# Patient Record
Sex: Female | Born: 1940 | Race: White | Hispanic: No | Marital: Married | Smoking: Former smoker
Health system: Southern US, Community
[De-identification: ages and names within clinical notes are randomized; demographics above are authoritative.]

## PROBLEM LIST (undated history)

## (undated) DIAGNOSIS — G912 (Idiopathic) normal pressure hydrocephalus: Secondary | ICD-10-CM

## (undated) DIAGNOSIS — R51 Headache: Secondary | ICD-10-CM

## (undated) DIAGNOSIS — M069 Rheumatoid arthritis, unspecified: Secondary | ICD-10-CM

## (undated) DIAGNOSIS — G4486 Cervicogenic headache: Secondary | ICD-10-CM

## (undated) DIAGNOSIS — R269 Unspecified abnormalities of gait and mobility: Principal | ICD-10-CM

## (undated) DIAGNOSIS — R42 Dizziness and giddiness: Secondary | ICD-10-CM

## (undated) DIAGNOSIS — F32A Depression, unspecified: Secondary | ICD-10-CM

## (undated) DIAGNOSIS — E785 Hyperlipidemia, unspecified: Secondary | ICD-10-CM

## (undated) DIAGNOSIS — M5481 Occipital neuralgia: Secondary | ICD-10-CM

## (undated) DIAGNOSIS — F329 Major depressive disorder, single episode, unspecified: Secondary | ICD-10-CM

## (undated) DIAGNOSIS — M869 Osteomyelitis, unspecified: Secondary | ICD-10-CM

## (undated) DIAGNOSIS — I10 Essential (primary) hypertension: Secondary | ICD-10-CM

## (undated) DIAGNOSIS — M47812 Spondylosis without myelopathy or radiculopathy, cervical region: Secondary | ICD-10-CM

## (undated) HISTORY — DX: Hyperlipidemia, unspecified: E78.5

## (undated) HISTORY — DX: Dizziness and giddiness: R42

## (undated) HISTORY — DX: Headache: R51

## (undated) HISTORY — DX: Unspecified abnormalities of gait and mobility: R26.9

## (undated) HISTORY — PX: LUMBAR SPINE SURGERY: SHX701

## (undated) HISTORY — PX: REPLACEMENT TOTAL KNEE: SUR1224

## (undated) HISTORY — DX: Major depressive disorder, single episode, unspecified: F32.9

## (undated) HISTORY — DX: (Idiopathic) normal pressure hydrocephalus: G91.2

## (undated) HISTORY — DX: Depression, unspecified: F32.A

## (undated) HISTORY — DX: Occipital neuralgia: M54.81

## (undated) HISTORY — PX: OVARY SURGERY: SHX727

## (undated) HISTORY — DX: Cervicogenic headache: G44.86

## (undated) HISTORY — PX: CERVICAL SPINE SURGERY: SHX589

## (undated) HISTORY — DX: Rheumatoid arthritis, unspecified: M06.9

## (undated) HISTORY — PX: VENTRICULO-PERITONEAL SHUNT PLACEMENT / LAPAROSCOPIC INSERTION PERITONEAL CATHETER: SUR1040

## (undated) HISTORY — DX: Essential (primary) hypertension: I10

## (undated) HISTORY — DX: Osteomyelitis, unspecified: M86.9

## (undated) HISTORY — PX: JOINT REPLACEMENT: SHX530

## (undated) HISTORY — DX: Spondylosis without myelopathy or radiculopathy, cervical region: M47.812

---

## 2008-12-01 ENCOUNTER — Ambulatory Visit: Payer: Self-pay | Admitting: Cardiology

## 2008-12-02 ENCOUNTER — Inpatient Hospital Stay (HOSPITAL_COMMUNITY): Admission: EM | Admit: 2008-12-02 | Discharge: 2008-12-14 | Payer: Self-pay | Admitting: Emergency Medicine

## 2008-12-03 ENCOUNTER — Ambulatory Visit: Payer: Self-pay | Admitting: Infectious Diseases

## 2008-12-05 ENCOUNTER — Ambulatory Visit: Payer: Self-pay | Admitting: Physical Medicine & Rehabilitation

## 2008-12-05 ENCOUNTER — Encounter (INDEPENDENT_AMBULATORY_CARE_PROVIDER_SITE_OTHER): Payer: Self-pay | Admitting: Internal Medicine

## 2008-12-06 ENCOUNTER — Encounter (INDEPENDENT_AMBULATORY_CARE_PROVIDER_SITE_OTHER): Payer: Self-pay | Admitting: Neurosurgery

## 2008-12-14 ENCOUNTER — Inpatient Hospital Stay (HOSPITAL_COMMUNITY)
Admission: RE | Admit: 2008-12-14 | Discharge: 2008-12-28 | Payer: Self-pay | Admitting: Physical Medicine & Rehabilitation

## 2008-12-22 ENCOUNTER — Ambulatory Visit: Payer: Self-pay | Admitting: Physical Medicine & Rehabilitation

## 2010-07-08 ENCOUNTER — Encounter: Admission: RE | Admit: 2010-07-08 | Discharge: 2010-07-08 | Payer: Self-pay | Admitting: Neurosurgery

## 2010-07-23 ENCOUNTER — Inpatient Hospital Stay (HOSPITAL_COMMUNITY): Admission: RE | Admit: 2010-07-23 | Discharge: 2010-07-25 | Payer: Self-pay | Admitting: Neurosurgery

## 2010-09-30 ENCOUNTER — Encounter
Admission: RE | Admit: 2010-09-30 | Discharge: 2010-09-30 | Payer: Self-pay | Source: Home / Self Care | Attending: Neurosurgery | Admitting: Neurosurgery

## 2010-12-25 LAB — BASIC METABOLIC PANEL
BUN: 18 mg/dL (ref 6–23)
CO2: 29 mEq/L (ref 19–32)
Calcium: 9.5 mg/dL (ref 8.4–10.5)
Chloride: 102 mEq/L (ref 96–112)
Creatinine, Ser: 1.01 mg/dL (ref 0.4–1.2)
GFR calc Af Amer: >60 mL/min (ref >60)
GFR calc non Af Amer: 54 mL/min — ABNORMAL LOW (ref >60)
Glucose, Bld: 96 mg/dL (ref 70–99)
Potassium: 4.8 mEq/L (ref 3.5–5.1)
Sodium: 138 mEq/L (ref 135–145)

## 2010-12-25 LAB — CBC
HCT: 33.8 % — ABNORMAL LOW (ref 36.0–46.0)
Hemoglobin: 11 g/dL — ABNORMAL LOW (ref 12.0–15.0)
MCH: 28.7 pg (ref 26.0–34.0)
MCHC: 32.5 g/dL (ref 30.0–36.0)
MCV: 88.3 fL (ref 78.0–100.0)
Platelets: 233 10*3/uL (ref 150–400)
RBC: 3.83 MIL/uL — ABNORMAL LOW (ref 3.87–5.11)
RDW: 14.4 % (ref 11.5–15.5)
WBC: 7.6 10*3/uL (ref 4.0–10.5)

## 2010-12-25 LAB — URINE MICROSCOPIC-ADD ON

## 2010-12-25 LAB — URINALYSIS, ROUTINE W REFLEX MICROSCOPIC
Bilirubin Urine: NEGATIVE
Glucose, UA: NEGATIVE mg/dL
Hgb urine dipstick: NEGATIVE
Ketones, ur: NEGATIVE mg/dL
Nitrite: NEGATIVE
Protein, ur: NEGATIVE mg/dL
Specific Gravity, Urine: 1.028 (ref 1.005–1.030)
Urobilinogen, UA: 0.2 mg/dL (ref 0.0–1.0)
pH: 5.5 (ref 5.0–8.0)

## 2010-12-25 LAB — SURGICAL PCR SCREEN
MRSA, PCR: NEGATIVE
Staphylococcus aureus: NEGATIVE

## 2010-12-25 LAB — GLUCOSE, CAPILLARY: Glucose-Capillary: 117 mg/dL — ABNORMAL HIGH (ref 70–99)

## 2011-01-05 ENCOUNTER — Other Ambulatory Visit: Payer: Self-pay | Admitting: Neurosurgery

## 2011-01-05 DIAGNOSIS — M542 Cervicalgia: Secondary | ICD-10-CM

## 2011-01-08 ENCOUNTER — Ambulatory Visit
Admission: RE | Admit: 2011-01-08 | Discharge: 2011-01-08 | Disposition: A | Discharge status: Home / Self Care | Payer: Medicare Other | Source: Ambulatory Visit | Attending: Neurosurgery | Admitting: Neurosurgery

## 2011-01-08 DIAGNOSIS — M542 Cervicalgia: Secondary | ICD-10-CM

## 2011-01-13 ENCOUNTER — Other Ambulatory Visit: Payer: Self-pay | Admitting: Orthopedic Surgery

## 2011-01-13 ENCOUNTER — Encounter (HOSPITAL_COMMUNITY): Payer: Medicare Other | Attending: Orthopedic Surgery

## 2011-01-13 DIAGNOSIS — Z79899 Other long term (current) drug therapy: Secondary | ICD-10-CM | POA: Insufficient documentation

## 2011-01-13 DIAGNOSIS — Z01812 Encounter for preprocedural laboratory examination: Secondary | ICD-10-CM | POA: Insufficient documentation

## 2011-01-13 DIAGNOSIS — I10 Essential (primary) hypertension: Secondary | ICD-10-CM | POA: Insufficient documentation

## 2011-01-13 DIAGNOSIS — Y831 Surgical operation with implant of artificial internal device as the cause of abnormal reaction of the patient, or of later complication, without mention of misadventure at the time of the procedure: Secondary | ICD-10-CM | POA: Insufficient documentation

## 2011-01-13 DIAGNOSIS — E785 Hyperlipidemia, unspecified: Secondary | ICD-10-CM | POA: Insufficient documentation

## 2011-01-13 DIAGNOSIS — M069 Rheumatoid arthritis, unspecified: Secondary | ICD-10-CM | POA: Insufficient documentation

## 2011-01-13 DIAGNOSIS — T8450XA Infection and inflammatory reaction due to unspecified internal joint prosthesis, initial encounter: Secondary | ICD-10-CM | POA: Insufficient documentation

## 2011-01-13 LAB — URINALYSIS, ROUTINE W REFLEX MICROSCOPIC
Bilirubin Urine: NEGATIVE
Glucose, UA: NEGATIVE mg/dL
Ketones, ur: NEGATIVE mg/dL
Nitrite: NEGATIVE
Protein, ur: NEGATIVE mg/dL
Specific Gravity, Urine: 1.027 (ref 1.005–1.030)
pH: 6 (ref 5.0–8.0)

## 2011-01-13 LAB — COMPREHENSIVE METABOLIC PANEL
AST: 15 U/L (ref 0–37)
Albumin: 3 g/dL — ABNORMAL LOW (ref 3.5–5.2)
Alkaline Phosphatase: 91 U/L (ref 39–117)
CO2: 29 mEq/L (ref 19–32)
Chloride: 102 mEq/L (ref 96–112)
Creatinine, Ser: 0.8 mg/dL (ref 0.4–1.2)
GFR calc Af Amer: >60 mL/min (ref >60)
GFR calc non Af Amer: >60 mL/min (ref >60)
Glucose, Bld: 97 mg/dL (ref 70–99)
Potassium: 4.6 mEq/L (ref 3.5–5.1)
Total Bilirubin: 0.5 mg/dL (ref 0.3–1.2)
Total Protein: 6.8 g/dL (ref 6.0–8.3)

## 2011-01-13 LAB — CBC
HCT: 33.4 % — ABNORMAL LOW (ref 36.0–46.0)
Hemoglobin: 10.1 g/dL — ABNORMAL LOW (ref 12.0–15.0)
MCH: 25 pg — ABNORMAL LOW (ref 26.0–34.0)
MCHC: 30.2 g/dL (ref 30.0–36.0)
MCV: 82.7 fL (ref 78.0–100.0)
RBC: 4.04 MIL/uL (ref 3.87–5.11)
RDW: 15.3 % (ref 11.5–15.5)

## 2011-01-13 LAB — SURGICAL PCR SCREEN
MRSA, PCR: NEGATIVE
Staphylococcus aureus: NEGATIVE

## 2011-01-13 LAB — URINE MICROSCOPIC-ADD ON

## 2011-01-13 LAB — APTT: aPTT: 32 seconds (ref 24–37)

## 2011-01-13 LAB — PROTIME-INR: INR: 1.08 (ref 0.00–1.49)

## 2011-01-21 ENCOUNTER — Inpatient Hospital Stay (HOSPITAL_COMMUNITY)
Admission: RE | Admit: 2011-01-21 | Discharge: 2011-01-26 | DRG: 464 | Disposition: A | Discharge status: Skilled Nursing Facility | Payer: Medicare Other | Source: Ambulatory Visit | Attending: Orthopedic Surgery | Admitting: Orthopedic Surgery

## 2011-01-21 DIAGNOSIS — Z982 Presence of cerebrospinal fluid drainage device: Secondary | ICD-10-CM

## 2011-01-21 DIAGNOSIS — G912 (Idiopathic) normal pressure hydrocephalus: Secondary | ICD-10-CM | POA: Diagnosis present

## 2011-01-21 DIAGNOSIS — E7800 Pure hypercholesterolemia, unspecified: Secondary | ICD-10-CM | POA: Diagnosis present

## 2011-01-21 DIAGNOSIS — I10 Essential (primary) hypertension: Secondary | ICD-10-CM | POA: Diagnosis present

## 2011-01-21 DIAGNOSIS — Z96659 Presence of unspecified artificial knee joint: Secondary | ICD-10-CM

## 2011-01-21 DIAGNOSIS — IMO0002 Reserved for concepts with insufficient information to code with codable children: Secondary | ICD-10-CM | POA: Diagnosis present

## 2011-01-21 DIAGNOSIS — Z9889 Other specified postprocedural states: Secondary | ICD-10-CM

## 2011-01-21 DIAGNOSIS — B951 Streptococcus, group B, as the cause of diseases classified elsewhere: Secondary | ICD-10-CM | POA: Diagnosis present

## 2011-01-21 DIAGNOSIS — Y831 Surgical operation with implant of artificial internal device as the cause of abnormal reaction of the patient, or of later complication, without mention of misadventure at the time of the procedure: Secondary | ICD-10-CM | POA: Diagnosis present

## 2011-01-21 DIAGNOSIS — T8450XA Infection and inflammatory reaction due to unspecified internal joint prosthesis, initial encounter: Principal | ICD-10-CM | POA: Diagnosis present

## 2011-01-21 DIAGNOSIS — D62 Acute posthemorrhagic anemia: Secondary | ICD-10-CM | POA: Diagnosis not present

## 2011-01-21 DIAGNOSIS — M069 Rheumatoid arthritis, unspecified: Secondary | ICD-10-CM | POA: Diagnosis present

## 2011-01-21 LAB — CBC
HCT: 27.7 % — ABNORMAL LOW (ref 36.0–46.0)
Hemoglobin: 9 g/dL — ABNORMAL LOW (ref 12.0–15.0)
MCH: 26.1 pg (ref 26.0–34.0)
MCV: 80.3 fL (ref 78.0–100.0)
RBC: 3.45 MIL/uL — ABNORMAL LOW (ref 3.87–5.11)
WBC: 16.3 10*3/uL — ABNORMAL HIGH (ref 4.0–10.5)

## 2011-01-22 ENCOUNTER — Inpatient Hospital Stay (HOSPITAL_COMMUNITY): Payer: Medicare Other

## 2011-01-22 DIAGNOSIS — T8450XA Infection and inflammatory reaction due to unspecified internal joint prosthesis, initial encounter: Secondary | ICD-10-CM

## 2011-01-22 LAB — BASIC METABOLIC PANEL
BUN: 11 mg/dL (ref 6–23)
BUN: 27 mg/dL — ABNORMAL HIGH (ref 6–23)
CO2: 24 mEq/L (ref 19–32)
CO2: 29 mEq/L (ref 19–32)
CO2: 30 mEq/L (ref 19–32)
Calcium: 8.2 mg/dL — ABNORMAL LOW (ref 8.4–10.5)
Calcium: 8.5 mg/dL (ref 8.4–10.5)
Calcium: 9 mg/dL (ref 8.4–10.5)
Chloride: 99 mEq/L (ref 96–112)
Chloride: 97 mEq/L (ref 96–112)
Creatinine, Ser: 0.79 mg/dL (ref 0.4–1.2)
Creatinine, Ser: 0.95 mg/dL (ref 0.4–1.2)
GFR calc Af Amer: >60 mL/min (ref >60)
GFR calc Af Amer: >60 mL/min (ref >60)
GFR calc non Af Amer: >60 mL/min (ref >60)
GFR calc non Af Amer: >60 mL/min (ref >60)
Glucose, Bld: 179 mg/dL — ABNORMAL HIGH (ref 70–99)
Glucose, Bld: 124 mg/dL — ABNORMAL HIGH (ref 70–99)
Sodium: 135 mEq/L (ref 135–145)
Sodium: 137 mEq/L (ref 135–145)
Sodium: 138 mEq/L (ref 135–145)

## 2011-01-22 LAB — URINE CULTURE
Colony Count: NO GROWTH
Culture: NO GROWTH
Special Requests: NEGATIVE

## 2011-01-22 LAB — CBC
HCT: 27 % — ABNORMAL LOW (ref 36.0–46.0)
HCT: 23 % — ABNORMAL LOW (ref 36.0–46.0)
Hemoglobin: 8.5 g/dL — ABNORMAL LOW (ref 12.0–15.0)
Hemoglobin: 10.4 g/dL — ABNORMAL LOW (ref 12.0–15.0)
Hemoglobin: 10.5 g/dL — ABNORMAL LOW (ref 12.0–15.0)
Hemoglobin: 7.6 g/dL — CL (ref 12.0–15.0)
Hemoglobin: 8.5 g/dL — ABNORMAL LOW (ref 12.0–15.0)
MCH: 25.4 pg — ABNORMAL LOW (ref 26.0–34.0)
MCHC: 31.5 g/dL (ref 30.0–36.0)
MCHC: 33.2 g/dL (ref 30.0–36.0)
MCHC: 34.1 g/dL (ref 30.0–36.0)
MCHC: 34.5 g/dL (ref 30.0–36.0)
MCHC: 34.7 g/dL (ref 30.0–36.0)
MCV: 80.6 fL (ref 78.0–100.0)
MCV: 93.7 fL (ref 78.0–100.0)
MCV: 92 fL (ref 78.0–100.0)
MCV: 92.2 fL (ref 78.0–100.0)
MCV: 96.8 fL (ref 78.0–100.0)
Platelets: 151 10*3/uL (ref 150–400)
RBC: 2.38 MIL/uL — ABNORMAL LOW (ref 3.87–5.11)
RBC: 2.6 MIL/uL — ABNORMAL LOW (ref 3.87–5.11)
RBC: 3.28 MIL/uL — ABNORMAL LOW (ref 3.87–5.11)
RDW: 16.1 % — ABNORMAL HIGH (ref 11.5–15.5)
WBC: 11.3 10*3/uL — ABNORMAL HIGH (ref 4.0–10.5)
WBC: 10.2 10*3/uL (ref 4.0–10.5)
WBC: 14.3 10*3/uL — ABNORMAL HIGH (ref 4.0–10.5)

## 2011-01-22 LAB — DIFFERENTIAL
Basophils Relative: 0 % (ref 0–1)
Eosinophils Absolute: 0 10*3/uL (ref 0.0–0.7)
Eosinophils Relative: 0 % (ref 0–5)
Monocytes Absolute: 0.5 10*3/uL (ref 0.1–1.0)
Monocytes Relative: 5 % (ref 3–12)

## 2011-01-22 LAB — CROSSMATCH

## 2011-01-23 LAB — PROTIME-INR: Prothrombin Time: 17.1 seconds — ABNORMAL HIGH (ref 11.6–15.2)

## 2011-01-23 LAB — TYPE AND SCREEN: Unit division: 0

## 2011-01-23 LAB — BASIC METABOLIC PANEL
BUN: 9 mg/dL (ref 6–23)
CO2: 27 mEq/L (ref 19–32)
Calcium: 8.2 mg/dL — ABNORMAL LOW (ref 8.4–10.5)
Creatinine, Ser: 0.75 mg/dL (ref 0.4–1.2)
GFR calc non Af Amer: >60 mL/min (ref >60)
Glucose, Bld: 141 mg/dL — ABNORMAL HIGH (ref 70–99)
Sodium: 135 mEq/L (ref 135–145)

## 2011-01-23 LAB — CBC
MCH: 27.1 pg (ref 26.0–34.0)
MCHC: 32.7 g/dL (ref 30.0–36.0)
MCV: 83 fL (ref 78.0–100.0)
Platelets: 222 10*3/uL (ref 150–400)
RBC: 3.76 MIL/uL — ABNORMAL LOW (ref 3.87–5.11)
RDW: 16.3 % — ABNORMAL HIGH (ref 11.5–15.5)

## 2011-01-23 NOTE — Op Note (Signed)
[name], [name]                ACCOUNT NO.:  [id number]  MEDICAL RECORD NO.:  [id number]           PATIENT TYPE:  I  LOCATION:  [id number]                         FACILITY:  [facility]  PHYSICIAN:  [name], M.D.    DATE OF BIRTH:  09-14-1941  DATE OF PROCEDURE:  01/21/2011 DATE OF DISCHARGE:                              OPERATIVE REPORT   PREOPERATIVE DIAGNOSES: 1. Infected left total knee arthroplasty. 2. Retained intramedullary nail, left femur.  POSTOPERATIVE DIAGNOSES: 1. Infected left total knee arthroplasty. 2. Infected intramedullary nail, left femur.  PROCEDURE: 1. Resection arthroplasty of left knee with placement of antibiotic-     impregnated spacer. 2. Removal of IM nail, left femur.  SURGEON:  [name], MD  ASSISTANT:  Alexzandrew L. Dara Lords, PA-C  ANESTHESIA:  General.  ESTIMATED BLOOD LOSS:  200.  DRAIN:  Hemovac x1 to the left knee and 1 to the left hip.  TOURNIQUET TIME:  42 minutes at 300 mmHg for the left knee procedure.  COMPLICATIONS:  None.  CONDITION:  Stable to Recovery.  BRIEF CLINICAL NOTE:  [name] is a 70 year old female with complex history in regard to her left knee.  She had left total knee arthroplasty done in [place] several years ago.  She ended up developing an infection approximately year and half ago and initially had an I and D and liner exchange.  She eventually had apparent resolution of the infection, but then upon moving to [place] had recurrence of drainage from the knee last fall.  She had a superficial I and D and then had a deep I and D of the joint in January of this year. She presented to me in second opinion earlier this year and I felt that the joint was in fact that needed resection arthroplasty.  She presents now for that operation.  In addition, she had a previous femur fracture and had a long antegrade femoral nail going down to her knee.  I felt that it was essential to remove this nail also  for the purpose of eradicating infection and also in order to allow [place] to place intramedullary rod in her canal and we did a reimplantation.  PROCEDURE IN DETAIL:  After successful administration of general anesthetic, a tourniquet was placed high on her left thigh and her left lower extremity was prepped and draped in usual sterile fashion. Extremity was wrapped in an Esmarch and tourniquet inflated to 300 mmHg. Midline incision was made with a 10 blade through subcutaneous tissue to the level of the extensor mechanism.  Fresh blade was used to make a medial parapatellar arthrotomy.  Puss was encountered in the joint.  The specimen was sent for Gram stain culture and sensitivity.  I did a thorough debridement of the inflamed synovium.  We recreated median and lateral gutters.  There was a large amount of pus present in a pocket in the medial gutter, adjacent to the interlock from her IM nail. Laterally, she also had a small plate with 2 screws and there was a lot of pus adjacent to that plate.  We removed the plate and the  2 screws. We also removed the anterior and lateral interlocks of the IM nail.  I then flexed the knee to address the knee.  Prior to this, I had elevated soft tissue on the proximal medial tibia into the semimembranosus bursa. Laterally, I also elevated tissue with attention being paid to avoid any patellar tendon on tibial tubercle.  Patella was subluxed laterally and the knee flexed 90 degrees.  The tibial polyethylene was removed. Posterior, there was a fair amount of debris and that was all debrided. We then used an osteotome to disrupt the interface between the femoral component and bone and this was easily removed, consistent with a femoral component being loose.  I then subluxed tibial forward, placed retractors, and used a saw and osteotome to disrupt interface between the tibial component and bone.  This was also removed relatively easily, but it was not  grossly loose.  We then removed the cement from the tibial side.  The tibial bone looked healthy.  Femoral side had some metal staining on the bone and that was removed.  There was pus tracking down the I and M nail.  This was all thoroughly debrided with thorough synovectomy performed.  The 3 batches of gentamicin-impregnated cement was then mixed and while that was occurring, the wound was copiously irrigated with 3 L saline and pulsatile lavaged.  I also removed the polyethylene patella by utilizing a saw to disrupt the undersurface and then removing the plastic pegs with a rongeur.  There was some granulation tissue present in the bone and that was removed.  I completed irrigating with the saline and then once the cement was ready for molding, the spacer was molded to fit the extension space of the knee.  As the cement hardened, I placed it into the extension space and a very good varus-valgus stability.  The wound was further irrigated with saline and then the arthrotomy closed over Hemovac drain with interrupted #1 PDS.  The tourniquet was then released at total time of 42 minutes.  Subcutaneous was then closed with interrupted 2-0 Vicryl and skin with staples.  The drain was hooked to suction, incision cleaned and dried, and a bulky sterile dressing applied.  We then broke down the drapes and placed her in a semi-lateral position with a bump under her hip.  I isolated the bandage by placing a stockinette over and then we prepped and draped the thigh in usual sterile fashion.  This was very prepped like a total hip so we would have free movement of the leg. Previous lateral incision was then utilized, skin cut with a 10 blade through subcutaneous tissue.  The incision was made posterolaterally over the hip.  We cut through the fascia lata to the lateral cortex of the hip and the greater trochanter. I then palpated the proximal interlock screw and it was removed.  The bone at the  greater trochanter was overgrown where the nail was inserted.  I was able to remove that with a rongeur and gain access to the nail.  We then threaded in the Winquist extraction device and we were able to remove the nail utilizing an extraction mallet.  There was puss tracking out the canal as the nail was being removed.  Once that was out, then we thoroughly irrigated the canal with about a liter of saline solution.  The wound was irrigated with another liter saline. The fascia lata was then closed over Hemovac drain with interrupted #1 Vicryl, subcutaneous closed with #1-0  and #2-0 Vicryl, and skin closed with staples.  The incision was cleaned and dried and a bulky sterile dressing applied.  She was then placed into a knee immobilizer, awakened, and transferred to Recovery in stable condition.     [name], M.D.     FA/MEDQ  D:  01/21/2011  T:  01/22/2011  Job:  [id number]  Electronically Signed by [name] M.D. on 01/23/2011 09:02:45 AM

## 2011-01-24 LAB — CBC
MCH: 27.7 pg (ref 26.0–34.0)
MCHC: 33 g/dL (ref 30.0–36.0)
Platelets: 202 10*3/uL (ref 150–400)
RDW: 16.6 % — ABNORMAL HIGH (ref 11.5–15.5)

## 2011-01-24 LAB — PROTIME-INR
INR: 1.69 — ABNORMAL HIGH (ref 0.00–1.49)
Prothrombin Time: 20.1 seconds — ABNORMAL HIGH (ref 11.6–15.2)

## 2011-01-26 LAB — ANAEROBIC CULTURE

## 2011-01-26 LAB — PROTIME-INR: Prothrombin Time: 20.8 seconds — ABNORMAL HIGH (ref 11.6–15.2)

## 2011-01-27 LAB — URINALYSIS, ROUTINE W REFLEX MICROSCOPIC
Glucose, UA: NEGATIVE mg/dL
Protein, ur: >300 mg/dL — AB

## 2011-01-27 LAB — BASIC METABOLIC PANEL
BUN: 30 mg/dL — ABNORMAL HIGH (ref 6–23)
CO2: 24 mEq/L (ref 19–32)
CO2: 25 mEq/L (ref 19–32)
Calcium: 8.4 mg/dL (ref 8.4–10.5)
Chloride: 100 mEq/L (ref 96–112)
Chloride: 97 mEq/L (ref 96–112)
Creatinine, Ser: 0.74 mg/dL (ref 0.4–1.2)
GFR calc Af Amer: >60 mL/min (ref >60)
GFR calc non Af Amer: >60 mL/min (ref >60)
GFR calc non Af Amer: >60 mL/min (ref >60)
Glucose, Bld: 141 mg/dL — ABNORMAL HIGH (ref 70–99)
Potassium: 4.4 mEq/L (ref 3.5–5.1)
Potassium: 4.8 mEq/L (ref 3.5–5.1)
Sodium: 133 mEq/L — ABNORMAL LOW (ref 135–145)
Sodium: 135 mEq/L (ref 135–145)

## 2011-01-27 LAB — CULTURE, BLOOD (ROUTINE X 2)
Culture: NO GROWTH
Culture: NO GROWTH
Culture: NO GROWTH

## 2011-01-27 LAB — CBC
HCT: 32 % — ABNORMAL LOW (ref 36.0–46.0)
HCT: 32.3 % — ABNORMAL LOW (ref 36.0–46.0)
HCT: 33.3 % — ABNORMAL LOW (ref 36.0–46.0)
Hemoglobin: 11 g/dL — ABNORMAL LOW (ref 12.0–15.0)
Hemoglobin: 11.4 g/dL — ABNORMAL LOW (ref 12.0–15.0)
Hemoglobin: 11.9 g/dL — ABNORMAL LOW (ref 12.0–15.0)
MCHC: 34.3 g/dL (ref 30.0–36.0)
MCHC: 34.4 g/dL (ref 30.0–36.0)
MCHC: 34.5 g/dL (ref 30.0–36.0)
MCHC: 34.8 g/dL (ref 30.0–36.0)
MCV: 94 fL (ref 78.0–100.0)
MCV: 94.3 fL (ref 78.0–100.0)
MCV: 94.6 fL (ref 78.0–100.0)
MCV: 94.7 fL (ref 78.0–100.0)
MCV: 95.9 fL (ref 78.0–100.0)
Platelets: 110 10*3/uL — ABNORMAL LOW (ref 150–400)
Platelets: 125 10*3/uL — ABNORMAL LOW (ref 150–400)
RBC: 3.43 MIL/uL — ABNORMAL LOW (ref 3.87–5.11)
RBC: 3.47 MIL/uL — ABNORMAL LOW (ref 3.87–5.11)
RBC: 3.65 MIL/uL — ABNORMAL LOW (ref 3.87–5.11)
RDW: 13.7 % (ref 11.5–15.5)
RDW: 13.8 % (ref 11.5–15.5)
RDW: 14 % (ref 11.5–15.5)
WBC: 18.6 10*3/uL — ABNORMAL HIGH (ref 4.0–10.5)
WBC: 9.3 10*3/uL (ref 4.0–10.5)

## 2011-01-27 LAB — COMPREHENSIVE METABOLIC PANEL
ALT: 38 U/L — ABNORMAL HIGH (ref 0–35)
AST: 27 U/L (ref 0–37)
AST: 31 U/L (ref 0–37)
Albumin: 2.2 g/dL — ABNORMAL LOW (ref 3.5–5.2)
CO2: 25 mEq/L (ref 19–32)
Calcium: 8.9 mg/dL (ref 8.4–10.5)
Calcium: 9.6 mg/dL (ref 8.4–10.5)
Creatinine, Ser: 1.03 mg/dL (ref 0.4–1.2)
GFR calc Af Amer: >60 mL/min (ref >60)
GFR calc Af Amer: >60 mL/min (ref >60)
GFR calc non Af Amer: 53 mL/min — ABNORMAL LOW (ref >60)
Sodium: 135 mEq/L (ref 135–145)
Total Protein: 6 g/dL (ref 6.0–8.3)
Total Protein: 6 g/dL (ref 6.0–8.3)

## 2011-01-27 LAB — CULTURE, ROUTINE-ABSCESS

## 2011-01-27 LAB — HIV ANTIBODY (ROUTINE TESTING W REFLEX): HIV: NONREACTIVE

## 2011-01-27 LAB — SEDIMENTATION RATE: Sed Rate: 100 mm/hr — ABNORMAL HIGH (ref 0–22)

## 2011-01-27 LAB — URINE MICROSCOPIC-ADD ON

## 2011-01-27 LAB — PROTIME-INR: INR: 1.1 (ref 0.00–1.49)

## 2011-01-27 LAB — URINE CULTURE: Culture: NO GROWTH

## 2011-01-27 LAB — ANAEROBIC CULTURE

## 2011-01-27 LAB — TSH: TSH: 1.117 u[IU]/mL (ref 0.350–4.500)

## 2011-01-27 LAB — APTT: aPTT: 34 seconds (ref 24–37)

## 2011-02-09 NOTE — H&P (Signed)
NAMEJENILLE, [name]                ACCOUNT NO.:  [id number]  MEDICAL RECORD NO.:  [id number]           PATIENT TYPE:  I  LOCATION:  [id number]                         FACILITY:  [facility]  PHYSICIAN:  [name], M.D.    DATE OF BIRTH:  1941-08-10  DATE OF ADMISSION:  01/21/2011 DATE OF DISCHARGE:                             HISTORY & PHYSICAL   CHIEF COMPLAINT:  Infected left total knee.  HISTORY OF PRESENT ILLNESS:  The patient is a 70 year old female who has been seen as a second opinion by Dr. [name] for complicated history and course associated with her left knee.  She is actually the mother of Dr. Lajean Saver, one of our ER Department physicians.  She came in for opinion of her left knee.  Her history is extensive dating back to 2005 back in [place] where she had her original left total knee arthroplasty done.  Unfortunately, she sustained a femur fracture and had a left femoral nailing back in 2007.  It appears that she had a nonunion and had a redo nailing done in December 2008.  Her course even became more complicated when she developed a cervical abscess in and about her neck requiring I and D and surgical fixation.  At that point, records show that she had a group B strep agalactiae with bacteremia. Unfortunately that progressed and infected the total knee and hardware. She underwent irrigation and debridement of the left knee in August 2010 with repeat I and D in March 2011 and a repeat I and D in January 2012. Unfortunately, she continues to have problems with the wound and drainage.  She is seen by Dr. Wynelle Link and felt that she has a grossly infected left total knee arthroplasty.  Felt she would best be served by undergoing removal of the hardware including the total knee, the small plate laterally, and also the antegrade femoral nail for resection and placement of antibiotic spacer.  Risks and benefits of this treatment plan, stage II resection, surgery discussed  with the patient.  She elects to proceed with surgery.  ALLERGIES:  PENICILLIN, SULFA, and METHOTREXATE.  CURRENT MEDICATIONS:  Amlodipine, labetalol, multivitamin, biotin, Ambien, Remeron, Skelaxin, VESIcare, Percocet, pantoprazole, Boniva, Cymbalta.  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  PAST SURGICAL HISTORY:  Lumbar laminectomy in 1989, oophorectomy in 2003, left total knee prosthesis in 2005, femoral rodding of the fracture in the left in 2007 with a redo insertion in left femur in December 2008, cervical laminectomy in February 2010.  By her record, she underwent an I and D of the knee in August 2010 and I and D of the knee in March 2011, I and D of the knee in 2012 of January.  FAMILY HISTORY:  Father with heart failure.  Mother with ovarian cancer. Brother with a brain tumor.  SOCIAL HISTORY:  Married, retired Therapist, sports, past smoker.  No alcohol.  She does have a caregiver lined up.  She does want to look into a [facility] rehab  following surgery.  She does have a living will, healthcare power of attorney.  REVIEW OF SYSTEMS:  GENERAL:  No fevers, chills, night sweats.  NEURO: She has had some occasional headaches.  She has normal pressure hydrocephalus with some balance issues.  RESPIRATORY:  No shortness breath, productive cough, or hemoptysis.  CARDIOVASCULAR:  Chest pain. No orthopnea.  GASTROINTESTINAL:  No nausea, vomiting, diarrhea, constipation.  GENITOURINARY:  No dysuria, hematuria, or discharge. MUSCULOSKELETAL:  Knee pain.  PHYSICAL EXAMINATION:  VITAL SIGNS:  Pulse 68, respirations 12, blood pressure 132/72. GENERAL:  A 70 year old white female, well nourished, well developed, no acute stress.  She is alert, oriented, and cooperative.  She is accompanied by her  husband.  She is a good historian. HEENT:  Normocephalic, atraumatic.  Pupils are round and reactive.  EOMs intact. NECK:  Supple. CHEST:  Clear. HEART:  Regular rate and rhythm without murmur.  S1, S2 noted. ABDOMEN:  Soft, nontender.  Bowel sounds present. RECTAL/BREASTS/GENITALIA:  Not done, not part of present illness. EXTREMITIES:  Left knee, no effusion.  Range of motion 5-100.  No instability.  She does have a small area on the anterolateral portion, just lateral to the incision, looks like a previous subcutaneous abscess there with some intermittent drainage.  IMPRESSION:  Infected left total knee arthroplasty.  PLAN:  The patient was admitted to [facility] to undergo resection arthroplasty, removal of all of her hardware, and surgery will be performed by Dr. [name].  We will get infectious disease consult postoperatively.     Alexzandrew L. Dara Lords, P.A.C.   ______________________________ [name], M.D.    ALP/MEDQ  D:  01/22/2011  T:  01/22/2011  Job:  [id number]  cc:   [name]. [name], M.D. Fax: [phone]  Rita Ohara Fax: [phone]  Electronically Signed by Mickel Crow P.A.C. on 01/28/2011 12:48:01 PM Electronically Signed by [name] M.D. on 02/09/2011 07:10:49 AM

## 2011-02-09 NOTE — Discharge Summary (Signed)
[name], DEJOSEPH                ACCOUNT NO.:  [id number]  MEDICAL RECORD NO.:  [id number]           PATIENT TYPE:  I  LOCATION:  [id number]                         FACILITY:  [facility]  PHYSICIAN:  [name], M.D.    DATE OF BIRTH:  1941-09-21  DATE OF ADMISSION:  01/21/2011 DATE OF DISCHARGE:  01/26/2011                        DISCHARGE SUMMARY - REFERRING   ADMITTING DIAGNOSES: 1. Infected left total knee arthroplasty. 2. Normal-pressure hydrocephalus with previous shunt. 3. History of depression. 4. Hypertension. 5. Hypercholesterolemia. 6. Previous history of left femur fracture in 2007 requiring surgical     fixation. 7. Rheumatoid arthritis. 8. Degenerative disk disease.  DISCHARGE DIAGNOSES: 1. Infected left total knee arthroplasty status post resection     arthroplasty of left knee, replacement of antibiotic impregnated     spacer. 2. Retained intramedullary nailing of left femur status post removal     of intramedullary nailing, left femur. 3. Postop acute blood loss anemia. 4. Status post transfusion without sequelae. 5. Normal-pressure hydrocephalus with previous shunt. 6. History of depression. 7. Hypertension. 8. Hypercholesterolemia. 9. Previous history of left femur fracture in 2007 requiring surgical     fixation. 10.Rheumatoid arthritis. 11.Degenerative disk disease.  PROCEDURE:  January 21, 2011, resection arthroplasty of left total knee with placement of antibiotic impregnated spacer and also removal of the intramedullary nailing of the left femur.  Surgeon, [name], M.D. Assistant, [name], P.A.C.  Tourniquet time 42 minutes.  CONSULTATIONS:  Infectious disease, [name]. [name], M.D.  BRIEF HISTORY:  The patient is a 70 year old female with a complex history with regard to her left knee.  She had a total knee placed and unfortunately had an infection develop several years later, which was felt to be due to a bacteremia from a  previous cervical abscess.  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  LABORATORY DATA:  CBC on admission showed hemoglobin low at 10.1, hematocrit of 33.4, white cell count normal at 7.1, platelets 270. PT/INR 14.2 and 1.08 with PTT of 32.  Chem panel on admission, BUN of 3. Remaining Chem panel within normal limits.  Preop UA did show some trace blood, 0-2 red cells, rare squamous, otherwise negative.  Blood group type A negative.  Nasal swabs were negative for staph aureus, negative for MRSA.  Serial CBCs were followed.  Hemoglobin dropped down to 9 and went to as low as 8.5, given 2 units, back up to 10.2.  Last noted hemoglobin and hematocrit 9.6 and 29.1.  White count went up from 17.9- 16.3, came back down and was back down to 13.2.  Serial protimes were followed starting on postop day #2.  Serial protimes followed.  Last noted PT/INR for Coumadin protocol with 20.8 and 1.77.  Serial BMETs were followed for 48 hours.  Electrolytes remained within normal limits. Glucose did go up from 97-179, back down to 141.  Fluid cultures taken at time of surgery, fluid smear showed no organisms, however, the current culture grew out group B Streptococcus agalactiae with pansensitivity, anaerobic culture,  no organism seen on the smear, no anaerobes isolated.  It was a preliminary culture and not final at time of dictation.  X-rays, chest x-ray dated October 16, 2010 well-defined linear densities left lung base, most consistent with scarring atelectasis, thoracic aortic ectasia versus mild dilatation.  EKG on the chart dated October 16, 2010 normal sinus rhythm, normal EKG.  No previous EKG available.  HOSPITAL COURSE:  The patient was admitted to [facility], taken to OR, underwent above-stated procedure without complication.  The patient tolerated the procedure well, later transferred to  the recovery room of orthopedic floor, started on p.o. and IV analgesic pain control following surgery, placed on IV antibiotics.  We did call an infectious disease consult to assist with the infectious management  of this patient.  Hemoglobin had dropped down to 8.5.  She was low and felt that she would require blood.  Therefore, we went ahead and gave her 2 units. We ordered a PICC line.  She was placed back on her home meds.  The patient was seen in consultation by Dr. [name], and he recommended continuing the Ancef and added rifampin.  Initially, she was placed on Xarelto for DVT prophylaxis, but due to the interaction with rifampin we switched over to Coumadin.  She remained on Coumadin protocol.  So, her IV antibiotics were Ancef with p.o. Rocephin.  The cultures did grow out group B strep which was pansensitive and felt to be safe to continue with Rocephin.  Her Hemovac was pulled.  Her hemoglobin was back up to 10 after the blood.  She was tolerating the meds well and started just doing total assist pivoting.  It was felt that she would require skilled facility.  She remained in house throughout the weekend.  She was seen on Saturday and doing well except for pain.  We increased the oxycodone dosage to get better control on Saturday.  By Sunday, she was doing a little bit better, stated she was pretty weak and it was felt to be due to the medications.  Her blood pressure was stable.  Pulse was stable.  We are waiting on bed availability.  She was seen back on rounds on Monday.  We were waiting on final bed offers.  She was looking at [facility] at [place].  There was a possibility that she may be able to go there today.  We are making arrangements and as long as a bed is available, she will be transferred at that time.  DISCHARGE/PLAN: 1. The patient to be transferred to [facility] if bed     available on today, January 26, 2011. 2. Discharge diagnoses,  please see above.  DISCHARGE MEDICATIONS: 1. Colace 100 mg p.o. b.i.d. 2. Remeron 7.5 mg p.o. q.h.s. 3. Labetalol 50 mg p.o. b.i.d. 4. Norvasc 5 mg p.o. daily. 5. Rifampin 300 mg p.o. daily. 6. Ancef 1 gram IV every 6 hours scheduled via PICC line. 7. Coumadin protocol.  Please titrate the Coumadin level for target     INR between 2.0 and 3.0.  She needs to be on Coumadin for 3 weeks     from the date of surgery. 8. Robaxin 500 mg p.o. q.6-8 h. p.r.n. spasm. 9. Restoril 15-30 mg p.o. q.h.s. p.r.n. sleep. 10.Tylenol 325 one or two every 4-6 hours as needed for mild pain,     temperature or headache. 11.Oxycodone 10 mg-20 mg every 4-6 hours as needed for moderate to     severe pain.  DIET:  Heart-healthy cardiac diet.  ACTIVITY:  She is allowed to be weightbearing as tolerated to the left lower extremity. No motion, no bending of the knee.  She does not have a total knee.  She has an antibiotic spacer, so there is no motion or bending to the knee.  She is able to do straight leg raises within the knee immobilizer.  Gait training, ambulation, ADLs, strengthening exercises.  However, no bending of the knee.  The patient may start showering.  The leg must be supported the whole time.  FOLLOWUP:  She needs to follow up with Dr. Wynelle Link in the office 2 weeks from the date of surgery.  Please contact the office at [phone] to make arrangements and appointment for this patient to follow up at [facility], Dr. Wynelle Link.  DISPOSITION:  [facility].  CONDITION UPON DISCHARGE:  Improving.  At time of dictation, we are awaiting final bed offer.     Alexzandrew L. Dara Lords, P.A.C.   ______________________________ [name], M.D.    ALP/MEDQ  D:  01/26/2011  T:  01/26/2011  Job:  [id number]  cc:   [name]. [name], M.D. Fax: [phone]  Rita Ohara Fax: [phone]  Electronically Signed by Mickel Crow P.A.C. on 01/28/2011 12:48:12 PM Electronically  Signed by [name] M.D. on 02/09/2011 07:10:45 AM

## 2011-02-18 ENCOUNTER — Telehealth: Payer: Self-pay | Admitting: *Deleted

## 2011-02-20 NOTE — Telephone Encounter (Signed)
Spoke with husband and confirmed that pt's appt date. Lorne Skeens, RN

## 2011-02-24 NOTE — Discharge Summary (Signed)
[name], [name]                ACCOUNT NO.:  [id number]   MEDICAL RECORD NO.:  [id number]          PATIENT TYPE:  IPS   LOCATION:  [id number]                         FACILITY:  [facility]   PHYSICIAN:  [name], M.D.DATE OF BIRTH:  June 19, 1941   DATE OF ADMISSION:  12/14/2008  DATE OF DISCHARGE:                               DISCHARGE SUMMARY   DISCHARGE DIAGNOSES:  1. Cervical epidural abscess with myelopathy.  2. Positive blood cultures of gram-positive cocci with intravenous      Rocephin until January 06, 2009.  3. Pain management.  4. Rheumatoid arthritis.  5. Depression.  6. Hypertension.  7. History of normal-pressure hydrocephalus.  8. Anemia.  9. Subcutaneous heparin for deep vein thrombosis prophylaxis.   PROCEDURE:  Status post anterior cervical decompression, C4-5 and C5-6  on December 04, 2008 per Dr. Ashok [name].   This is a 70 year old white female with a history of rheumatoid  arthritis  and recent diagnosis of normal-pressure hydrocephalus 6  months ago.  The patient is from [place] and was visiting her son in  [place] who is a local physician when she developed progressive upper  extremity weakness and joint pain.  Cranial CT scan did show possible  normal-pressure hydrocephalus, but no acute abnormalities.  Admitted  02/21 for further evaluation.  Noted white blood cell count of 13,200.  Blood cultures of gram-positive cocci.  There was question of  exacerbation of her rheumatoid arthritis.  Placed on steroids with  little relief and now had developed  altered mental status changes.  Sedimentation rate of 100.  Urine culture negative.  MRI of the cervical  spine showed a cervical epidural abscess.  Underwent ACDF of cervical 4-  5 and 5-6 on 02/23 per Dr. Ashok [name] for epidural abscess with  myelopathy.  TEE 02/25 with ejection fraction 65%, no vegetation.  Placed on intravenous Rocephin 02/25 times 4 weeks total.  Follow-up MRI  cervical spine 03/02  with no abscess present.  Subcutaneous heparin was  initiated for deep vein thrombosis prophylaxis.  Hospital course with  depression and follow-up per psychiatry services, Dr. Rhona [name] and  placed on Celexa, as well as low-dose Remeron.  Monitoring of blood  pressure with Norvasc and labetalol added to regimen 03/01.  Attending  therapy with endurance limited.  She was admitted for a comprehensive  rehab program.   PAST MEDICAL HISTORY:  Rheumatoid arthritis, normal pressure  hydrocephalus diagnosed approximately 6 months ago, she had refused any  surgical intervention, hyperlipidemia, hypertension.   PAST SURGICAL HISTORY:  She has had a prior lumbar laminectomy.   Denies alcohol or tobacco.   ALLERGIES:  Penicillin and sulfa.   SOCIAL HISTORY:  Lives with husband in [place].  Son is an emergency  room physician in [place], [place].  Husband can assist on  discharge.  She is a retired Equities trader in 2005.  They live in a  one level home with one to two steps to entry.   FUNCTIONAL HISTORY:  Prior to admission, she was independent with a  cane.  She was driving.  Functional status  upon admission to rehab  services:  She was +2 total assist bed mobility, the patient 50% with  encouragement, +2 total assist for transfers, ambulating 8 feet with a  rolling walker, +2 total assist.   MEDICATIONS PRIOR TO ADMISSION:  Enbrel 25 mg subcutaneously two times a  week, Forteo 20 mcg subcutaneously daily, Zocor 20 mg daily, Voltaren 50  mg twice daily, calcium twice daily, multivitamin daily, Bioten 2.5 mg  daily, vitamin D daily, Darvocet as needed, Skelaxin 400 mg as needed  for spasms, Norvasc 5 mg daily, and fish oil.   PHYSICAL EXAMINATION:  Blood pressure 85/49, pulse 73, temperature 97.4,  respirations 18, oxygen saturations 93% on room air.  This was an alert  female in no acute distress, names person, place, situation, and date of  birth.  Deep tendon reflexes  were hypoactive.  Sensation decreased to  light touch distally.  Calves remained cool without swelling, erythema,  nontender.  No specific joint swelling, surgical site healing nicely.  Noted limited grasp bilaterally.   REHABILITATION HOSPITAL COURSE:  The patient was admitted to inpatient  rehab services with therapies initiated on a 3-hour daily basis  consisting of physical therapy, occupational therapy, and rehabilitation  nursing.  The following issues were addressed during the patient's  rehabilitation stay:  Pertaining to Mrs. Oran's cervical epidural  abscess, she had undergone ACDF, cervical 4-5 and 5-6 on 02/23 per Dr.  Christella [name] at [phone].  She was progressing nicely in all areas of her  therapy.  She remained on intravenous Rocephin for gram-positive cocci  blood culture with antibiotics to be completed January 06, 2009.  A PICC  line was in place.  Home health nurse had been arranged with [facility] to continue to administer antibiotics appropriately.  Dr. Princess [name] at [phone] had been contacted to follow the  patient upon arrival to [place] for ongoing monitoring.  Pain control  ongoing with MS Contin 30 mg twice daily and Voltaren 50 mg twice daily.  She was using Darvocet as needed for pain, as well as Skelaxin 800 mg  every 8 hours as needed for spasms.  Noted long history of rheumatoid  arthritis.  She was on prednisone therapy with slow taper, last  decreased to 20 mg twice daily on 03/17 and this would continue to be  tapered as directed.  As pertaining to her rheumatoid arthritis, she  would follow up with her primary care [name] in [place].  She had a  documented history of depression.  She was seen by Dr. Rhona [name] of  Psychiatry Services for a long hospital stay.  She remained on Celexa,  as well as Remeron therapy and emotional support provided.  Her mood and  spirits continued to improve as her mobility improved.  Her  blood  pressures were monitored while on Norvasc and labetalol with diastolic  pressures of 123456.  She denied any dizziness or headache.  She did have  a documented history of normal-pressure hydrocephalus.  This was without  issue to her rehabilitation stay.  She remained on iron supplement for  some mild anemia of 10.9, hematocrit 31.4, platelet 151,000.  Subcutaneous heparin ongoing for deep vein thrombosis prophylaxis  throughout her rehab course with calves remaining cool without any  swelling, erythema, and nontender.   Weekly collaborative interdisciplinary team conferences were held to  discuss the patient's estimated length of stay and family teaching for  any barriers to discharge.  She was overall minimum to  moderate assist  overall for her mobility, minimum to moderate assist overall for  activities of daily living.  She had occasional incontinence of bladder  at night time with routine toileting.  She was able to communicate her  needs without issue.  Home health therapies would be ongoing and  provided by [facility].   Latest labs 03/16 showed a urine culture no growth 03/17 with sodium  137, potassium 4.4, BUN 27, creatinine 0.79, hemoglobin 10.9, hematocrit  31.4, WBC of 11.3, platelet 151,000.   DISCHARGE MEDICATIONS:  Norvasc 10 mg p.o. daily, Celexa 30 mg p.o.  daily, Voltaren 50 mg p.o. b.i.d., labetalol 300 mg p.o. b.i.d., Remeron  7.5 mg p.o. daily, MS Contin 30 mg p.o. b.i.d., Protonix 40 mg p.o.  daily, MiraLax 17 grams with 8 ounces of water daily, hold for loose  stools, prednisone 20 mg p.o. b.i.d., this would be tapered as directed,  Trinsicon 1 capsule twice daily, Skelaxin 800 mg p.o. every 8 hours  as  needed for spasms, Darvocet-N 100 one tablet every 6 hours as needed for  pain, dispense 90 tablets, and intravenous Rocephin 1 gram intravenously  daily until January 06, 2009 and stop.   DIET:  Regular.   SPECIAL INSTRUCTIONS:  Home  health nurse per [facility] Go to administer  intravenous antibiotic as directed until January 06, 2009 and stop.  Her  PICC line could be discontinued after antibiotics are completed.  She  should follow with Dr. Princess [name] at [phone] as directed,  fax number [phone], Dr. Ashok [name] at [phone] would remain  available as needed, Dr. Orene Desanctis,  infectious disease at [phone], Dr.  Alger Simons at the [facility] at [phone].  The  patient was advised no drinking, no driving, no smoking.   She was medically stable at time of discharge to return back to [place]  with home health therapies being arranged with [facility].      Lauraine Rinne, P.A.      [name], M.D.  Electronically Signed    DA/MEDQ  D:  12/27/2008  T:  12/27/2008  Job:  [id number]   cc:   [name], M.D.  Alison Murray, M.D.  [name], M.D.

## 2011-02-24 NOTE — Consult Note (Signed)
[name], [name]                ACCOUNT NO.:  [id number]   MEDICAL RECORD NO.:  [id number]          PATIENT TYPE:  INP   LOCATION:                               FACILITY:  [facility]   PHYSICIAN:  Felizardo Hoffmann, M.D.  DATE OF BIRTH:  11/23/1940   DATE OF CONSULTATION:  12/12/2008  DATE OF DISCHARGE:  12/14/2008                                 CONSULTATION   Ms. Cicco is having improved mood now.  She has a will to live.  She  also has the will to go through her general medical treatment to gain  recovery.   She does still have waxing and waning mild confusion, but not disturbing  and she is cooperative with all bedside care.  There is no agitation or  combativeness.  She does not have any hallucinations or delusions.   REVIEW OF SYSTEMS:  GASTROINTESTINAL:  No constipation with the Remeron.   MENTAL STATUS EXAM:  Ms. [name] is alert.  Her eye contact is good.  She  has slight decreased attention and slight decreased concentration.  Her  affect is mildly flat at baseline, but with a broad and appropriate  range as the conversation continues.  Her mood is mildly depressed.  She  is oriented to all spheres except she misses the day of the month by  one.  Her memory function is intact for recent days.  Her speech is  soft.  However, there is normal prosody.  No dysarthria.  Thought  process is logical, coherent, goal-directed.  No looseness of  associations.  Thought content, please see the history above.  No  thoughts of suicide.  No thoughts of harming others.  No hallucinations  or delusions.  Insight is good.  Judgment is still questionable.   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  Major  depression.   RECOMMENDATIONS:  We would continue the Remeron augmentation trial with  caution about constipation as a side effect.   We would continue in ego supportive environment.      Felizardo Hoffmann, M.D.  Electronically Signed     JW/MEDQ  D:  12/28/2008  T:  12/29/2008  Job:  [id number]

## 2011-02-24 NOTE — H&P (Signed)
NAMEEASTON, GALES                ACCOUNT NO.:  [id number]   MEDICAL RECORD NO.:  [id number]          PATIENT TYPE:  INP   LOCATION:  1860                         FACILITY:  [facility]   PHYSICIAN:  Estelle June, MD       DATE OF BIRTH:  February 03, 1941   DATE OF ADMISSION:  12/01/2008  DATE OF DISCHARGE:                              HISTORY & PHYSICAL   CHIEF COMPLAINT:  Weakness and joint pain.   HISTORY OF PRESENT ILLNESS:  This is a 70 year old white female, who has  had rheumatoid arthritis for 25 years, recent diagnosis of normal  pressure hydrocephalus about 6 months ago, coming in with increasing  weakness, especially of her extremities and wrist pain and inability to  ambulate secondary to weakness.  The patient tells me that she has had  similar episodes in the past, but none so severe.  The patient denies  any new medications.  She tells me that usually her symptoms resolve by  taking tapering dose of prednisone; however, this time symptoms did not  resolve prompting her to come to the ER.  The patient tells me her  diagnosis of normal pressure hydrocephalus was about 6 months ago for  gait abnormality; however, she has refused to undergo shunt due to the  fact that she does not have any urinary incontinence and does not have  dementia.  The patient denies any fevers, chills, shortness of breath,  or muscle pain.   PAST MEDICAL HISTORY:  Rheumatoid arthritis and NPH.   SOCIAL HISTORY:  The patient denies any alcohol, smoking, drug use.  Lives with her husband in [place].  Father had history of arthritis.   ALLERGIES:  PENICILLIN and SULFA.   REVIEW OF SYSTEMS:  Please refer to the HPI, otherwise 14-point review  of system is negative.   PHYSICAL EXAMINATION:  VITAL SIGNS:  Temperature of 98.5, blood pressure  144/65, pulses is 88, and respirations 16.  The patient is sating 100%  on room air.  GENERAL:  She is in no acute distress, awake and alert, talking in full  sentences.  NECK:  Wearing a neck collar, so I am not able to assess JVD.  HEENT:  There is no icterus, pallor.  LUNGS:  Clear to auscultation.  CARDIOVASCULAR:  Regular rate and rhythm.  EXTREMITIES:  On upper extremity exam, strength is 5-/5 restricted  secondary to pain.  The patient has bilateral wrist pain and redness.  Lower extremities also strength 5-/5.  Gait was deferred by the patient  secondary to weakness.   Imaging CT head was done, which showed ventriculomegaly with no acute  pathology.   LABORATORY DATA:  ESR of 100.  Sodium 137, potassium 4.9, chloride 103,  bicarb 25, BUN 23, creatinine 1.03, glucose 130, alk phos 114, AST 31,  and ALT 45.   ASSESSMENT AND PLAN:  Plan is to admit the patient, start high-dose  steroids.  We will get a total CK level with labs in the morning.  Also,  we will get an EMG to rule out myositis and her Rheumatology consult in  the morning.  We will continue her Norvasc.  We will hold her  simvastatin and Skelaxin since this can possibly worsen her weakness.      Estelle June, MD  Electronically Signed     RP/MEDQ  D:  12/02/2008  T:  12/02/2008  Job:  [id number]

## 2011-02-24 NOTE — Op Note (Signed)
[name], [name]                ACCOUNT NO.:  [id number]   MEDICAL RECORD NO.:  [id number]          PATIENT TYPE:  INP   LOCATION:  3007                         FACILITY:  [facility]   PHYSICIAN:  Ashok Pall, M.D.     DATE OF BIRTH:  1941/08/04   DATE OF PROCEDURE:  DATE OF DISCHARGE:                               OPERATIVE REPORT   PREOPERATIVE DIAGNOSES:  1. Cervical epidural abscess.  2. Cervical myelopathy.   SURGEON:  Ashok Pall, MD   ASSISTANT:  Earleen Newport, MD   PROCEDURES:  1. Anterior cervical decompression, C4-C5 and C5-C6.  2. Arthrodesis C4-C5 and C5-C6 with 5-mm allograft at C5-C6, and 6-mm      allograft at C4-C5.  3. Anterior instrumentation two-level anterior plate S[employer] Vect[employer]      plate.   COMPLICATIONS:  None.   ANESTHESIA:  General endotracheal.   INDICATIONS:  [name] is a 70 year old woman who presented to the  hospital for weakness, general fatigue, and severe pain, presumed to be  a rheumatoid arthritis exacerbation.  She then went on to spike a fever  and had positive blood cultures for Gram-positive cocci in pairs.  An  MRI was ordered because she also had a week-long history of cervical  pain.  The MRI revealed cervical epidural fluid collection presumed to  be an abscess.  I was consulted and recommended that [name] be taken  to the operating room for decompression.   OPERATIVE NOTE:  [name] was brought to the operating room,  intubated, and placed under general anesthetic without difficulty.  Her  head was positioned in a neutral fashion on horseshoe headrest.  Her  neck was prepped and she was draped in a sterile fashion.  I infiltrated  5 mL of 0.5% lidocaine with 1:200,000 strength epinephrine into the  cervical region.  I opened the skin with a #10 blade and took this down  to the level of the platysma.  I then opened the platysma in a  horizontal fashion using Metzenbaum scissors.  I was able to then  dissect both  bluntly and sharply to create an avascular corridor to the  cervical spine.  I localized the space, but prior to that was able to  see frank pus emanating from the disk space at what turned out to be C4-  C5.  I dissected over the anterior cervical spine and reflected the  longus colli muscles bilaterally.  I placed a self-retaining retractor.  I then proceeded with operative decompression.  I opened the disk space  at C5-C6 and at C4-C5 using a #15 blade.  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  Diskectomies were  performed at C5-C6 using combination of the drill, Kerrison punches, and  curettes.  The bone itself was not particularly soft.  I had good  bleeding from the C5-C6 surfaces at that disk space.  I then used a  drill and was able to then create space at that level and for  decompression of the spinal canal and the C6 nerve roots  bilaterally.  I  did not see much in the way of purulent material at the C5-C6 space, but  did remove some thickened posterior longitudinal ligament.   I prepared for the arthrodesis by evening the surfaces of C5 and C6.  I  then placed a 5-mm structural allograft after sizing the space.  When  that was done, I then turned my attention to the C4-C5 space.   I completed decompression at the C4-C5 space and at the C5 roots by  again using the drill, pituitary rongeurs, Kerrison punches to remove  what was now clearly an infected disk and very soft material.  At the C4  bone, I had to drill a few millimeters if I was able to get some bloody  return from the bony surfaces and it was obvious that the infection had  originated at this disk space.  I drilled also the C5 bone to make sure  again at the spinal cord and spinal canal were well decompressed.  I  removed much more what would be considered as granulation tissue at the  C4-C5 space and undercut the surfaces aggressively to effect a full  decompression.  I then  after decompressing the canal, sized the space.   I prepared for the arthrodesis by again drilling the surfaces and making  them as straight as possible to accept a graft.  With the pre-mentioned  size of structural allograft, I placed a 6-mm graft in the space.  I  then was able to turned my attention to the instrumentation.   I along with Dr. Ellene Route placed an anterior plate at D34-534, D34-534 using 6  screws.  This was a Biomedical engineer made by [employer].  X-ray showed that  the plate, plug, and screws were in good position.  I then closed the  wound in layered fashion using Vicryl sutures.  I used Dermabond for  sterile dressing.  The platysma and subcuticular layers were  reapproximated with Vicryl sutures.           ______________________________  Ashok Pall, M.D.     KC/MEDQ  D:  12/07/2008  T:  12/07/2008  Job:  [id number]

## 2011-02-24 NOTE — Consult Note (Signed)
[name], [name]                ACCOUNT NO.:  [id number]   MEDICAL RECORD NO.:  [id number]          PATIENT TYPE:  INP   LOCATION:  3007                         FACILITY:  [facility]   PHYSICIAN:  Felizardo Hoffmann, M.D.  DATE OF BIRTH:  1940-11-30   DATE OF CONSULTATION:  12/10/2008  DATE OF DISCHARGE:                                 CONSULTATION   REQUESTING PHYSICIAN:  [facility].   REASON FOR CONSULTATION:  Depression.   HISTORY OF PRESENT ILLNESS:  [name] is a 70 year old female  admitted to the [facility] on December 01, 2008, for profound  weakness.   [name] has been experiencing greater than 3 weeks of progressive  depressed mood, low energy, difficulty concentrating, anhedonia.  She  has been telling her family that she does not want to live.  She also  has been extremely verbally withdrawn and on a regular basis remains in  her bed with eyes shut.   She has had a number of factors that are correlated with her symptoms  above.  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  She has undergone  surgery which included drainage of the abscess along with anterior  cervical decompression at C4-C6.   She has been left with quadriplegia involving greater weakness in the  upper extremities.   Psychosocial efforts at helping the patient have been non-effective.  Also, she has been on Celexa 20 mg daily which has recently been started  this hospitalization.   PAST PSYCHIATRIC HISTORY:  The patient's husband and son were  interviewed.  There is a history of prior depressive episodes.   In review of the past medical record, there is no data on prior  psychotropic treatment for psychiatric symptoms.   Regarding past symptoms of depression, she has had periods of social  withdrawal.  She had a femur fracture in the past which required  surgical intervention twice.  At that time she did develop the  desire to  die.  She was not treated with any psychotropics in the past.   She did undergo a period of mild alcohol intake without any known social  complications.  However, she began attending AA and has continued to be  a regular AA participant.   There is no known history of mania or hallucinations.   FAMILY PSYCHIATRIC HISTORY:  There is a female child with depression.  Also, the patient's sister has experienced depression.  The  psychotropics involved are unknown at this time.   SOCIAL HISTORY:  [name] lives with her husband in [place].  She  had recently traveled to [place] when she developed the epidural  abscess.  She then traveled to [facility] for treatment.  Her son is an  emergency physician in town.   She has 2 children.  She is a retired Therapist, sports.  She was functioning fully  prior to the epidural abscess including preparing meals for herself and  her husband.   PAST MEDICAL HISTORY:  Please see above.  Chronic rheumatoid arthritis,  normal pressure hydrocephalus  which was evaluated in the Fall of 2009.  [name] declined treatment which would have included a shunt.   MEDICATIONS:  The MAR is reviewed.  Celexa 20 mg daily was just started  this past week.   ALLERGIES:  1. PENICILLIN.  2. SULFA.   Electrocardiogram:  QTc 441 milliseconds on December 04, 2008.   LABORATORY DATA:  Sodium 133, BUN 24, creatinine 0.59, glucose 134.  WBC  18.6, hemoglobin 11, platelet count 228.   HIV and TSH unremarkable.   Head CT without contrast showed ventriculomegaly.   REVIEW OF SYSTEMS:  NEUROLOGIC:  [name] has been experiencing some  mild short-term recall difficulty and she has no insight into it;  however, her husband is aware of it.  She also has been having some  sleep-wake reversal and a shuffling gait.  She has had a fall with the  shuffling gait.  MUSCULOSKELETAL:  There has been chronic rheumatoid  arthritis with associated fatigue.  She has been on  significant pain  treatment after the surgery.  However, post surgery she is not  participating in any necessary treatment.  CARDIOVASCULAR:  No history  of coronary artery disease.  CONSTITUTIONAL:  HEAD, EYES, EARS, NOSE,  THROAT, MOUTH, RESPIRATORY, GASTROINTESTINAL, GENITOURINARY, SKIN,  HEMATOLOGIC LYMPHATIC, ENDOCRINE, METABOLIC:  All unremarkable.   EXAMINATION:  VITAL SIGNS:  Temperature 98.0, pulse 68, respiratory rate  19, blood pressure 137/55, O2 saturation on room air 93%.  GENERAL APPEARANCE:  [name] is an elderly female lying in the  supine position in her hospital bed with no abnormal involuntary  movements.  She remains symmetrically in position with her eyes closed,  her head forward with the neck brace intact.  Please see the mental  status exam.   MENTAL STATUS EXAM:  [name] does not respond to any questions  except when asked if she believes that a condition of major depression  can exist; she states, Yes, of course, with her eyes open and then she  closes her eyes.  At that time she does not have any dysarthria on  speech.  Her prosody is normal.  Her thought process was coherent at  that point.  She does not provide any data on other thought content,  memory recall, fund of knowledge or intelligence, abstraction, or mood.  Concentration is not assessable.  Insight and judgment are grossly  impaired. Affect is flat.   ASSESSMENT:  AXIS I:  1.  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  2.  296.33 major  depressive disorder, recurrent, severe.  3.  Rule out 293.00 delirium  not otherwise specified with partial delirium symptoms.  AXIS II:  Deferred.  AXIS III:  See past medical history.  AXIS IV:  General medical.  AXIS V:  Global Assessment of Functioning 30.   The undersigned discussed the indications, alternatives and adverse  effects of medications with Mr. Solesbee as well as including the  risk of  possible cardiac muscle damage and death from Dexedrine and potential  non-reversible abnormal involuntary movements, cardiac death, and  hyperglycemia with Zyprexa or Haldol.  The indications, alternatives and  adverse effects of Remeron as well as Celexa were discussed.  Mr. Erno  understands and wants to proceed as below.   RECOMMENDATIONS:  1. Would continue the Celexa trial at 20 mg p.o. every morning within      2 days.  2. If [name] continues with poor energy and  staying in bed, would      start Dexedrine 5 mg p.o. 2 times daily at 10 a.m. and 2 p.m.,      ensuring that the Dexedrine is given 1 hour before stomach-filling      or 2 hours after stomach-filling  3. If [name] develops any hallucinations, delusions or evidence      of thought disorganization would start Zyprexa at 5 mg Zydis p.o.      every 1800.  4. Would continue with low stimulation ego support with memory and      orientation cues in the room.   PRELIMINARY DISCHARGE PLANNING:  1. The psychiatric course recommended after discharge will depend upon      the patient's progress.  2. When utilizing the Celexa, would monitor for any nausea or other      gastrointestinal side effects.  3. If utilizing the Dexedrine, would monitor for tachycardia or other      cardiovascular adverse side effects.  4. If utilizing the Zyprexa, would monitor for rigidity or other      extrapyramidal side effects.      Felizardo Hoffmann, M.D.  Electronically Signed     JW/MEDQ  D:  12/11/2008  T:  12/11/2008  Job:  [id number]

## 2011-02-24 NOTE — Group Therapy Note (Signed)
[name], [name]                ACCOUNT NO.:  [id number]   MEDICAL RECORD NO.:  [id number]          PATIENT TYPE:  INP   LOCATION:  3007                         FACILITY:  [facility]   PHYSICIAN:  Kieth Brightly, MDDATE OF BIRTH:  Feb 27, 1941                                 PROGRESS NOTE   REASON FOR ADMISSION:  Weakness and joint pain, weakness in both upper extremities.   HOSPITAL COURSE:  So far, Ms. [name] is a 70 year old female with a prior history of  rheumatoid arthritis and normal-pressure hydrocephalus who lives with  her husband in [place], and who is the mother of one of our emergency  room doctors at [facility], Dr. Lajean [name].  She was visiting  her son here when she was found to have more weakness in the upper  extremities and so she was admitted for upper extremity weakness for  further evaluation.   Part of the evaluation, the patient had MRI of the cervical spine on  December 03, 2008.  She was admitted on December 02, 2008.  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   In view of the above-mentioned findings, with the weakness in both upper  extremities, the patient had a neurosurgery consult called.  Seen by Dr.  Christella Noa, who opined that the patient has epidural abscess and will need  surgical decompression of the site.  The patient was taken to the OR.  After that, the patient was observed in the neurosurgical ICU and then  patient had surgery.  The patient had a neurosurgical decompression on  December 04, 2008, and the patient was then sent to the neurosurgical  unit for further convalescence.   The patient was followed by neurosurgery in the unit afterwards.  She  was found to be clinically stable for transfer to the floor and so was  transferred to the floor.  After which the patient has been staying on  the floor under telemetry.   The patient has been evaluated by me over the past 1 week.  She has been  making minimal progress.  Her left upper extremity strength is slowly  improving.  The right upper extremity strength is not that much as  compared to the left.  She has had arthritis for which she was on  Inderal, which has been stopped in view of the existing issues going on.  She was put on steroids, Solu-Medrol, at the time of surgery by Dr.  Christella Noa to avoid adrenal insufficiency as per what he told me.  The  patient has been slowly tapered off the prednisone.  I plan to leave the  prednisone at the lower dose of 10-20 mg.   The patient's depression was evaluated.  She was started on Celexa.  Wilburn Mylar, that is December 09, 2008, late evening she confided to me in  private without any family members around that she does not  have a  desire to live.  She wants to let it go.  She had a monotonous voice and  she was very emotional.  In view of this depression, which is expected  in this setting, she had sudden loss of power in both upper extremities.  I called Dr. Rhona Raider for consult.   The patient was seen by Dr. Rhona Raider today and he has opined that it is  depressive disorder not otherwise specified and mood disorder not  otherwise specified, as well as major depression.  He also opined as  well partial delirium.  The patient was already on Celexa 20 mg daily.  He added Remeron 7.5 mg q.h.s. at night if she does not sleep.  He  suggested that he will be following the patient after.   The patient was seen today.  She is slightly more cheerful and she said  that she was happy to talk to Dr. Rhona Raider today.  She only says that  she is feeling hungry and she was about to eat when I went in.  No other  complaints.  No fevers.   OBJECTIVE:  VITAL SIGNS:  Temperature 98, T-max of 98.5, heart rate 68,  respiration  19, blood pressure 137/55.  GENERAL:  The patient is awake, alert, slightly depressed but still  opens her eyes when I called her and responds to verbal responses.  Not  tearful.  Not emotional today.  HEAD AND NECK:  The anterior neck wound has healed.  No hematomas seen.  Pupils reactive to light.  No bleeding seen oral and nasal mucosa.  No  oral thrush seen.  CHEST:  Bilateral air entry is clear.  No JVD and no bruits.  CARDIAC:  S1, S2, regular.  No murmur.  ABDOMEN:  Soft, nontender and no organomegaly.  Bowel sounds positive.  EXTREMITIES:  SCDs on both legs.  No edema.  No tenderness or pain in  the calves.  CNS:  Right upper extremity power is 0/5 to 1/5 at the level of the  wrist, the elbow, and the shoulder.  Left upper extremity at the level  of the elbow and the wrist is 3/5, and at the level of the shoulder it  is still 1/5 to 2/5.  Lower extremities both sides are 3/5.  Plantar and  flexor.  Sensation intact in both upper extremities and lower  extremities.  There is slightly increased pain on trying to move the  upper both extremities.  There is some tenderness to touch also in both  upper extremities, more on the right, which is possibly due to  hyperesthesia of the nerves secondary to injury at the level of the  cervical spine.  PSYCHIATRIC:  She is depressed.  No hallucinations.  No delusions.  Mildly anhedonic.  Not agitated.  Not suicidal.  There is a PICC line  placed in the right upper extremity.   LABORATORY DATA:  No new labs today.  Summary of previous labs:  Blood cultures on her  cultures on December 02, 2008, both sets grew Group B streptococcus  agalactiae.  This was noted in both sets of blood cultures, which is  sensitive to all antibiotics including ampicillin, clindamycin,  levofloxacin, penicillin, and vancomycin.  The patient was started on  vancomycin initially.  Cultures from the cervical abscess also grew  streptococcus agalactiae,  which has the same sensitivity pattern.  The  patient was continued on vancomycin until 25 and then was switched to  Rocephin as per  the instructions of infection diseases as there is no  staphylococcus aureus found at any point in any cultures.  The last set  of blood tests was done on December 08, 2008.  WBC 80.6 which is high  due to the presence of steroid therapy.  Hemoglobin 11, hematocrit 32.3,  platelets 228,000.  BMP sodium 133, potassium 4.8, chloride 100, bicarb  26, glucose 134, BUN 34, creatinine 0.59, calcium 8.3.   Repeat blood cultures on December 06, 2008 and December 07, 2008 are  negative for any growth.  The patient is continued on IV antibiotics.   HIV testing is negative.   ASSESSMENT/PLAN:  1. 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  Blood cultures growing staphylococcus      agalactiae, Group B Streptococcus.  The plan is seen by a      infectious disease, Dr. Orene Desanctis, who has advised that the patient can      be converted to Rocephin 1 gram daily.  The patient has a PICC line      placed.  Rocephin to be given for a total of 4 weeks from February      27 onward. The patient is getting Rocephin every day.  There are no      fevers further.  Repeat blood cultures 2 sets on 2 different days      have come back negative.  Transesophageal echocardiogram and 2-D      echocardiogram were done which did not reveal any vegetations.      There is no evidence of any significant bowel abnormalities seen on      the echocardiogram.  There is evidence of mild diastolic      dysfunction in the left ventricle with increased wall thickness and      severe aortic regurgitation was seen.  There is a slight increase      in the pulmonary artery systolic pressure.  2. Rheumatoid arthritis.  The patient previously has been on steroids.      Currently the patient was on  Solu-Medrol until 2 days ago and then      changed to prednisone 40 mg daily.  I will taper this prednisone 10      mg every other day and once the dose is 10 mg per day.  Will      continue 10 mg per day for continued suppression for rheumatoid      arthritis features.  3. Hypertension.  She has been started on amlodipine 10 mg p.o. daily,      and she has been put on labetalol 10 mg p.o. b.i.d.  Blood      pressures are stable now, so will continue the same medication.      Can increase labetalol as required.  4. Depression.  The patient has been seen by Dr. Rhona Raider and has      been diagnosed with major depression with depressive disorder not      otherwise specified, as well as delirium.  Has been continued on      Celexa 20 mg daily and Remeron 7.5 mg p.o. q.h.s. p.r.n. insomnia.  5. Constipation.  Continue MiraLax 17 grams p.o. b.i.d.  The patient's      intake is slightly low.  With improvement in her depression and      clinical condition, the patient is expected to  improve her p.o.      intake.  6. Rehab.  The patient has been seen by PT/OT in acute rehab setting.      The patient has pain with movements of the upper extremities, which      is slowly coming off.  Acute rehab consult has stated that once the      patient's pain is off and if she is able to tolerate her acute      rehab well, they will be trying to transfer to the acute rehab      setting that she will continue the rest of her rehab for bilateral      upper extremity weakness.  7. Deep vein thrombosis prophylaxis with heparin.  8. Gastrointestinal prophylaxis with Protonix.  The patient needs to      be on Protonix.  She is on steroids.   DISPOSITION:  1. Soon, I expect the patient to be eligible for acute rehab and then      continue rehab.  2. The patient has a label as allergy to PENICILLIN on admission.  The      patient has been given ceftriaxone for the past 4 days.  There is      no reaction seen, so  the patient is not allergic to ceftriaxone.      Kieth Brightly, MD  Electronically Signed     UT/MEDQ  D:  12/10/2008  T:  12/10/2008  Job:  [id number]   cc:   Ashok Pall, M.D.  Fax: [phone]   Felizardo Hoffmann, M.D.

## 2011-02-24 NOTE — H&P (Signed)
[name], [name]                ACCOUNT NO.:  [id number]   MEDICAL RECORD NO.:  [id number]          PATIENT TYPE:  IPS   LOCATION:  [id number]                         FACILITY:  [facility]   PHYSICIAN:  Meredith Staggers, M.D.DATE OF BIRTH:  10/24/1940   DATE OF ADMISSION:  12/14/2008  DATE OF DISCHARGE:                              HISTORY & PHYSICAL   CHIEF COMPLAINT:  Tetraplegia and pain.   HISTORY OF PRESENT ILLNESS:  This is a 70 year old white female with  history of rheumatoid arthritis with a recent diagnosis of normal-  pressure hydrocephalous 6 months ago.  She is here from [place] and was  visiting her son in [place], who is a Field seismologist.  She developed  progressive upper extremity weakness and joint pain.  Head CT did show  possible NPH but no acute abnormalities.  She was admitted on December 02, 2008, for further evaluation and workup noted WBCs at 13,000 and  blood culture was positive for gram-positive cocci.  There was a  question of exacerbation of her RA and the patient was placed on  steroids with little relief.  She developed altered mental status  changes.  Sed rate was 100.  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  She underwent ACDF of C4-C5, C5-C6 on December 04, 2008, by Dr. Christella Noa for the abscess and myelopathy.  TEE was notable  for ejection fraction of 65% and no vegetation.  She was placed on IV  Rocephin on December 06, 2008, for 4 weeks total.  Followup MRIs of the  C-spine 3-2 showed no abscess present.  She was placed on subcu heparin  for DVT prophylaxis.  Hospital course was complicated by depression, and  the patient was placed on Celexa by Psychiatry with some improvement.  Pressures have been an issue and Norvasc and labetalol have been added  to the regimen recently.  The patient has had problems with pain as well  as fluctuating endurance.  Rehab was consulted initially on December 05, 2008, and then followup was made  today prior to the admission and it was  felt that she could benefit from an inpatient rehab stay.   Review of systems notable for constipation, weakness, numbness in the  extremities, although this is improved.  Her breathing is improved as  her appetite.  She is having pain generally still in her joints and the  neck.  She reports fair sleep.  Full review is in the written H and P.   Past medical history is positive for  1. RA.  2. Normal-pressure hydrocephalus.  3. Hyperlipidemia.  4. Hypertension.  5. Prior lumbar laminectomy.   Family history is negative.   SOCIAL HISTORY:  The patient lives with her husband in [place].  Son is  an ED physician here at [facility].  Husband can assist.  She  is retired Therapist, sports 2005.  She has 1-level house with 1-2 steps to enter.   ALLERGIES:  PENICILLIN and SULFA.   HOME MEDICATIONS:  Amaryl, Forteo, Zocor, Voltaren 50 mg b.i.d., calcium  b.i.d., multivitamin,  Biotin, vitamin D, Darvocet p.r.n., Skelaxin,  Norvasc, and fish oil.   LABORATORY DATA:  Hemoglobin 8.5, white count 14,000.  Sodium 138,  potassium 4.2, BUN 18, creatinine 0.6.   PHYSICAL EXAMINATION:  VITAL SIGNS:  Blood pressure is 85/49, pulse is  73, respiratory rate is 18, temperature 97.4.  GENERAL:  The patient is pleasant, alert and oriented x3.  HEENT:  Pupils equal, round, and reactive to light.  Ear, nose, and  throat exam is notable for wound over the anterior throat, which was  clean and intact.  Tongue is moist.  Dentition is fair to good.  NECK:  Supple without any JVD or lymphadenopathy.  CHEST:  Clear to auscultation bilaterally without wheezes, rales, or  rhonchi.  HEART:  Regular rate and rhythm without murmurs, rubs, or gallops.  ABDOMEN:  Soft, nontender.  Bowel sounds are positive.  SKIN:  As noted above.  MUSCULOSKELETAL:  Multiple rheumatoid changes in the feet as well as the  hand, wrist, and shoulders.  The patient has pain with resisted   movements of these and active movement of these joints.  NEUROLOGICAL:  Cranial nerves II through XII are grossly intact.  Reflexes are 1+.  Sensation really is grossly intact today.  I found no  obvious abnormalities.  Motor functions notable for 4/5 strength right  upper extremity, 3+/5 strength upper extremity, left lower extremity is  3+ to 4/5, and right lower extremity is 4/5.  She is a bit weaker  proximal in the legs, however.  Judgment, orientation, memory, and mood  are all within functional limits today.   POST-ADMISSION PHYSICIAN EVALUATION:  1. Functional deficit secondary to cervical epidural abscess with      myelopathy.  The patient's postoperative day #10 ACDF.  2. The patient is admitted to receive collaborative interdisciplinary      care between the physiatrist, rehab nursing staff, and therapy      team.  3. The patient's level of medical complexity and substantial therapy      needs in context of that medical necessity cannot be provided in a      lesser intensity of care.  4. The patient has experienced substantial functional loss from her      baseline.  Prior to arrival, the patient was independent.  At the      time of initial rehab screening, the patient was essentially with a      total max assist for basic mobility and self-care.  As of our most      recent therapy evaluation, she is mod assist for bed mobility, +2      max encouragement for the patient.  She is max and total assist for      basic transfers.  She has walked 8 feet with a rolling walker at      total assist (50-60%).  She is total assist for ADLs.  Based on the      patient's diagnosis, physical exam and functional history, she has      a potential for functional progress which are result in measurable      gains while inpatient rehab.  These gains will be a substantial and      practical use upon discharge to home with her husband in      facilitating mobility, self-care, etc.  Interim  changes in the      medical status since the preadmission screening are detailed in the      history of  present illness above.  5. Physiatrist will provide 24-hour management of the medical needs as      well as oversight of the therapy plan/treatment and provide      guidance as appropriate regarding interaction of the two.  Medical      problem list and plan are listed below.  6. 24-hour rehab nursing will assist in the management of the      patient's bowel and bladder needs.  She has substantial      constipation, which needs to be addressed.  They also help to      integrate therapy concept techniques, education, work on pain      management, safety awareness, medication administration, and skin      hygiene.  7. PT will assess and treat for lower extremity strength, mobility,      and range of motion as well as adaptive techniques, equipment,      endurance training, balance, and safety education with goals at a      supervision to occasional min assist level.  8. OT will assess and treat for upper extremity use ADLs, appropriate      splinting, and adaptive equipment and ADL training, safety      awareness, and family education with goals of min assist.  9. Case management and social worker will assess and treat for      psychosocial issues and discharge planning.  10.Team conference will be held weekly to assess progress towards      goals and to determine barriers at discharge.  11.The patient has demonstrated sufficient medical stability and      exercise capacity to tolerate at least 3 hours of therapy per day      at least 5 days per week.  12.Estimated length of stay is 3 weeks.  Prognosis fair to good.   MEDICAL PROBLEM LIST AND PLAN:  1. Pain control:  Continue MS Contin 30 mg b.i.d.  This may need to      increase.  Continue Skelaxin for spasm and Darvocet p.r.n.  I also      will schedule Darvocet before a.m. and afternoon therapies.  I will      add diclofenac 50 mg  b.i.d. per home regimen and watching closely      for GI blood pressure and anticoagulation side effects.  2. Rheumatoid arthritis:  Continue prednisone 40 mg p.o. daily.  3. Depression:  Continue Celexa.  I provided ego supportive therapy.      I think that she improves functionally, movable improve as well.  4. Blood pressure:  Continue Norvasc and labetalol.  We will place      p.r.n. on hold, Norvasc for hypotension.  Blood pressure was quite      low at admission today.  5. Normal-pressure hydrocephalus:  Monitor for now.  6. Anemia:  Add iron supplementation and check serial H and Hs while      on rehab.  7. Deep vein thrombosis prophylaxis with subcu heparin 5000 units q.8      h.  8. Constipation:  Fleets enema/Dulcolax suppository today.      Meredith Staggers, M.D.  Electronically Signed     ZTS/MEDQ  D:  12/14/2008  T:  12/15/2008  Job:  [id number]

## 2011-02-24 NOTE — Consult Note (Signed)
[name], [name]                ACCOUNT NO.:  [id number]   MEDICAL RECORD NO.:  [id number]          PATIENT TYPE:  INP   LOCATION:  3020                         FACILITY:  [facility]   PHYSICIAN:  [name], M.D.     DATE OF BIRTH:  [birth year]   DATE OF CONSULTATION:  12/03/2008  DATE OF DISCHARGE:                                 CONSULTATION   CHIEF COMPLAINT:  1. Cervical epidural abscess.  2. Neck pain.   INDICATIONS:  Ms. [name] is the mother of [name], an emergency  room physician here at [facility] who was admitted to the  hospital on December 01, 2008, after presenting with significant  weakness in the upper extremities and joint pain.  Ms. [name] has a  history of rheumatoid arthritis of 25-year duration and also has been  diagnosed with normal-pressure hydrocephalus.  She was seen by a  neurosurgeon at the [facility] in [place] and was  recommended to have surgery by [name].  She at that time  declined surgery for her normal-pressure hydrocephalus.  She states that  she declined that secondary to fact that she did not have any urinary  incontinence or any cognitive problems.  At the time of admission to the  hospital, she denied fevers, chills, or shortness of breath.  She had  told her son, [name], that she had had significant neck pain  approximately a week ago.  She states she simply awoke with pain.  It  hurt whenever she would move her neck.  This was associated with what  she felt was fairly profound weakness in the upper extremities.  The  weakness, however, was confounded by the fact that she had significant  flare or exacerbation of the rheumatoid arthritis and has had severe  pain in all of her joints.  She did not report urinary incontinence.  Her white count on admission was only 6.9 and a hematocrit of 32.8.  Secondary to feeling of the primary care physician, blood culture was  sent.  She was positive for  gram-positive cocci on cultures taken on  December 02, 2008.  CBC today shows she has a white count of 13.2.   Ms. [name] secondary to what she considered to be a fairly routine flare-  up of her rheumatoid arthritis, placed herself on a prednisone taper.  She does have prednisone available among her medications.  This did not  seem to significantly improve her situation and when she arrived in  [place] to visit her son, it was then that [name] brought his  mother to the emergency room.  I had also seen her at an [facility] prior to her arrival here and was treated with a Medrol Dosepak,  Percocet, and tramadol without relief.  Pain continued to worsen and she  found it increasingly difficult to walk or use the bathroom without  assistance.  She was noted to have some confusion on admission by Dr.  Nicole [name].   She subsequent to history of neck pain, underwent an MRI of  the cervical  spine.  That revealed what appeared to be a cervical epidural abscess.  This was centered behind the body of C5.  There was some cord distortion  signal and the spinal cord was normal.   PAST MEDICAL HISTORY:  Normal-pressure hydrocephalus, rheumatoid  arthritis.  She has had some sort of orthopedic procedure in the past.   SOCIAL HISTORY:  She does not smoke.  She does not drink.  She is  married and has 2 children.   ALLERGIES:  She had allergies to PENICILLINS and to SULFA-CONTAINING  MEDICATIONS.   MEDICATIONS ON ADMISSION:  Forteo subcu, Voltaren, Enbrel subcu,  Norvasc, Darvocet-N 100, Medrol which she has taken orally, vitamin C,  simvastatin, Norflex, Percocet, Skelaxin, tramadol, Tylenol also.   Her sedimentation rate on admission was 100.  Head CT showed  ventriculomegaly and firmly, Ms. [name] denied any fevers or chills  prior to her admission.   PHYSICAL EXAMINATION:  VITAL SIGNS:  Temperature 97.4, pulse 115,  respiratory rate 20, blood pressure 161/78, and 95%  saturation on air.  GENERAL:  Ms. [name] was lying in a bed, appears ill.  She has braces on  both wrists.  EXTREMITIES:  Any and all movement of the upper extremities causes her  to moan or express pain.  She has 4/5 strength in the upper extremities.  Pain does limit many of her movements.  Reflexes are 3+ at the biceps,  triceps, knees, and ankles.  Downgoing toes to plantar stimulation.  Intact proprioception in both the upper and lower extremities.  NEUROLOGIC:  Attention, span, and fund of knowledge normal.  Pulses are  good at the wrist and feet bilaterally.  Significant pain with flexion  and extension passively of her upper extremities.  Lower extremity  strength is actually about 4+/5 to 5-/5.   Cervical spine MRI was reviewed.  It shows non-enhancing mass on the  right side, which is clearly not disk material causing some pressure on  the spinal cord.  The bone appears intact.  Disk spaces are markedly  degenerated above the 4-5 and the 5-6 level.  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  I think frankly 4-5 does not look  significantly different than a 5-6 space.  Most of the mass is behind 5-  6.  It appears that she has underlying spondylitic changes.  She has a  reversal of the normal cervical lordosis in this area.  Again, now cord  signal is normal in the cervical spine.   I believe, [name] needs to have surgical decompression.  It does not  appear that we would be able to get in the operating room prior to 2:00  a.m. on December 04, 2008.  She will be transferred to the Neurosurgical  Intensive Care Unit.  She will have neural monitoring present.  As again  son being a physician, he has been able to tell me quite clearly that he  has not seen any decline in his mother's neurological status since her  admission to the hospital.  I will plan on taking her in the morning  time to the operating room for cervical decompression.  This of course  can change  at any moment if there is any neurologic decline as I am  prepared to take her to the operating room at any point.           ______________________________  [name], M.D.     KC/MEDQ  D:  12/03/2008  T:  12/04/2008  Job:  [id number]

## 2011-02-24 NOTE — Consult Note (Signed)
NAMECECEILIA, FAZZI                ACCOUNT NO.:  [id number]   MEDICAL RECORD NO.:  [id number]          PATIENT TYPE:  IPS   LOCATION:  [id number]                         FACILITY:  [facility]   PHYSICIAN:  Felizardo Hoffmann, M.D.  DATE OF BIRTH:  [birth year]   DATE OF CONSULTATION:  12/24/2008  DATE OF DISCHARGE:                                 CONSULTATION   [name] is sleeping 50% of her normal amount.  She does try the  trazodone.  She took 50 mg last night without achieving her normal  amount of sleep.  Her energy is still partially decreased; however, she  has improved.  Her mood is about 50% of normal.  She is not having  hopelessness now.  She is not having any thoughts of harming herself or  others.  She has no hallucinations or delusions.  Her orientation and  memory function are intact.   She is cooperative and appropriate with staff and working on her  physical therapy and ADL therapy.   REVIEW OF SYSTEMS:  GASTROINTESTINAL:  No nausea complaints with the  Celexa.   LABORATORY DATA:  Sodium 137, BUN 24, creatinine 0.75, glucose 124, WBC  10.4, hemoglobin 10.5, platelet count is 157.   EXAMINATION:  VITAL SIGNS:  Temperature 97.6.  Pulse 58.  Respiratory  rate 16.  Blood pressure 141/72.  O2 saturation on room air 96%.   MENTAL STATUS EXAM:  [name] is alert.  She is sitting in her  wheelchair with facies slightly downcast.  Affect is constricted.  Mood  is mildly depressed.  Her attention span is mildly decreased.  Concentration mildly decreased.  Her memory and orientation function are  intact.  Speech involves a slightly flat prosody without dysarthria.  Thought process is logical, coherent, and goal directed.  No looseness  of associations.  Thought content, no thoughts of harming herself or  others.  No delusions or hallucinations.  Insight is intact.  Judgment  is intact.   ASSESSMENT:  1. 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  2. 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   RECOMMENDATIONS:  1. Would increase the Celexa to 30 mg p.o. daily for anti-depression.  2. Would re-add the Remeron starting at 7.5 mg p.o. q.1800 for      augmentation of the Celexa and then would titrate the Remeron by      7.5 mg every 1 to 2 days as tolerated to the initial trial dose of      22.5 mg q.p.m.  3. Would continue to have the trazodone available at 25 to 50 mg      q.h.s. p.r.n. insomnia.  4. When starting an increase in the Remeron, would monitor for      sedation or constipation.      Felizardo Hoffmann, M.D.  Electronically Signed    JW/MEDQ  D:  12/24/2008  T:  12/24/2008  Job:  [id number]

## 2011-02-24 NOTE — Discharge Summary (Signed)
NAMESHAKEELAH, [name]                ACCOUNT NO.:  [id number]   MEDICAL RECORD NO.:  [id number]          PATIENT TYPE:  INP   LOCATION:  3007                         FACILITY:  [facility]   PHYSICIAN:  Adele Barthel, MD    DATE OF BIRTH:  1941-02-24   DATE OF ADMISSION:  12/01/2008  DATE OF DISCHARGE:                               DISCHARGE SUMMARY   ADDENDUM:  Dictating an addendum to the previous discharge summary  dictated by Dr. Kieth Brightly.  Please refer to his excellent  discharge summary dictated on December 10, 2008, for hospital course,  procedure performed, imaging, and discharge diagnosis.  The addendum  includes following since the last interim discharge summary, nothing new  has transpired.   DISCHARGE DIAGNOSES:  1. Cervical epidural abscess.  2. Rheumatoid arthritis.  3. Hypertension.  4. Depression.  5. Residual weakness.   DISCHARGE MEDICATIONS:  1. Amlodipine 10 mg p.o. daily.  2. Ceftriaxone IV 1 g every 24 hours.  Patient will need 3 more weeks      of IV ceftriaxone from today onward.  3. Celexa 20 mg p.o. daily.  4. Colace 100 mg p.o. every 12 hours.  5. Labetalol 300 mg p.o. every 12 hours.  6. Morphine sulfate 30 mg p.o. every 12 hours.  7. Protonix 40 mg p.o. every 12 hours.  8. Senna 1 tablet p.o. q.h.s.  9. Prednisone 30 mg p.o. daily for next 3 days.  After that,      prednisone 20 mg p.o. daily for 3 days.  After that, prednisone 10      mg p.o. daily for next 3 days.  After that, prednisone 5 mg p.o.      daily for 3 days then stop.  10.Mirtazapine 7.5 mg p.o. q.h.s.   DISPOSITION:  Patient is going to inpatient acute rehab for  strengthening and conditioning.   TOTAL TIME SPENT IN DISCHARGE OF THIS PATIENT:  Around 35 minutes.      Adele Barthel, MD  Electronically Signed     NP/MEDQ  D:  12/14/2008  T:  12/14/2008  Job:  [id number]

## 2011-02-25 ENCOUNTER — Ambulatory Visit: Payer: Medicare Other | Admitting: Infectious Diseases

## 2011-02-27 ENCOUNTER — Ambulatory Visit (INDEPENDENT_AMBULATORY_CARE_PROVIDER_SITE_OTHER): Payer: Medicare Other | Admitting: Infectious Diseases

## 2011-02-27 ENCOUNTER — Encounter: Payer: Self-pay | Admitting: Infectious Diseases

## 2011-02-27 VITALS — BP 125/75 | HR 67 | Temp 98.3°F | Ht 67.0 in | Wt 158.0 lb

## 2011-02-27 DIAGNOSIS — F329 Major depressive disorder, single episode, unspecified: Secondary | ICD-10-CM

## 2011-02-27 DIAGNOSIS — F32A Depression, unspecified: Secondary | ICD-10-CM

## 2011-02-27 DIAGNOSIS — M869 Osteomyelitis, unspecified: Secondary | ICD-10-CM

## 2011-02-27 DIAGNOSIS — F3289 Other specified depressive episodes: Secondary | ICD-10-CM

## 2011-02-27 MED ORDER — CEPHALEXIN 500 MG PO CAPS: ORAL_CAPSULE | ORAL | Status: DC

## 2011-02-27 NOTE — Progress Notes (Signed)
  Subjective:    Patient ID: [name], female    DOB: 06/26/1941, 70 y.o.   MRN: [id number]  HPI 70 yo F with hx of L TKR 2005 in [place]. Her course was complicated by femur fracture requiring a nail in 2007. She had re-do of these procedures December 2008. She also developed a cervical abscess with Group B streptococcus which required I & D. This unfortunately involved the knee prosthesis and she underwent I & D of her knee 05-2009, 11-2009 and 10-2010. 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 Her cultures again grew group B strep. She was started on rifampin and ancef and d/c to SNF on 01-26-11.  Today is day day 38 of antibiotics. She is rescheduled for repeat TKR on April 08, 2011. Is having persistent pain in her knee, requiring dilaudid. Has been having some swelling in her knee as well as erythema laterally. No probs with pic, has some mild pruritis.    Review of Systems  Constitutional: Negative for fever and chills.  Gastrointestinal: Negative for diarrhea and constipation.  Genitourinary: Negative for dysuria.  has some occas pruritis, using otc lotion and benadryl      Objective:   Physical Exam  Constitutional: She appears well-developed and well-nourished.  Musculoskeletal:       Arms:      Legs:         Assessment & Plan:

## 2011-02-27 NOTE — Assessment & Plan Note (Signed)
She appears to be doing well. They anxious for re-implantation. Will plan for her IV antibiotics to stop on 5-22 with removal of PIC then as well. She will be transitioned to po keflex 1g bid at that point and contiuned on her rifampin. Will check her esr, crp and cbc, cmp at that point as well. We spoke that her knee re-implantation can be guided by these markers. However they are not overly sensitive/specific markers of inflammation as they can be raised by other conditions (in particular her RA). Will see her back in 1 month.

## 2011-03-06 ENCOUNTER — Telehealth: Payer: Self-pay | Admitting: *Deleted

## 2011-03-06 NOTE — Telephone Encounter (Signed)
Patients husband called concerned that her sed rate has increased, would like to know Dr. Algis Downs thoughts on this. Myrtis Hopping CMA

## 2011-03-10 NOTE — Telephone Encounter (Signed)
Spoke with Dr. [name] and he will watch pts. Sed rate, but he feels it is only  Moderately high and this should not keep her from having her knee surgery.  Called husband back and relayed this information to him. [name] CMA

## 2011-03-25 ENCOUNTER — Ambulatory Visit (INDEPENDENT_AMBULATORY_CARE_PROVIDER_SITE_OTHER): Payer: Medicare Other | Admitting: Infectious Diseases

## 2011-03-25 ENCOUNTER — Encounter: Payer: Self-pay | Admitting: Infectious Diseases

## 2011-03-25 VITALS — BP 118/67 | HR 68 | Temp 98.1°F | Ht 67.0 in | Wt 155.0 lb

## 2011-03-25 DIAGNOSIS — M869 Osteomyelitis, unspecified: Secondary | ICD-10-CM

## 2011-03-25 NOTE — Progress Notes (Signed)
  Subjective:    Patient ID: [name], female    DOB: 04/06/1941, 70 y.o.   MRN: [id number]  HPI 70 yo F with hx of L TKR 2005 in [place]. Her course was complicated by femur fracture requiring a nail in 2007. She had re-do of these procedures December 2008. She also developed a cervical abscess with Group B streptococcus which required I & D. This unfortunately involved the knee prosthesis and she underwent I & D of her knee 05-2009, 11-2009 and 10-2010. 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 Her cultures again grew group B strep. She was started on rifampin and ancef and d/c to SNF on 01-26-11.  She completed her IV anbx on 5-22 and was changed to po keflex at that time.  Feels like her knee is doing ok. Taking "alot of pain pills". Swelling is as "good as it can be". No heat. No erythema. No f/c. Is taking rifampin and keflex. No problems with yeast infections. Has been itching frequently but resolves of its own accord.  Knee replacement is scheduled for April 08, 2011.  ESR 24 (02-20-11) to 47 (5-22).   Review of Systems     Objective:   Physical Exam  Constitutional: She appears well-developed and well-nourished.  Musculoskeletal:       Legs:         Assessment & Plan:

## 2011-03-25 NOTE — Assessment & Plan Note (Signed)
She is doing well. I am not sure if here continued heat is due to continued infection. They are concerned about her rising sed rate. iIll recheck that today. Will call her after the  Results are back. She may need to go back on IV prior to her surgery?

## 2011-04-02 ENCOUNTER — Other Ambulatory Visit: Payer: Self-pay | Admitting: Orthopedic Surgery

## 2011-04-02 ENCOUNTER — Encounter (HOSPITAL_COMMUNITY): Payer: Medicare Other

## 2011-04-02 LAB — SURGICAL PCR SCREEN
MRSA, PCR: NEGATIVE
Staphylococcus aureus: NEGATIVE

## 2011-04-02 LAB — CBC
HCT: 40.3 % (ref 36.0–46.0)
Hemoglobin: 12.8 g/dL (ref 12.0–15.0)
MCH: 27.5 pg (ref 26.0–34.0)
MCV: 86.7 fL (ref 78.0–100.0)
RBC: 4.65 MIL/uL (ref 3.87–5.11)
WBC: 7.1 10*3/uL (ref 4.0–10.5)

## 2011-04-02 LAB — COMPREHENSIVE METABOLIC PANEL
Albumin: 3.8 g/dL (ref 3.5–5.2)
BUN: 16 mg/dL (ref 6–23)
Calcium: 10 mg/dL (ref 8.4–10.5)
Chloride: 99 mEq/L (ref 96–112)
Creatinine, Ser: 0.83 mg/dL (ref 0.50–1.10)
Total Bilirubin: 0.3 mg/dL (ref 0.3–1.2)

## 2011-04-02 LAB — URINALYSIS, ROUTINE W REFLEX MICROSCOPIC
Bilirubin Urine: NEGATIVE
Glucose, UA: NEGATIVE mg/dL
Hgb urine dipstick: NEGATIVE
Nitrite: NEGATIVE
Specific Gravity, Urine: 1.015 (ref 1.005–1.030)
pH: 6.5 (ref 5.0–8.0)

## 2011-04-02 LAB — URINE MICROSCOPIC-ADD ON

## 2011-04-02 LAB — PROTIME-INR
INR: 1.03 (ref 0.00–1.49)
Prothrombin Time: 13.7 seconds (ref 11.6–15.2)

## 2011-04-08 ENCOUNTER — Ambulatory Visit: Payer: Medicare Other | Admitting: Infectious Diseases

## 2011-04-08 ENCOUNTER — Inpatient Hospital Stay (HOSPITAL_COMMUNITY)
Admission: RE | Admit: 2011-04-08 | Discharge: 2011-04-13 | DRG: 467 | Disposition: A | Discharge status: Skilled Nursing Facility | Payer: Medicare Other | Source: Ambulatory Visit | Attending: Orthopedic Surgery | Admitting: Orthopedic Surgery

## 2011-04-08 DIAGNOSIS — D62 Acute posthemorrhagic anemia: Secondary | ICD-10-CM | POA: Diagnosis not present

## 2011-04-08 DIAGNOSIS — E871 Hypo-osmolality and hyponatremia: Secondary | ICD-10-CM | POA: Diagnosis not present

## 2011-04-08 DIAGNOSIS — I10 Essential (primary) hypertension: Secondary | ICD-10-CM | POA: Diagnosis present

## 2011-04-08 DIAGNOSIS — F3289 Other specified depressive episodes: Secondary | ICD-10-CM | POA: Diagnosis present

## 2011-04-08 DIAGNOSIS — M810 Age-related osteoporosis without current pathological fracture: Secondary | ICD-10-CM | POA: Diagnosis present

## 2011-04-08 DIAGNOSIS — F0789 Other personality and behavioral disorders due to known physiological condition: Secondary | ICD-10-CM | POA: Diagnosis present

## 2011-04-08 DIAGNOSIS — F329 Major depressive disorder, single episode, unspecified: Secondary | ICD-10-CM | POA: Diagnosis present

## 2011-04-08 DIAGNOSIS — Z01812 Encounter for preprocedural laboratory examination: Secondary | ICD-10-CM

## 2011-04-08 DIAGNOSIS — M069 Rheumatoid arthritis, unspecified: Secondary | ICD-10-CM | POA: Diagnosis present

## 2011-04-08 DIAGNOSIS — M21869 Other specified acquired deformities of unspecified lower leg: Principal | ICD-10-CM | POA: Diagnosis present

## 2011-04-08 DIAGNOSIS — R32 Unspecified urinary incontinence: Secondary | ICD-10-CM | POA: Diagnosis present

## 2011-04-08 DIAGNOSIS — E7800 Pure hypercholesterolemia, unspecified: Secondary | ICD-10-CM | POA: Diagnosis present

## 2011-04-08 LAB — GRAM STAIN

## 2011-04-09 LAB — BASIC METABOLIC PANEL
CO2: 28 mEq/L (ref 19–32)
Glucose, Bld: 143 mg/dL — ABNORMAL HIGH (ref 70–99)
Potassium: 4 mEq/L (ref 3.5–5.1)
Sodium: 133 mEq/L — ABNORMAL LOW (ref 135–145)

## 2011-04-09 LAB — CBC
Hemoglobin: 9.6 g/dL — ABNORMAL LOW (ref 12.0–15.0)
RBC: 3.43 MIL/uL — ABNORMAL LOW (ref 3.87–5.11)
WBC: 9.1 10*3/uL (ref 4.0–10.5)

## 2011-04-10 LAB — CBC
Hemoglobin: 8.8 g/dL — ABNORMAL LOW (ref 12.0–15.0)
MCH: 27.8 pg (ref 26.0–34.0)
MCV: 85.4 fL (ref 78.0–100.0)
RBC: 3.16 MIL/uL — ABNORMAL LOW (ref 3.87–5.11)

## 2011-04-10 LAB — BASIC METABOLIC PANEL
CO2: 25 mEq/L (ref 19–32)
Calcium: 9 mg/dL (ref 8.4–10.5)
Chloride: 103 mEq/L (ref 96–112)
Creatinine, Ser: 0.62 mg/dL (ref 0.50–1.10)
Glucose, Bld: 132 mg/dL — ABNORMAL HIGH (ref 70–99)
Sodium: 135 mEq/L (ref 135–145)

## 2011-04-11 LAB — CBC
HCT: 23 % — ABNORMAL LOW (ref 36.0–46.0)
MCH: 27.9 pg (ref 26.0–34.0)
MCV: 86.8 fL (ref 78.0–100.0)
Platelets: 115 10*3/uL — ABNORMAL LOW (ref 150–400)
RBC: 2.65 MIL/uL — ABNORMAL LOW (ref 3.87–5.11)

## 2011-04-11 LAB — WOUND CULTURE

## 2011-04-12 LAB — CBC
HCT: 27.4 % — ABNORMAL LOW (ref 36.0–46.0)
Hemoglobin: 9.2 g/dL — ABNORMAL LOW (ref 12.0–15.0)
MCH: 28.2 pg (ref 26.0–34.0)
MCHC: 33.6 g/dL (ref 30.0–36.0)
MCV: 84 fL (ref 78.0–100.0)

## 2011-04-12 LAB — TYPE AND SCREEN

## 2011-04-13 LAB — ANAEROBIC CULTURE

## 2011-04-22 NOTE — H&P (Addendum)
  NAMEGESSEL, [name] NO.:  [id number]  MEDICAL RECORD NO.:  [id number]  LOCATION:                                 FACILITY:  PHYSICIAN:  [name], M.D.    DATE OF BIRTH:  Mar 01, 1941  DATE OF ADMISSION: DATE OF DISCHARGE:                             HISTORY & PHYSICAL   DATE OF ADMISSION:  April 08, 2011  CHIEF COMPLAINT:  Left knee pain.  HISTORY OF PRESENT ILLNESS:  Patient is a 70 year old female who has been seen by Dr. Wynelle Link.  She had a previous left knee resection.  She has undergone appropriate antibiotics and doing well now.  She presents for reimplantation.  ALLERGIES: 1. PENICILLIN. 2. SULFA. 3. METHOTREXATE.  CURRENT MEDICATIONS:  Colace, amlodipine, rifampin, Robaxin, Restoril, Protonix, Dilaudid, Benadryl, Lexapro, Keflex.  PAST MEDICAL HISTORY: 1. History of depression. 2. Hypertension. 3. Hypercholesterolemia. 4. Slight urinary incontinence. 5. Osteoporosis. 6. Rheumatoid arthritis. 7. Childhood illnesses of measles, mumps. 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  PAST SURGICAL HISTORY: 1. Laminectomy. 2. Oophorectomies. 3. Left knee replacement. 4. Femur repair. 5. Redo of the femur in 2008. 6. Cervical laminectomy. 7. Left knee I and D and then resection of knee.  FAMILY HISTORY:  Father deceased at age 76.  Mother deceased at 62 with ovarian cancer.  SOCIAL HISTORY:  Married.  Retired.  Past smoker.  No alcohol.  She does have a caregiver lined up.  Does have a living will and healthcare power of attorney.  REVIEW OF SYSTEMS:  GENERAL:  No fevers, chills, or night sweats.  Does have a little bit short-term memory loss.  NEUROLOGIC:  Occasional headaches.  No seizure or syncope.  She does have normal pressure hydrocephalus with the shunt.  RESPIRATORY:  No shortness of breath, productive cough, or hemoptysis.  CARDIOVASCULAR:  No chest pain or orthopnea.  GI:  No  nausea, vomiting, diarrhea, or constipation.  GU: No dysuria, hematuria, or discharge.  MUSCULOSKELETAL:  Knee pain.  PHYSICAL EXAMINATION:  VITAL SIGNS:  Pulse 72, respirations 16, blood pressure 138/70. GENERAL:  70 year old white female, well nourished, well developed, no acute distress, good historian, accompanied by her husband. HEENT:  Normocephalic, atraumatic.  Pupils are round and reactive.  EOMs intact. NECK:  Supple. CHEST:  Clear. HEART:  Regular rate and rhythm without murmur.  S1 and S2 noted. ABDOMEN:  Soft, nontender.  Bowel sounds present. RECTAL/BREASTS/GENITALIA:  Not done, not part of present illness. EXTREMITIES:  Left knee, motor intact.  Previous incision well healed. Knee immobilizer in place.  Sensation intact  IMPRESSION:  Infection of left knee.  PLAN:  The patient admitted to [facility] to undergo reimplantation of the total knee arthroplasty.  Surgery will be performed by Dr. [name].     Suzanne Garbers L. Dara Lords, P.A.C.   ______________________________ [name], M.D.    ALP/MEDQ  D:  04/14/2011  T:  04/14/2011  Job:  [id number]  cc:   Rita Ohara, MD Fax: [phone]  Electronically Signed by [name] M.D. on 04/22/2011 12:32:55 PM Electronically Signed by Augusto Garbe Cindi Ghazarian P.A.C. on 04/23/2011 10:39:40 AM

## 2011-04-22 NOTE — Op Note (Signed)
NAMESHAMICA, [name]                ACCOUNT NO.:  [id number]  MEDICAL RECORD NO.:  [id number]  LOCATION:  [id number]                         FACILITY:  [facility]  PHYSICIAN:  [name], M.D.    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  PROCEDURE:  Left total knee arthroplasty, reimplantation.  SURGEON:  [name], M.D.  ASSISTANT:  Alexzandrew L. Perkins, P.A.C.  ANESTHESIA:  General.  ESTIMATED BLOOD LOSS:  Minimal.  DRAINS:  Hemovac x1.  TOURNIQUET TIME:  Up 51 minutes at 300 mmHg, down 10 minutes and then up additional 20 minutes at 300 mmHg.  COMPLICATIONS:  None.  CONDITION.:  Stable to recovery.  BRIEF CLINICAL NOTE:  [name] is a 70 year old female who had knee replacement surgery done in [place] several years ago.  She went on to develop a gram-positive infection.  I saw her in consultation and performed a resection arthroplasty.  She had an antibiotic spacer.  Sed rate and C-reactive protein have trended down towards normal.  She is showing no signs of persistent or recurrent infection.  She presents now for total knee arthroplasty with reimplantation.  PROCEDURE IN DETAIL:  After successful administration of general anesthetic, a tourniquet was placed high on her left thigh and left lower extremity prepped and draped in the usual sterile fashion. Extremity was wrapped in Esmarch and tourniquet inflated to 300 mmHg. Previous midline incision were utilized, skin cut with 10 blade through subcutaneous tissue.  A fresh blade was used make a medial parapatellar arthrotomy.  There was minimal if any fluid in the joint.  We did send fluid present for a Gram stain which ended up being negative.  Soft tissue over  the proximal medial tibia was subperiosteally elevated to the joint line with the knife and into the semimembranosus bursa with a Cobb elevator.  Soft tissue laterally was elevated with attention being paid to avoid patellar tendon on tibial tubercle.  I was able to flex the knee and remove the spacer.  The bone quality was actually very good.  We thoroughly debrided any scar tissue from around the bone and around the joint edges.  I then I used a starter reamer to gain access to the femoral and tibial canals.  On the femoral side, we reamed up to 16 mm which had great fit, on the tibial side up to 13 mm for placement of 13 mm cemented stem.  All fibrinous debris was removed from the femoral and tibial canals.  They were irrigated further.  Tibia was then subluxed forward and retractors placed.  The extramedullary tibial alignment guide was placed referencing proximally at the medial aspect of the tibial tubercle and distally along the second metatarsal axis and tibial crest.  The block was pinned to cut down to a flush resection level and to have normal bone.  We then resected for about 5 or 6 mm to get down to this level.  The size 2 at this level  was the most appropriate tibial tray, thus I was going to use the built-up tray which goes from a size 2 at the bone level to 4 at the joint level.  This would correspond better with a size of her femur also.  The proximal tibia was then prepared with a modular drill and keel punch.  I prepared for a 29-mm sleeve which had excellent torsional and rotational stability.  The trial tibia was then set up which is the size 2 to 4 buildup which is a 15-mm thickness of the tray which is 2 at the bone surface, 4 at the joint surface, with a  29 sleeve and 13 x 30 stem extension.  We then addressed the femur.  A 60-mm reamer was placed back in the canal, inserted intramedullary cutting guide.  We placed the distal femoral cutting block at 5 degrees  of valgus.  To get the appropriate joint line, we had a 4 mm augment distally on the medial side, 8 mm augment distally on the lateral side.  This allowed to resected to healthy bone and also to get the joint line out to the appropriate level.  With the size 3, it was the best size for the femoral component. The rotation was marked at the epicondylar axis and confirmed by creating rectangular flexion gap at 90 degrees.  AP block was placed in the +2 position to effectively raise the stem and lower the anterior phalange of the femur down to the anterior cortex.  The posterior cuts would require a 4-mm augment posterolateral in order to gain bony contact.  Medially we did not need a posterior augment.  The intercondylar block was then placed and the intercondylar chamfer cuts made.  I did the intercondylar cut for TC3 component.  Trial femur was then side up which was a TC3 size 3 with a 16 x 115 stem extension in a +2 position, a 4 mm medial distal augment, 8 mm lateral distal augment, 4 mm posterior lateral augment.  This had an excellent fit on the femoral surface.  At this time, both the tibial and femoral component trials were in place.  I placed a 12.5 mm posterior stabilized insert. I went up to 15 which allowed for full extension with excellent varus- valgus and anterior-posterior balance throughout full range of motion. I inspected the patella and the patella was thinned down already to just that 10 mm.  I did not feel it would be safe to resurface this down to a thinner level and then placed a plastic line.  We did a patella plasty and the patella tracked normally.  While the components were being opened and assembled on the back table, I deflated the tourniquet down for total 10 minutes.  Minor bleeding was identified and stopped with cautery.  The components were then assembled on the back table.  Once they were completely assembled which took 10 minutes, then leg was rewrapped  in Esmarch and tourniquet reinflated to 300 mmHg.  The trial components were removed and I sized the cement restrictor on tibial side which was a 4.  The size 4 restrictor was placed in the appropriate depth in the tibial canal.  The cut bone surfaces were then prepared with pulsatile lavage.  The cement was mixed with 3 batches of gentamicin impregnated cement.  It was then injected into the tibial canal and pressurized.  The tibial components were impacted and extruded cement removed.  On the femoral side, we cemented distally and  press-fit at the stem.  Femoral components were impacted, extruded cement removed.  Trial 15-mm insert was placed, knee held in full extension, all extruded cement removed.  When the cement fully hardened, then the permanent 15 mm TC3 rotating platform insert was placed into the tibial tray.  The wound was then copiously irrigated with saline solution and the arthrotomy closed over Hemovac drain with interrupted #1 PDS.  Flexion against gravity was about 85 degrees.  I was pleased with this, so she had been stiff in full extension for 8 weeks and we got 85 degrees of flexion at this point and it was felt to be acceptable.  Her tissues were pliable enough that she should stretch and eventually get 110 to 115 degrees of flexion when she slowly rehabs. The tourniquet was then released, second tourniquet time of 20 minutes. Subcu was then closed interrupted with 2-0 Vicryl and skin with staples. Catheter for Marcaine pain pump was placed and pump initiated.  Incisions were cleaned and dried and a bulky sterile dressing applied.  Hemovac hooked to suction.  She was placed into a knee immobilizer, awakened, and transported to recovery in stable condition.     [name], M.D.     FA/MEDQ  D:  04/08/2011  T:  04/08/2011  Job:  [id number]  Electronically Signed by [name] M.D. on 04/22/2011 12:32:52 PM

## 2011-04-22 NOTE — Discharge Summary (Signed)
NAMEDEBROAH, ASHMAN                ACCOUNT NO.:  [id number]  MEDICAL RECORD NO.:  [id number]  LOCATION:  [id number]                         FACILITY:  [facility]  PHYSICIAN:  [name], M.D.    DATE OF BIRTH:  25-Oct-1940  DATE OF ADMISSION:  04/08/2011 DATE OF DISCHARGE:                        DISCHARGE SUMMARY - REFERRING   TENTATIVE DATE OF DISCHARGE:  Tomorrow, April 11, 2011.  ADMITTING DIAGNOSES: 1. 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 2. Impaired cognitive function/memory. 3. History of depression. 4. Hypertension. 5. Hypercholesterolemia. 6. Slight urinary incontinence. 7. Osteoporosis. 8. Rheumatoid arthritis. 9. Childhood illnesses measles, mumps.  DISCHARGE DIAGNOSES: 1. Previous resection arthroplasty of left knee secondary to     infection, status post reimplantation of left total knee     arthroplasty. 2. Postop acute blood loss anemia. 3. Postop hyponatremia. 4. Impaired cognitive function/memory. 5. History of depression. 6. Hypertension. 7. Hypercholesterolemia. 8. Slight urinary incontinence. 9. Osteoporosis. 10.Rheumatoid arthritis. 11.Childhood illnesses measles, mumps.  PROCEDURE:  April 08, 2011, reimplantation of left total knee.  Surgeon was Dr. Wynelle Link.  Assistant, [place] Perkins, P.A.C.  Anesthesia general.  Tourniquet time, 51 minutes and down for 10 minutes and then up an additional 20 minutes for total 71 minutes.  CONSULTS:  None.  BRIEF HISTORY:  Ms. Mundle is a 70 year old female who had her knee replacement done in [place] several years ago, developed gram-positive infection.  Saw in consultation, performed resection arthroplasty with antibiotic spacer.  Her labs including sed rate, C-reactive protein trended downward and now she presents for reimplantation.  LABORATORY DATA:  Preop CBC showed hemoglobin 12.8, hematocrit of 40.3 white cell count 7.1, platelets 177.  PT/INR and 13.7 and 1.03 with a PTT of  29.  Chem panel on admission all within normal limits.  Preop UA, small leukocytes, 3 to 6 red cells, 3 to 6 white cells,  rare bacteria. Blood group type A negative.  Nasal swabs were negative for staph aureus, negative for MRSA.  Stat Gram stain taken at time of surgery was no organisms.  Wound culture, no organism seen on the smear, wound culture pending at time of dictation.  Anaerobe culture, no organism seen on the smear.  Anaerobe final cultures pending at time of dictation.  Serial CBCs were followed.  Hemoglobin dropped down from 12.8 to 9.6 and 8.8.  Please note that there is a last CBC pending on the date of discharge.  Serial BMETs were followed for 48 hours.  Sodium did drop from 137 to 133, back up to 135.  Chest x-ray, she had a chest x-ray done on January 22, 2011, PICC line at cavoatrial junction.  No pneumothorax.  At that time she also had chronic interstitial fibrotic changes as noted, otherwise both lungs were clear.  EKG dated October 16, 2010, normal sinus rhythm, normal EKG. No prior EKG.  Cannot read the signature on the faxed EKG.  HOSPITAL COURSE:  The patient admitted to [facility], taken to OR, underwent above-stated procedure without complication.  The patient tolerated the procedure well, later transferred to recovery room and orthopedic floor, started on p.o. and IV analgesic pain control following surgery, given 24 hours  postop IV antibiotics.  She actually had a fair amount of moderate pain on the evening of the surgery and doing a little bit better on the morning of day 1, she was seen in rounds, started getting up with therapy.  She was started back on her home meds.  Her pressure was fine, had good urine output.  She had a little bit of a dilutional component with her sodium felt to be due to from the fluids.  We rechecked that by day 2, her sodium was already back up.  Once she started up with therapy, she actually did pretty well on day 1,  walking about 20 feet.  She was seen on the morning of day 2. Pain was under good control.  She looked very comfortable, tolerating p.o. meds.  We DC'ed the PCA and also DC'ed her Foley on day 2.  We started getting her up with more therapy.  The plan was for her to go to skilled nursing facility over [place].  We got the social work involved and they were contacted in [place].  It was felt if the bed was available on Saturday, she was doing well enough on Friday that we would allow her to go over to skilled facility tomorrow.  She was seen in rounds on day 2 by Dr. Wynelle Link.  Tentative plan was to let her go tomorrow if the bed was available and if she continued continued to do well.  DISCHARGE PLAN: 1. Tentative transfer set up for tomorrow on April 11, 2011, if bed     available at [facility]. 2. Discharge diagnosis, please see above. 3. Discharge meds.  Current medications at time of transfer include: 1. Xarelto 10 mg p.o. daily for 3 weeks, then discontinue the Xarelto. 2. Colace 100 mg p.o. b.i.d. 3. Lexapro 20 mg daily. 4. Cymbalta 30 mg twice a day. 5. Protonix 40 mg daily. 6. Vesicare 5 mg p.o. q.h.s.7. Temazepam 15 mg p.o. q.h.s. p.r.n. sleep. 8. Labetalol 100 mg 1/2 tablet twice a day. 9. Amlodipine 5 mg p.o. q.a.m. 10.Tylenol 325 one or two every 4 to 6 hours as needed for mild pain,     temperature or headache. 11.Laxative of choice. 12.Enema of choice. 13.Robaxin 500 mg p.o. q.6-8 h. p.r.n. spasm 14.Dilaudid 4 mg tablets 1 or 2 every 4 to 6 hours as needed for     moderate pain.  DIET:  Heart-healthy diet.  ACTIVITY:  She is weightbearing as tolerated.  Total knee protocol.  PT and OT for gait training, ambulation, ADLs range of motion and strengthening exercises.  Please note she may start showering once she is transferred over to [facility].  Please do not submerge the incision under water.  Daily dressing change.  FOLLOWUP:  She is to follow up on April 21, 2011, on Tuesday for appointment.  Please contact the office at [phone] to help set up an appointment, arrange a time for her to come over April 21, 2011, on Tuesday for wound check and staple removal.  DISPOSITION:  Plan is to go to [place] once a bed is available.  Tentative transfer tomorrow on Saturday if able to accept transfer.  CONDITION ON DISCHARGE:  At the time of dictation the patient was improving.     Alexzandrew L. Dara Lords, P.A.C.   ______________________________ [name], M.D.    ALP/MEDQ  D:  04/10/2011  T:  04/10/2011  Job:  [id number]  cc:   Rita Ohara, MD Fax: [phone]  Electronically Signed by  ALEXZANDREW PERKINS P.A.C. on 04/13/2011 08:09:49 AM Electronically Signed by [name] M.D. on 04/22/2011 12:32:43 PM

## 2011-04-22 NOTE — Discharge Summary (Signed)
  [name], [name]                ACCOUNT NO.:  [id number]  MEDICAL RECORD NO.:  [id number]  LOCATION:  [id number]                         FACILITY:  [facility]  PHYSICIAN:  [name], M.D.    DATE OF BIRTH:  August 15, 1941  DATE OF ADMISSION:  04/08/2011 DATE OF DISCHARGE:  04/13/2011                        DISCHARGE SUMMARY - REFERRING   ADDENDUM:  ADMITTING AND DISCHARGE DIAGNOSIS:  Please see original discharge summary.  NEW DISCHARGE DIAGNOSIS:  Status post transfusion without sequelae.  PROCEDURE:  Re-implantation left total knee, performed on April 08, 2011.  ADDITIONAL LABS:  She had low hemoglobin on April 11, 2011, hemoglobin was 7.4 with a hematocrit of 23.0.  She was given 2 units of blood. Post receiving, hemoglobin back up to 9.2.  Last noted H and H was 9.2 and 27.4.  HOSPITAL COURSE:  There was a possibility that the patient's rehab bed was going to be available on June 30 on Saturday.  However, it was not available.  She remained in the hospital on Saturday and Sunday, continuing to receive therapy.  She was assisted with meds and her bowels started moving.  Incision was healing well.  No signs of infection.  She was getting up, walking in the hallway with PT, walking over 25 to 50 feet.  She was seen back in rounds on Monday morning on April 13, 2011 by Dr. Wynelle Link.  She was doing well.  Pain was improving and at this time, she was ready to go over to [place].  DISCHARGE PLAN: 1. The patient will be transferred to [place]. 2. Discharge diagnosis, please see above.  DISCHARGE MEDS:  All medications were dictated on the original discharge summary.  Please see original discharge summary.  DIET:  Heart-healthy diet.  ACTIVITY:  Please see previous discharge summary.  FOLLOWUP:  Followup on Tuesday, July 10.  Please contact the office at [phone] to help arrange appointment and time for followup care.  DISPOSITION:   [place].  CONDITION ON DISCHARGE:  Improving.     Alexzandrew L. Dara Lords, P.A.C.   ______________________________ [name], M.D.    ALP/MEDQ  D:  04/13/2011  T:  04/13/2011  Job:  [id number]  cc:   Rita Ohara, MD Fax: [phone]  Electronically Signed by Mickel Crow P.A.C. on 04/14/2011 07:31:06 AM Electronically Signed by [name] M.D. on 04/22/2011 12:32:45 PM

## 2011-04-24 ENCOUNTER — Telehealth: Payer: Self-pay | Admitting: *Deleted

## 2011-04-24 NOTE — Telephone Encounter (Signed)
Patient's husband called she had knee replacement surgery 04/08/11.  She had an appt scheduled with Dr. [name] for 04/27/11 and he did not feel she needed the appt, is no longer on antibiotics.  He wanted appointment cancelled. Myrtis Hopping CMA

## 2011-04-27 ENCOUNTER — Ambulatory Visit: Payer: Medicare Other | Admitting: Infectious Diseases

## 2011-12-22 ENCOUNTER — Other Ambulatory Visit: Payer: Self-pay | Admitting: Neurology

## 2011-12-22 DIAGNOSIS — R519 Headache, unspecified: Secondary | ICD-10-CM

## 2011-12-31 ENCOUNTER — Other Ambulatory Visit: Payer: Medicare Other

## 2012-01-05 ENCOUNTER — Other Ambulatory Visit: Payer: Self-pay | Admitting: Neurology

## 2012-01-05 ENCOUNTER — Ambulatory Visit
Admission: RE | Admit: 2012-01-05 | Discharge: 2012-01-05 | Disposition: A | Discharge status: Home / Self Care | Payer: Medicare Other | Source: Ambulatory Visit | Attending: Neurology | Admitting: Neurology

## 2012-01-05 DIAGNOSIS — R519 Headache, unspecified: Secondary | ICD-10-CM

## 2012-01-05 MED ORDER — DEXAMETHASONE SODIUM PHOSPHATE 4 MG/ML IJ SOLN
4.0000 mg | Freq: Once | INTRAMUSCULAR | Status: AC
  Administered 2012-01-05: 4 mg

## 2012-01-05 NOTE — Discharge Instructions (Signed)

## 2012-03-23 ENCOUNTER — Other Ambulatory Visit: Payer: Self-pay | Admitting: Neurology

## 2012-03-23 DIAGNOSIS — R51 Headache: Secondary | ICD-10-CM

## 2012-03-23 DIAGNOSIS — M47812 Spondylosis without myelopathy or radiculopathy, cervical region: Secondary | ICD-10-CM

## 2012-04-05 ENCOUNTER — Ambulatory Visit
Admission: RE | Admit: 2012-04-05 | Discharge: 2012-04-05 | Disposition: A | Discharge status: Home / Self Care | Payer: Medicare Other | Source: Ambulatory Visit | Attending: Neurology | Admitting: Neurology

## 2012-04-05 DIAGNOSIS — M47812 Spondylosis without myelopathy or radiculopathy, cervical region: Secondary | ICD-10-CM

## 2012-04-05 DIAGNOSIS — R51 Headache: Secondary | ICD-10-CM

## 2012-04-05 MED ORDER — TRIAMCINOLONE ACETONIDE 40 MG/ML IJ SUSP (RADIOLOGY)
60.0000 mg | Freq: Once | INTRAMUSCULAR | Status: AC
  Administered 2012-04-05: 60 mg

## 2012-04-05 NOTE — Discharge Instructions (Signed)

## 2012-10-17 DIAGNOSIS — M47812 Spondylosis without myelopathy or radiculopathy, cervical region: Secondary | ICD-10-CM | POA: Insufficient documentation

## 2012-10-17 DIAGNOSIS — R519 Headache, unspecified: Secondary | ICD-10-CM | POA: Insufficient documentation

## 2012-11-14 ENCOUNTER — Other Ambulatory Visit: Payer: Self-pay | Admitting: Neurology

## 2012-11-14 DIAGNOSIS — M5481 Occipital neuralgia: Secondary | ICD-10-CM

## 2012-11-23 ENCOUNTER — Ambulatory Visit
Admission: RE | Admit: 2012-11-23 | Discharge: 2012-11-23 | Disposition: A | Discharge status: Home / Self Care | Payer: Medicare Other | Source: Ambulatory Visit | Attending: Neurology | Admitting: Neurology

## 2012-11-23 DIAGNOSIS — M5481 Occipital neuralgia: Secondary | ICD-10-CM

## 2013-01-18 ENCOUNTER — Telehealth: Payer: Self-pay

## 2013-01-18 MED ORDER — TRAZODONE HCL 100 MG PO TABS: 100.0000 mg | ORAL_TABLET | Freq: Every day | ORAL | Status: DC

## 2013-01-18 NOTE — Telephone Encounter (Signed)
Patient requesting refill on Trazodone.  Info transferred from old EMR Loss adjuster, chartered)

## 2013-03-22 ENCOUNTER — Telehealth: Payer: Self-pay | Admitting: Neurology

## 2013-03-22 DIAGNOSIS — IMO0002 Reserved for concepts with insufficient information to code with codable children: Secondary | ICD-10-CM

## 2013-03-22 NOTE — Telephone Encounter (Signed)
I called patient and I left a message. I'll set up a right occipital nerve block.

## 2013-03-23 NOTE — Telephone Encounter (Addendum)
Patient requesting appt for nerve block. Called and explain referral process. Pt agreed.

## 2013-03-27 ENCOUNTER — Other Ambulatory Visit: Payer: Self-pay | Admitting: Neurology

## 2013-03-27 DIAGNOSIS — M5410 Radiculopathy, site unspecified: Secondary | ICD-10-CM

## 2013-03-27 DIAGNOSIS — M792 Neuralgia and neuritis, unspecified: Secondary | ICD-10-CM

## 2013-03-30 ENCOUNTER — Ambulatory Visit
Admission: RE | Admit: 2013-03-30 | Discharge: 2013-03-30 | Disposition: A | Discharge status: Home / Self Care | Payer: Medicare Other | Source: Ambulatory Visit | Attending: Neurology | Admitting: Neurology

## 2013-03-30 DIAGNOSIS — M792 Neuralgia and neuritis, unspecified: Secondary | ICD-10-CM

## 2013-03-30 DIAGNOSIS — M5410 Radiculopathy, site unspecified: Secondary | ICD-10-CM

## 2013-03-30 MED ORDER — DEXAMETHASONE SODIUM PHOSPHATE 4 MG/ML IJ SOLN
4.0000 mg | Freq: Once | INTRAMUSCULAR | Status: AC
  Administered 2013-03-30: 4 mg

## 2013-03-30 MED ORDER — IOHEXOL 300 MG/ML  SOLN
1.0000 mL | Freq: Once | INTRAMUSCULAR | Status: AC | PRN
Start: 2013-03-30 — End: 2013-03-30
  Administered 2013-03-30: 1 mL via EPIDURAL

## 2013-04-17 ENCOUNTER — Encounter: Payer: Self-pay | Admitting: Neurology

## 2013-04-17 ENCOUNTER — Ambulatory Visit (INDEPENDENT_AMBULATORY_CARE_PROVIDER_SITE_OTHER): Payer: Medicare Other | Admitting: Neurology

## 2013-04-17 VITALS — BP 115/62 | HR 68 | Ht 67.0 in | Wt 160.0 lb

## 2013-04-17 DIAGNOSIS — M531 Cervicobrachial syndrome: Secondary | ICD-10-CM

## 2013-04-17 DIAGNOSIS — R269 Unspecified abnormalities of gait and mobility: Secondary | ICD-10-CM

## 2013-04-17 DIAGNOSIS — R51 Headache: Secondary | ICD-10-CM

## 2013-04-17 DIAGNOSIS — M47812 Spondylosis without myelopathy or radiculopathy, cervical region: Secondary | ICD-10-CM

## 2013-04-17 DIAGNOSIS — M5481 Occipital neuralgia: Secondary | ICD-10-CM

## 2013-04-17 HISTORY — DX: Occipital neuralgia: M54.81

## 2013-04-17 HISTORY — DX: Unspecified abnormalities of gait and mobility: R26.9

## 2013-04-17 MED ORDER — TRAZODONE HCL 100 MG PO TABS: 100.0000 mg | ORAL_TABLET | Freq: Every day | ORAL | Status: DC

## 2013-04-17 MED ORDER — METHOCARBAMOL 500 MG PO TABS: 500.0000 mg | ORAL_TABLET | Freq: Three times a day (TID) | ORAL | Status: DC | PRN

## 2013-04-17 NOTE — Progress Notes (Signed)
Reason for visit: Gait disorder  [name] is an 72 y.o. 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 This patient has in the past responded to occipital nerve injections, and she just recently had an injection 2 weeks prior to this evaluation. The patient indicates that the benefit usually lasts 4 or 5 months, but the more recent injections have not been quite as effective. The patient continues to have a left proximal leg weakness that has been present since 2011. The patient had a left femur fracture and a left total knee replacement. EMG and nerve conduction study evaluation done in 2011 revealed evidence of a mild peripheral neuropathy, but there was no evidence of a lumbosacral radiculopathy, plexopathy, or myopathy that would explain her left leg weakness. The patient uses a walker for ambulation, and she has not had any falls since last seen. The patient has normal pressure hydrocephalus, and she is being followed through Dr. Christella Noa for this. The patient does report some mild memory issues. The patient is on tizanidine as a muscle relaxant, but she does not believe that this has been extremely helpful. The patient takes the medication only as needed.  Past Medical History  Diagnosis Date  . Depression   . Hypertension   . Hyperlipidemia   . Rheumatoid arthritis(714.0)   . Osteomyelitis of left knee region   . Gait disorder   . Normal pressure hydrocephalus   . Cervical spondylosis   . Dyslipidemia   . Abnormality of gait 04/17/2013  . Occipital neuralgia 04/17/2013    right  . Cervicogenic headache     Right occipital neuralgia    Past Surgical History  Procedure Laterality Date  . Joint replacement    . Cervical spine surgery    . Ovary surgery    . Lumbar spine surgery    . Replacement total knee Left    . Ventriculo-peritoneal shunt placement / laparoscopic insertion peritoneal catheter      Family History  Problem Relation Age of Onset  . Ovarian cancer Mother   . Heart disease Father   . Brain cancer Sister     Social history:  reports that she has quit smoking. She has never used smokeless tobacco. She reports that she does not drink alcohol or use illicit drugs.  Allergies:  Allergies  Allergen Reactions  . Methotrexate Derivatives Other (See Comments)    Dangerously decreased platelet count  . Penicillins Itching  . Sulfa Antibiotics Itching    Medications:  Current Outpatient Prescriptions on File Prior to Visit  Medication Sig Dispense Refill  . acetaminophen (TYLENOL) 325 MG tablet Take 650 mg by mouth every 6 (six) hours as needed.        Marland Kitchen amLODipine (NORVASC) 5 MG tablet Take 5 mg by mouth daily.        Marland Kitchen labetalol (NORMODYNE) 100 MG tablet Take 50 mg by mouth twice daily       . solifenacin (VESICARE) 5 MG tablet Take 10 mg by mouth daily.         No current facility-administered medications on file prior to visit.    ROS:  Out of a complete 14 system review of symptoms, the patient complains only of the following symptoms, and all other reviewed systems are negative.  Fatigue Achy muscles Gait disorder  Blood pressure 115/62, pulse 68, height  5\' 7"  (1.702 m), weight 160 lb (72.576 kg).  Physical Exam  General: The patient is alert and cooperative at the time of the examination.  Skin: No significant peripheral edema is noted. The left leg is shorter than the right.   Neurologic Exam  Cranial nerves: Facial symmetry is present. Speech is normal, no aphasia or dysarthria is noted. Extraocular movements are full. Visual fields are full.  Motor: The patient has good strength in all 4 extremities, with exception that there is 4-/5 strength proximally with the left leg.  Coordination: The patient has good finger-nose-finger and heel-to-shin  bilaterally, with the exception that the patient has difficulty performing heel-to-shin with the left leg.  Gait and station: The patient has a wide-based gait, walks with a walker. Tandem gait was not attempted. Romberg is negative, but is unsteady. No drift is seen.  Reflexes: Deep tendon reflexes are symmetric.   Assessment/Plan:  1. Gait disorder  2. Normal pressure hydrocephalus, VP shunt placement  3. Cervical spondylosis  4. Right occipital neuralgia  The patient will be given a trial on Robaxin as a muscle relaxant. The patient will be seen by Dr. Christella Noa in the near future. A prescription was given for trazodone for sleep. The patient will followup through this office in 6 or 7 months.  Jill Alexanders MD 04/17/2013 7:46 PM  [facility] [address] [place], [place] [address]  Phone [phone] Fax [phone]

## 2013-05-23 ENCOUNTER — Other Ambulatory Visit: Payer: Self-pay | Admitting: Neurosurgery

## 2013-05-23 DIAGNOSIS — G912 (Idiopathic) normal pressure hydrocephalus: Secondary | ICD-10-CM

## 2013-06-19 ENCOUNTER — Ambulatory Visit
Admission: RE | Admit: 2013-06-19 | Discharge: 2013-06-19 | Disposition: A | Discharge status: Home / Self Care | Payer: Medicare Other | Source: Ambulatory Visit | Attending: Neurosurgery | Admitting: Neurosurgery

## 2013-06-19 DIAGNOSIS — G912 (Idiopathic) normal pressure hydrocephalus: Secondary | ICD-10-CM

## 2013-08-02 ENCOUNTER — Telehealth: Payer: Self-pay | Admitting: Neurology

## 2013-08-02 ENCOUNTER — Other Ambulatory Visit: Payer: Self-pay | Admitting: Neurology

## 2013-08-02 DIAGNOSIS — M5481 Occipital neuralgia: Secondary | ICD-10-CM

## 2013-08-02 NOTE — Telephone Encounter (Signed)
Rx has already been sent

## 2013-08-05 ENCOUNTER — Other Ambulatory Visit: Payer: Self-pay | Admitting: Neurology

## 2013-08-05 DIAGNOSIS — M5481 Occipital neuralgia: Secondary | ICD-10-CM

## 2013-08-08 ENCOUNTER — Other Ambulatory Visit: Payer: Self-pay | Admitting: Neurology

## 2013-08-08 ENCOUNTER — Ambulatory Visit
Admission: RE | Admit: 2013-08-08 | Discharge: 2013-08-08 | Disposition: A | Discharge status: Home / Self Care | Payer: Medicare Other | Source: Ambulatory Visit | Attending: Neurology | Admitting: Neurology

## 2013-08-08 VITALS — BP 167/69 | HR 67

## 2013-08-08 DIAGNOSIS — M5481 Occipital neuralgia: Secondary | ICD-10-CM

## 2013-08-08 MED ORDER — IOHEXOL 300 MG/ML  SOLN
1.0000 mL | Freq: Once | INTRAMUSCULAR | Status: AC | PRN
  Administered 2013-08-08: 1 mL via EPIDURAL

## 2013-08-08 MED ORDER — DEXAMETHASONE SODIUM PHOSPHATE 4 MG/ML IJ SOLN
4.0000 mg | Freq: Once | INTRAMUSCULAR | Status: AC
  Administered 2013-08-08: 4 mg

## 2013-08-17 ENCOUNTER — Other Ambulatory Visit: Payer: Self-pay

## 2013-09-22 ENCOUNTER — Telehealth: Payer: Self-pay

## 2013-09-22 NOTE — Telephone Encounter (Signed)
[employer] sent [place] a letter saying they have approved our request for coverage on Methocarbamol effective until 09/11/2014.

## 2013-10-31 ENCOUNTER — Encounter: Payer: Self-pay | Admitting: Neurology

## 2013-10-31 ENCOUNTER — Ambulatory Visit (INDEPENDENT_AMBULATORY_CARE_PROVIDER_SITE_OTHER): Payer: Medicare Other | Admitting: Neurology

## 2013-10-31 VITALS — BP 141/75 | HR 74 | Ht 66.75 in | Wt 152.0 lb

## 2013-10-31 DIAGNOSIS — R269 Unspecified abnormalities of gait and mobility: Secondary | ICD-10-CM

## 2013-10-31 DIAGNOSIS — M5481 Occipital neuralgia: Secondary | ICD-10-CM

## 2013-10-31 DIAGNOSIS — M47812 Spondylosis without myelopathy or radiculopathy, cervical region: Secondary | ICD-10-CM

## 2013-10-31 DIAGNOSIS — M531 Cervicobrachial syndrome: Secondary | ICD-10-CM

## 2013-10-31 NOTE — Progress Notes (Signed)
Reason for visit: Occipital neuralgia  [name] is an 73 y.o. female  History of present illness:  [name] is a 73 year old right-handed white female with a history of normal pressure hydrocephalus, status post VP shunt placement. The patient has had hip and femur surgery on the left with a leg length discrepancy. The patient has significant weakness of the left leg with hip flexion and with a footdrop on the left. The patient has mild weakness with hip flexion on the right. The patient had EMG nerve conduction studies done prior to her initial visit through this office in 2011. The patient believes that her leg weakness has gotten a little bit worse as times has gone on. The patient walks with a walker, and she has not had any falls. The patient is currently in physical therapy. The patient uses Robaxin for her right neck and shoulder pain. Her right occipital pain has been helped by nerve injections in the occipital nerve, the last injection was in October of 2014. The patient denies problems controlling the bowels or the bladder. The patient returns for an evaluation.  Past Medical History  Diagnosis Date  . Depression   . Hypertension   . Hyperlipidemia   . Rheumatoid arthritis(714.0)   . Osteomyelitis of left knee region   . Gait disorder   . Normal pressure hydrocephalus   . Cervical spondylosis   . Dyslipidemia   . Abnormality of gait 04/17/2013  . Occipital neuralgia 04/17/2013    right  . Cervicogenic headache     Right occipital neuralgia    Past Surgical History  Procedure Laterality Date  . Joint replacement    . Cervical spine surgery    . Ovary surgery    . Lumbar spine surgery    . Replacement total knee Left   . Ventriculo-peritoneal shunt placement / laparoscopic insertion peritoneal catheter      Family History  Problem Relation Age of Onset  . Ovarian cancer Mother   . Heart disease Father   . Brain cancer Sister     Social history:  reports that  she has quit smoking. She has never used smokeless tobacco. She reports that she does not drink alcohol or use illicit drugs.    Allergies  Allergen Reactions  . Methotrexate Derivatives Other (See Comments)    Dangerously decreased platelet count  . Penicillins Itching  . Sulfa Antibiotics Itching    Medications:  Current Outpatient Prescriptions on File Prior to Visit  Medication Sig Dispense Refill  . acetaminophen (TYLENOL) 325 MG tablet Take 650 mg by mouth every 6 (six) hours as needed.        Marland Kitchen amLODipine (NORVASC) 5 MG tablet Take 5 mg by mouth daily.        . Biotin (BIOTIN 5000) 5 MG CAPS Take 5,000 capsules by mouth daily.      . Calcium-Vitamin D-Vitamin K (CALCIUM SOFT CHEWS PO) Take by mouth daily.      . Coenzyme Q10 (CO Q-10) 100 MG CAPS Take 100 capsules by mouth daily.      Marland Kitchen labetalol (NORMODYNE) 100 MG tablet Take 50 mg by mouth twice daily       . leflunomide (ARAVA) 10 MG tablet Take 10 mg by mouth daily.      . methocarbamol (ROBAXIN) 500 MG tablet TAKE 1 TABLET BY MOUTH THREE TIMES DAILY AS NEEDED  90 tablet  3  . Multiple Vitamin (MULTIVITAMIN) tablet Take 1 tablet by mouth daily.      Marland Kitchen  RED YEAST RICE EXTRACT PO Take 1,200 capsules by mouth daily.      . traZODone (DESYREL) 100 MG tablet Take 1 tablet (100 mg total) by mouth at bedtime.  90 tablet  3  . tizanidine (ZANAFLEX) 2 MG capsule Take 2 mg by mouth 3 (three) times daily.       No current facility-administered medications on file prior to visit.    ROS:  Out of a complete 14 system review of symptoms, the patient complains only of the following symptoms, and all other reviewed systems are negative.  Neck pain, neck stiffness Insomnia Urinary urgency Joint pain, back pain, walking difficulties Headache  Blood pressure 141/75, pulse 74, height 5' 6.75" (1.695 m), weight 152 lb (68.947 kg).  Physical Exam  General: The patient is alert and cooperative at the time of the  examination.  Neuromuscular: A significant leg length discrepancy is noted between the right and left femur, the left being shorter.  Skin: No significant peripheral edema is noted.   Neurologic Exam  Mental status: The patient is oriented x 3.  Cranial nerves: Facial symmetry is present. Speech is normal, no aphasia or dysarthria is noted. Extraocular movements are full. Visual fields are full.  Motor: The patient has good strength in all 4 extremities, with exception of 4+/5 strength proximally on the right leg, 3/5 strength proximally on the left leg, and 4/5 strength with dorsiflexion of the left foot.  Sensory examination: Soft touch sensation of the face, arms, and legs is symmetric.  Coordination: The patient has good finger-nose-finger and heel-to-shin bilaterally.  Gait and station: The patient walks with a walker,  Gait is slightly wide-based, but stable with the walker. Tandem gait was not attempted. Romberg is negative. No drift is seen.  Reflexes: Deep tendon reflexes are symmetric.   Assessment/Plan:  1. Right occipital neuralgia  2. Cervical spondylosis, right neck and shoulder pain  3. Gait disorder, bilateral proximal leg weakness, left footdrop  4. Normal pressure hydrocephalus, VP shunt placement  5. Memory disturbance  The patient will be set up for nerve conduction studies of both legs, EMG of the left leg. The patient believes that the left leg has gotten slightly weaker. The patient is in physical therapy for leg strengthening exercises. The patient will be set up for another right occipital nerve injection, these injections have been helpful in the past. The patient will followup for the EMG evaluation. I would wonder whether the patient may be able to benefit from a left AFO.  Jill Alexanders MD 10/31/2013 6:57 PM  [facility] [address] [place], [place] [address]  Phone [phone] Fax [phone]

## 2013-10-31 NOTE — Patient Instructions (Signed)

## 2013-11-01 ENCOUNTER — Other Ambulatory Visit: Payer: Self-pay | Admitting: Neurology

## 2013-11-01 DIAGNOSIS — M5481 Occipital neuralgia: Secondary | ICD-10-CM

## 2013-11-03 ENCOUNTER — Ambulatory Visit
Admission: RE | Admit: 2013-11-03 | Discharge: 2013-11-03 | Disposition: A | Discharge status: Home / Self Care | Payer: Medicare Other | Source: Ambulatory Visit | Attending: Neurology | Admitting: Neurology

## 2013-11-03 DIAGNOSIS — M5481 Occipital neuralgia: Secondary | ICD-10-CM

## 2013-11-03 MED ORDER — DEXAMETHASONE SODIUM PHOSPHATE 4 MG/ML IJ SOLN
5.0000 mg | Freq: Once | INTRAMUSCULAR | Status: AC
  Administered 2013-11-03: 5.2 mg

## 2013-11-03 MED ORDER — IOHEXOL 300 MG/ML  SOLN: 1.0000 mL | Freq: Once | INTRAMUSCULAR | Status: AC | PRN

## 2013-11-03 NOTE — Discharge Instructions (Signed)

## 2013-11-20 ENCOUNTER — Encounter (INDEPENDENT_AMBULATORY_CARE_PROVIDER_SITE_OTHER): Payer: Self-pay

## 2013-11-20 ENCOUNTER — Ambulatory Visit (INDEPENDENT_AMBULATORY_CARE_PROVIDER_SITE_OTHER): Payer: Medicare Other | Admitting: Neurology

## 2013-11-20 DIAGNOSIS — G5730 Lesion of lateral popliteal nerve, unspecified lower limb: Secondary | ICD-10-CM

## 2013-11-20 DIAGNOSIS — R269 Unspecified abnormalities of gait and mobility: Secondary | ICD-10-CM

## 2013-11-20 DIAGNOSIS — Z0289 Encounter for other administrative examinations: Secondary | ICD-10-CM

## 2013-11-20 DIAGNOSIS — M5481 Occipital neuralgia: Secondary | ICD-10-CM

## 2013-11-20 DIAGNOSIS — G629 Polyneuropathy, unspecified: Secondary | ICD-10-CM | POA: Insufficient documentation

## 2013-11-20 NOTE — Progress Notes (Signed)
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 The patient has reported some change in the leg strength beginning several months ago. The patient is undergoing physical therapy for this. The patient has weakness with hip flexion on the left, mild weakness on the right as well. The patient has a footdrop on the left. The patient being evaluated for the footdrop.  Nerve conduction studies done today show evidence of a left peroneal neuropathy. EMG of the left leg confirms the presence of a peroneal neuropathy with evidence of an overlying chronic stable L5 radiculopathy as well. No denervation seen in the quadriceps muscles or iliopsoas muscle to explain the hip flexion weakness.  The patient will be sent for a repeat MRI the cervical spine. The patient will otherwise followup in 4 months.     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  Central and leftward displacement of bone at C5-6 flattens the  cord; canal diameter measures 5-7 mm.  Central protrusion at C6-7 with right greater than left extension;  in addition to cord flattening, right C7 neural encroachment may be  symptomatic.

## 2013-11-20 NOTE — Procedures (Signed)
     HISTORY:  [name] this is a 73 year old patient with a history of cervical spine surgery and cervical spinal stenosis. The patient has had a recent progression of weakness of the lower extremities, primarily with the left leg. The patient has weakness with a footdrop and with hip flexion. The patient is being evaluated for this weakness.  NERVE CONDUCTION STUDIES:  Nerve conduction studies were performed on both lower extremities. The distal motor latencies for the peroneal nerves were normal bilaterally, with a low motor amplitude on the left, normal on the right. The distal motor latencies and motor amplitudes for the posterior tibial nerves were normal bilaterally. The nerve conduction velocities for the peroneal and posterior tibial nerves were normal bilaterally. The H reflex latencies were normal bilaterally. The peroneal sensory latencies were absent on the left, and normal on the right.  EMG STUDIES:  EMG study was performed on the left lower extremity:  The tibialis anterior muscle reveals 2 to 5K motor units with decreased recruitment. No fibrillations or positive waves were seen. The peroneus tertius muscle reveals 2 to 5K motor units with moderately decreased recruitment. No fibrillations or positive waves were seen. The medial gastrocnemius muscle reveals 1 to 3K motor units with full recruitment. No fibrillations or positive waves were seen. The vastus lateralis muscle reveals 2 to 4K motor units with full recruitment. No fibrillations or positive waves were seen. The iliopsoas muscle reveals 2 to 4K motor units with full recruitment. No fibrillations or positive waves were seen. Complex repetitive discharges were seen. The biceps femoris muscle (long head) reveals 2 to 5K motor units with decreased recruitment. No fibrillations or positive waves were seen. The lumbosacral paraspinal muscles were tested at 3 levels, and revealed no abnormalities of insertional activity at  all 3 levels tested. There was good relaxation.   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 EMG evaluation of the left lower extremity shows findings consistent with a left peroneal neuropathy, but there is evidence as well of an overlying chronic stable L5 radiculopathy. No other significant abnormalities were seen.  Jill Alexanders MD 11/20/2013 1:47 PM  [facility] [address] [place], [place] [address]  Phone [phone] Fax [phone]

## 2013-12-05 ENCOUNTER — Telehealth: Payer: Self-pay | Admitting: Neurology

## 2013-12-05 ENCOUNTER — Ambulatory Visit
Admission: RE | Admit: 2013-12-05 | Discharge: 2013-12-05 | Disposition: A | Discharge status: Home / Self Care | Payer: Medicare Other | Source: Ambulatory Visit | Attending: Neurology | Admitting: Neurology

## 2013-12-05 DIAGNOSIS — R269 Unspecified abnormalities of gait and mobility: Secondary | ICD-10-CM

## 2013-12-05 NOTE — Telephone Encounter (Signed)
  I called patient. The MRI the cervical spine has not changed over the last 3 years. EMG evaluation did not show any acute changes that would be increasing the left leg weakness. The patient is in physical therapy, and she will need work on leg strengthening exercises and balance issues. I'll see her back in 3 or 4 months.  MRI cervical spine 12/05/2013:  IMPRESSION:  Abnormal MRI cervical spine (without) demonstrating:  1. At C4-C5-C6, there is anterior cervical interbody fusion with metal hardware.  2. At C5-6: uncovertebral joint hypertrophy with mild spinal stenosis but no foraminal stenosis.  2. At C6-7: rightward disc bulging with mild spinal stenosis and moderate right foraminal stenosis  3. At C7-T1: rightward disc bulging with mild right foraminal stenosis  4. At T2-3: disc bulging and facet hypertrophy with mild right foraminal stenosis  5. At T3-4: uncovertebral joint hypertrophy and facet hypertrophy with moderate right foraminal stenosis  6. No significant change from MRI on 01/08/11.

## 2013-12-09 ENCOUNTER — Other Ambulatory Visit: Payer: Self-pay | Admitting: Neurology

## 2013-12-11 ENCOUNTER — Telehealth: Payer: Self-pay | Admitting: *Deleted

## 2013-12-11 NOTE — Telephone Encounter (Signed)
Called patient to schedule May f/u with Dr. Jannifer Franklin. Left msg.

## 2013-12-14 ENCOUNTER — Telehealth: Payer: Self-pay | Admitting: Neurology

## 2013-12-14 NOTE — Telephone Encounter (Signed)
Pt called states she was advised after having her MRI she was told to schedule 1 month f/u with Dr. Jannifer Franklin. Please contact pt concerning this matter to see if you have something sooner.

## 2013-12-15 NOTE — Telephone Encounter (Signed)
My last note indicates a revisit for 4 months, not 4 weeks.

## 2013-12-15 NOTE — Telephone Encounter (Signed)
Patient called stating that she was told to make an appt for 1 month f/u with Dr. Jannifer Franklin after her MRI was done. Patient stated that she has already had this done. Dr. Jannifer Franklin you do not have any opening in a month. Please advise

## 2013-12-18 ENCOUNTER — Telehealth: Payer: Self-pay | Admitting: Neurology

## 2013-12-18 NOTE — Telephone Encounter (Signed)
Called patient to schedule appt for 03/02/14 with Dr. Jannifer Franklin. I advised the patient that if she has any other problems, questions or concerns to call the office. Patient verbalized understanding.

## 2013-12-18 NOTE — Telephone Encounter (Signed)
Called patient and left message to call back to make an 4 month f/u appt with Dr. Jannifer Franklin.

## 2013-12-20 NOTE — Telephone Encounter (Signed)
Patient was already scheduled. 

## 2014-02-26 ENCOUNTER — Telehealth: Payer: Self-pay | Admitting: Neurology

## 2014-02-26 DIAGNOSIS — M47812 Spondylosis without myelopathy or radiculopathy, cervical region: Secondary | ICD-10-CM

## 2014-02-26 NOTE — Telephone Encounter (Signed)
Patient requesting an order sent for nerve block of cervical spine.  Please call and advise

## 2014-02-27 NOTE — Telephone Encounter (Signed)
I called patient. She last had an epidural steroid injection of the cervical spine in January 2015. She has gained several months of benefit, but the pain has now returned. I'll get this set up again. The patient will be seen as a revisit this week.

## 2014-02-28 ENCOUNTER — Other Ambulatory Visit: Payer: Self-pay | Admitting: Neurology

## 2014-02-28 DIAGNOSIS — M47812 Spondylosis without myelopathy or radiculopathy, cervical region: Secondary | ICD-10-CM

## 2014-03-02 ENCOUNTER — Ambulatory Visit (INDEPENDENT_AMBULATORY_CARE_PROVIDER_SITE_OTHER): Payer: Medicare Other | Admitting: Neurology

## 2014-03-02 ENCOUNTER — Encounter: Payer: Self-pay | Admitting: Neurology

## 2014-03-02 VITALS — BP 138/64 | HR 62 | Wt 148.0 lb

## 2014-03-02 DIAGNOSIS — M47812 Spondylosis without myelopathy or radiculopathy, cervical region: Secondary | ICD-10-CM

## 2014-03-02 DIAGNOSIS — M531 Cervicobrachial syndrome: Secondary | ICD-10-CM

## 2014-03-02 DIAGNOSIS — R269 Unspecified abnormalities of gait and mobility: Secondary | ICD-10-CM

## 2014-03-02 DIAGNOSIS — M5481 Occipital neuralgia: Secondary | ICD-10-CM

## 2014-03-02 NOTE — Patient Instructions (Signed)
Musculoskeletal Pain °Musculoskeletal pain is muscle and boney aches and pains. These pains can occur in any part of the body. Your caregiver may treat you without knowing the cause of the pain. They may treat you if blood or urine tests, X-rays, and other tests were normal.  °CAUSES °There is often not a definite cause or reason for these pains. These pains may be caused by a type of germ (virus). The discomfort may also come from overuse. Overuse includes working out too hard when your body is not fit. Boney aches also come from weather changes. Bone is sensitive to atmospheric pressure changes. °HOME CARE INSTRUCTIONS  °· Ask when your test results will be ready. Make sure you get your test results. °· Only take over-the-counter or prescription medicines for pain, discomfort, or fever as directed by your caregiver. If you were given medications for your condition, do not drive, operate machinery or power tools, or sign legal documents for 24 hours. Do not drink alcohol. Do not take sleeping pills or other medications that may interfere with treatment. °· Continue all activities unless the activities cause more pain. When the pain lessens, slowly resume normal activities. Gradually increase the intensity and duration of the activities or exercise. °· During periods of severe pain, bed rest may be helpful. Lay or sit in any position that is comfortable. °· Putting ice on the injured area. °· Put ice in a bag. °· Place a towel between your skin and the bag. °· Leave the ice on for 15 to 20 minutes, 3 to 4 times a day. °· Follow up with your caregiver for continued problems and no reason can be found for the pain. If the pain becomes worse or does not go away, it may be necessary to repeat tests or do additional testing. Your caregiver may need to look further for a possible cause. °SEEK IMMEDIATE MEDICAL CARE IF: °· You have pain that is getting worse and is not relieved by medications. °· You develop chest pain  that is associated with shortness or breath, sweating, feeling sick to your stomach (nauseous), or throw up (vomit). °· Your pain becomes localized to the abdomen. °· You develop any new symptoms that seem different or that concern you. °MAKE SURE YOU:  °· Understand these instructions. °· Will watch your condition. °· Will get help right away if you are not doing well or get worse. °Document Released: 09/28/2005 Document Revised: 12/21/2011 Document Reviewed: 06/02/2013 °[employer]® Patient Information ©2014 [employer]. ° °

## 2014-03-02 NOTE — Progress Notes (Signed)
Reason for visit: Cervical spondylosis  [name] is an 73 y.o. female  History of present illness:  [name] is a 73 year old right-handed white female with a history of normal pressure hydrocephalus, gait disorder, and cervical spondylosis. The patient has chronic neck discomfort, but the occipital neuralgia component of the pain has improved. She gets intermittent cervical epidural steroid injections, with the last injection in January 2015. She gets several months of benefit from the injections. She is walking with a walker, and if she is with her husband, she occasionally will walk with a cane. She denies any falls. The patient does have proximal muscle weakness of the legs bilaterally, left greater than right. She denies any new medical issues that have come up since last seen. She is due for another injection in the next several days in the cervical area. She indicates that she sleeps well at night, takes trazodone 50 mg every night.  Past Medical History  Diagnosis Date  . Depression   . Hypertension   . Hyperlipidemia   . Rheumatoid arthritis(714.0)   . Osteomyelitis of left knee region   . Gait disorder   . Normal pressure hydrocephalus   . Cervical spondylosis   . Dyslipidemia   . Abnormality of gait 04/17/2013  . Occipital neuralgia 04/17/2013    right  . Cervicogenic headache     Right occipital neuralgia    Past Surgical History  Procedure Laterality Date  . Joint replacement    . Cervical spine surgery    . Ovary surgery    . Lumbar spine surgery    . Replacement total knee Left   . Ventriculo-peritoneal shunt placement / laparoscopic insertion peritoneal catheter      Family History  Problem Relation Age of Onset  . Ovarian cancer Mother   . Heart disease Father   . Brain cancer Sister     Social history:  reports that she has quit smoking. She has never used smokeless tobacco. She reports that she does not drink alcohol or use illicit drugs.      Allergies  Allergen Reactions  . Methotrexate Derivatives Other (See Comments)    Dangerously decreased platelet count  . Penicillins Itching  . Sulfa Antibiotics Itching    Medications:  Current Outpatient Prescriptions on File Prior to Visit  Medication Sig Dispense Refill  . acetaminophen (TYLENOL) 325 MG tablet Take 650 mg by mouth every 6 (six) hours as needed.        Marland Kitchen amLODipine (NORVASC) 5 MG tablet Take 5 mg by mouth daily.        . Biotin (BIOTIN 5000) 5 MG CAPS Take 5,000 capsules by mouth daily.      . Calcium-Vitamin D-Vitamin K (CALCIUM SOFT CHEWS PO) Take by mouth daily.      Marland Kitchen labetalol (NORMODYNE) 100 MG tablet Take 50 mg by mouth twice daily       . leflunomide (ARAVA) 10 MG tablet Take 10 mg by mouth daily.      . methocarbamol (ROBAXIN) 500 MG tablet TAKE 1 TABLET BY MOUTH THREE TIMES DAILY AS NEEDED  90 tablet  6  . Multiple Vitamin (MULTIVITAMIN) tablet Take 1 tablet by mouth daily.      . traZODone (DESYREL) 100 MG tablet Take 1 tablet (100 mg total) by mouth at bedtime.  90 tablet  3   No current facility-administered medications on file prior to visit.    ROS:  Out of a complete 14 system review  of symptoms, the patient complains only of the following symptoms, and all other reviewed systems are negative.  Joint pain, back pain, neck pain, neck stiffness Gait disorder  Blood pressure 138/64, pulse 62, weight 148 lb (67.132 kg).  Physical Exam  General: The patient is alert and cooperative at the time of the examination.  Skin: No significant peripheral edema is noted.   Neurologic Exam  Mental status: The patient is oriented x 3.  Cranial nerves: Facial symmetry is present. Speech is normal, no aphasia or dysarthria is noted. Extraocular movements are full. Visual fields are full.  Motor: The patient has good strength in all 4 extremities with exception that there is 4 minus/5 strength proximally on the left leg, 4/5 strength proximal on the  right leg.  Sensory examination: Soft touch sensation is symmetric on the face, arms, and legs.  Coordination: The patient has good finger-nose-finger and heel-to-shin bilaterally. Some apraxia with the use of the lower extremities is noted.  Gait and station: The patient has a wide-based, unsteady gait. She walks with a walker. Tandem gait was not attempted. Romberg is negative. No drift is seen.  Reflexes: Deep tendon reflexes are symmetric.   Assessment/Plan:  1. Normal pressure hydrocephalus  2. Gait disorder  3. Cervical spondylosis, chronic neck discomfort  The patient will be getting an epidural steroid injection in the neck in the next several days. She is being safe with walking, doing relatively well in this regard. The patient does have proximal muscle weakness of the legs bilaterally. She will followup through this office in 8 months.  Jill Alexanders MD 03/05/2014 11:58 AM  [facility] [address] [place], [place] [address]  Phone [phone] Fax [phone]

## 2014-03-14 ENCOUNTER — Ambulatory Visit
Admission: RE | Admit: 2014-03-14 | Discharge: 2014-03-14 | Disposition: A | Discharge status: Home / Self Care | Payer: Medicare Other | Source: Ambulatory Visit | Attending: Neurology | Admitting: Neurology

## 2014-03-14 DIAGNOSIS — M47812 Spondylosis without myelopathy or radiculopathy, cervical region: Secondary | ICD-10-CM

## 2014-03-14 MED ORDER — TRIAMCINOLONE ACETONIDE 40 MG/ML IJ SUSP (RADIOLOGY)
60.0000 mg | Freq: Once | INTRAMUSCULAR | Status: AC
  Administered 2014-03-14: 60 mg via EPIDURAL

## 2014-03-14 MED ORDER — IOHEXOL 300 MG/ML  SOLN
1.0000 mL | Freq: Once | INTRAMUSCULAR | Status: AC | PRN
  Administered 2014-03-14: 1 mL via EPIDURAL

## 2014-03-14 NOTE — Discharge Instructions (Signed)

## 2014-08-12 ENCOUNTER — Other Ambulatory Visit: Payer: Self-pay | Admitting: Neurology

## 2014-09-10 ENCOUNTER — Telehealth: Payer: Self-pay | Admitting: Neurology

## 2014-09-10 MED ORDER — TRAZODONE HCL 100 MG PO TABS: 100.0000 mg | ORAL_TABLET | Freq: Every day | ORAL | Status: DC

## 2014-09-10 NOTE — Telephone Encounter (Signed)
Rx has been sent  

## 2014-09-10 NOTE — Telephone Encounter (Signed)
Patient is calling to get a refill for Trazadone 100mg . [facility] advised patient to call us.

## 2014-09-18 ENCOUNTER — Encounter: Payer: Self-pay | Admitting: Neurology

## 2014-09-18 ENCOUNTER — Ambulatory Visit (INDEPENDENT_AMBULATORY_CARE_PROVIDER_SITE_OTHER): Payer: Medicare Other | Admitting: Neurology

## 2014-09-18 VITALS — BP 153/69 | HR 62 | Ht 67.0 in | Wt 152.0 lb

## 2014-09-18 DIAGNOSIS — R269 Unspecified abnormalities of gait and mobility: Secondary | ICD-10-CM

## 2014-09-18 DIAGNOSIS — G5730 Lesion of lateral popliteal nerve, unspecified lower limb: Secondary | ICD-10-CM

## 2014-09-18 DIAGNOSIS — M5481 Occipital neuralgia: Secondary | ICD-10-CM

## 2014-09-18 DIAGNOSIS — R29898 Other symptoms and signs involving the musculoskeletal system: Secondary | ICD-10-CM

## 2014-09-18 NOTE — Progress Notes (Signed)
Reason for visit: Gait disorder  [name] is an 73 y.o. female  History of present illness:  [name] is a 73 year old right-handed white female with a history of cervical spine surgery. The patient has a chronic gait disorder, but she believes that this issue has continued to worsen. She has bilateral proximal muscle weakness of the legs that has been well documented in the past, and is more significant on the left than the right. The patient feels that when she is trying to walk, she fatigues with the legs more rapidly, and she has to stop and rest every 25 yards. The patient denies any low back pain or pain down the legs. EMG and nerve conduction study done in February 2015 did not show evidence of denervation proximally in the legs, and no evidence of a myopathic disorder. The patient has not had any falls since last seen in May 2015. The patient walks with a walker, and she seemed to have good stability with this. She denies any issues controlling the bowels or the bladder. She returns to this office for an evaluation. There has been no numbness in the lower extremities.  Past Medical History  Diagnosis Date  . Depression   . Hypertension   . Hyperlipidemia   . Rheumatoid arthritis(714.0)   . Osteomyelitis of left knee region   . Gait disorder   . Normal pressure hydrocephalus   . Cervical spondylosis   . Dyslipidemia   . Abnormality of gait 04/17/2013  . Occipital neuralgia 04/17/2013    right  . Cervicogenic headache     Right occipital neuralgia    Past Surgical History  Procedure Laterality Date  . Joint replacement    . Cervical spine surgery    . Ovary surgery    . Lumbar spine surgery    . Replacement total knee Left   . Ventriculo-peritoneal shunt placement / laparoscopic insertion peritoneal catheter      Family History  Problem Relation Age of Onset  . Ovarian cancer Mother   . Heart disease Father   . Brain cancer Sister     Social history:  reports  that she has quit smoking. She has never used smokeless tobacco. She reports that she does not drink alcohol or use illicit drugs.    Allergies  Allergen Reactions  . Methotrexate Derivatives Other (See Comments)    Dangerously decreased platelet count  . Penicillins Itching  . Sulfa Antibiotics Itching    Medications:  Current Outpatient Prescriptions on File Prior to Visit  Medication Sig Dispense Refill  . acetaminophen (TYLENOL) 325 MG tablet Take 650 mg by mouth every 6 (six) hours as needed.      Marland Kitchen amLODipine (NORVASC) 5 MG tablet Take 5 mg by mouth daily.      Raelyn Ensign Pollen 550 MG CAPS Take 2 capsules by mouth daily.    . Biotin (BIOTIN 5000) 5 MG CAPS Take 5,000 capsules by mouth daily.    . Calcium-Vitamin D-Vitamin K (CALCIUM SOFT CHEWS PO) Take by mouth daily.    Marland Kitchen labetalol (NORMODYNE) 100 MG tablet Take 50 mg by mouth twice daily     . leflunomide (ARAVA) 10 MG tablet Take 10 mg by mouth daily.    . methocarbamol (ROBAXIN) 500 MG tablet TAKE 1 TABLET BY MOUTH THREE TIMES DAILY AS NEEDED 90 tablet 2  . Multiple Vitamin (MULTIVITAMIN) tablet Take 1 tablet by mouth daily.    . traZODone (DESYREL) 100 MG tablet Take  1 tablet (100 mg total) by mouth at bedtime. 90 tablet 1   No current facility-administered medications on file prior to visit.    ROS:  Out of a complete 14 system review of symptoms, the patient complains only of the following symptoms, and all other reviewed systems are negative.  Frequent waking Frequency of urination Joint pain Achy muscles, walking difficulty, neck stiffness Headache, weakness  Blood pressure 153/69, pulse 62, height 5\' 7"  (1.702 m), weight 152 lb (68.947 kg).  Physical Exam  General: The patient is alert and cooperative at the time of the examination.  Skin: No significant peripheral edema is noted.   Neurologic Exam  Mental status: The patient is oriented x 3.  Cranial nerves: Facial symmetry is present. Speech is normal,  no aphasia or dysarthria is noted. Extraocular movements are full. Visual fields are full.  Motor: The patient has good strength in all 4 extremities, with exception that there is 3/5 strength approximate with the left leg with hip flexion, 4/5 strength in the right leg with hip flexion.  Sensory examination: Soft touch sensation is symmetric in the face, arms, and legs.  Coordination: The patient has good finger-nose-finger and heel-to-shin bilaterally, with exception that she is unable to perform heel-to-shin with the left leg.  Gait and station: The patient has a slightly wide-based gait, unsteady, the patient walks with a walker. Tandem gait was not attempted. Romberg is negative. No drift is seen.  Reflexes: Deep tendon reflexes are symmetric, but are depressed.   MRI cervical 12/05/13:  IMPRESSION:  Abnormal MRI cervical spine (without) demonstrating: 1. At C4-C5-C6, there is anterior cervical interbody fusion with metal hardware. 2. At C5-6: uncovertebral joint hypertrophy with mild spinal stenosis but no foraminal stenosis. 2. At C6-7: rightward disc bulging with mild spinal stenosis and moderate right foraminal stenosis  3. At C7-T1: rightward disc bulging with mild right foraminal stenosis  4. At T2-3: disc bulging and facet hypertrophy with mild right foraminal stenosis  5. At T3-4: uncovertebral joint hypertrophy and facet hypertrophy with moderate right foraminal stenosis  6. No significant change from MRI on 01/08/11.    Assessment/Plan:  1. Normal pressure hydrocephalus, status post VP shunt  2. Gait disorder, leg fatigue  3. Cervical spondylosis  The patient will be sent for MRI evaluation of the low back, and she had blood work done today. The clinical examination does not show a significant change from prior examinations. The patient clearly has bilateral proximal muscle weakness of the legs, more prominent with the left leg than the right. She will follow-up  in 6 months.   Jill Alexanders MD 09/18/2014 6:48 PM  [facility] [address] [place], [place] [address]  Phone [phone] Fax [phone]

## 2014-09-18 NOTE — Patient Instructions (Signed)

## 2014-09-27 LAB — CK: Total CK: 91 U/L (ref 24–173)

## 2014-09-27 LAB — ACETYLCHOLINE RECEPTOR, BINDING

## 2014-10-01 ENCOUNTER — Telehealth: Payer: Self-pay | Admitting: Neurology

## 2014-10-01 NOTE — Telephone Encounter (Signed)
Patient's husband is calling because patient needs pre certification for medication generic Robaxin 500mg  through [facility] telephone # [phone]. Please call patient and advise. Thank you.

## 2014-10-01 NOTE — Telephone Encounter (Signed)
I contacted ins.  They indicate this drug is covered, and no PA is required.  Ref # [id number].  I called the pharmacy.  Spoke with Diane.  She tried to process the Rx and says they are not getting any type of response from the ins.  Says they have just updated their system and there may be a snag.  She will try again in a few minutes and will call us back if they get a denial, otherwise will process Rx and notify patient when it's ready for pick up.  I called the patient back.  They are aware.

## 2014-10-09 ENCOUNTER — Ambulatory Visit
Admission: RE | Admit: 2014-10-09 | Discharge: 2014-10-09 | Disposition: A | Discharge status: Home / Self Care | Payer: Medicare Other | Source: Ambulatory Visit | Attending: Neurology | Admitting: Neurology

## 2014-10-09 DIAGNOSIS — R269 Unspecified abnormalities of gait and mobility: Secondary | ICD-10-CM

## 2014-10-09 DIAGNOSIS — M5481 Occipital neuralgia: Secondary | ICD-10-CM

## 2014-10-09 DIAGNOSIS — R29898 Other symptoms and signs involving the musculoskeletal system: Secondary | ICD-10-CM

## 2014-10-09 DIAGNOSIS — G5730 Lesion of lateral popliteal nerve, unspecified lower limb: Secondary | ICD-10-CM

## 2014-10-10 ENCOUNTER — Telehealth: Payer: Self-pay | Admitting: Neurology

## 2014-10-10 NOTE — Telephone Encounter (Signed)
   I called the patient. The MRI of the low back does not show significant spinal stenosis, and there is no evidence of severe neuroforaminal stenosis that would explain her progressive leg weakness. We have not looked at the thoracic spine, but the patient demonstrated no spasticity on the medical examination. EMG evaluation did not show evidence of a myopathic disorder, blood work has been unremarkable, and the cervical spine MRI was stable in February 2015. The patient has not had an evaluation of the thoracic spine, but she wishes to continue lower extremity strengthening exercises, she will call me if she believes that she continues to progress.   MRI lumbar 10/09/2014:  IMPRESSION:  Abnormal MRI lumbar spine (without) demonstrating: 1. At L2-3: disc bulging and facet hypertrophy with mild right and moderate left foraminal stenosis  2. At L3-4: disc bulging and facet hypertrophy with moderate right and mild left foraminal stenosis  3. At L4-5: facet hypertrophy with mild biforaminal stenosis  4. At L5-S1: right laminectomy and facet hypertrophy with mild biforaminal stenosis

## 2014-11-02 ENCOUNTER — Ambulatory Visit: Payer: Medicare Other | Admitting: Neurology

## 2014-11-20 ENCOUNTER — Telehealth: Payer: Self-pay | Admitting: Neurology

## 2014-11-20 MED ORDER — METHOCARBAMOL 500 MG PO TABS: 500.0000 mg | ORAL_TABLET | Freq: Three times a day (TID) | ORAL | Status: DC | PRN

## 2014-11-20 NOTE — Telephone Encounter (Signed)
Rx has been sent  

## 2014-11-20 NOTE — Telephone Encounter (Signed)
[place], Pharmacist Tech with [facility] @ [phone], requesting refill for Rx methocarbamol (ROBAXIN) 500 MG tablet.  Please call and advise.

## 2015-02-18 ENCOUNTER — Encounter (HOSPITAL_BASED_OUTPATIENT_CLINIC_OR_DEPARTMENT_OTHER): Payer: Self-pay

## 2015-02-18 ENCOUNTER — Emergency Department (HOSPITAL_COMMUNITY): Payer: Medicare Other

## 2015-02-18 ENCOUNTER — Emergency Department (HOSPITAL_BASED_OUTPATIENT_CLINIC_OR_DEPARTMENT_OTHER): Payer: Medicare Other

## 2015-02-18 ENCOUNTER — Emergency Department (HOSPITAL_BASED_OUTPATIENT_CLINIC_OR_DEPARTMENT_OTHER)
Admission: EM | Admit: 2015-02-18 | Discharge: 2015-02-19 | Disposition: A | Discharge status: Home / Self Care | Payer: Medicare Other | Attending: Emergency Medicine | Admitting: Emergency Medicine

## 2015-02-18 DIAGNOSIS — R112 Nausea with vomiting, unspecified: Secondary | ICD-10-CM | POA: Insufficient documentation

## 2015-02-18 DIAGNOSIS — Z982 Presence of cerebrospinal fluid drainage device: Secondary | ICD-10-CM | POA: Diagnosis not present

## 2015-02-18 DIAGNOSIS — R42 Dizziness and giddiness: Secondary | ICD-10-CM

## 2015-02-18 LAB — CBC WITH DIFFERENTIAL/PLATELET
Basophils Absolute: 0 K/uL (ref 0.0–0.1)
Basophils Relative: 0 % (ref 0–1)
Eosinophils Absolute: 0 K/uL (ref 0.0–0.7)
Eosinophils Relative: 0 % (ref 0–5)
HCT: 41.3 % (ref 36.0–46.0)
Hemoglobin: 13.7 g/dL (ref 12.0–15.0)
Lymphocytes Relative: 41 % (ref 12–46)
Lymphs Abs: 4.1 K/uL — ABNORMAL HIGH (ref 0.7–4.0)
MCH: 31.4 pg (ref 26.0–34.0)
MCHC: 33.2 g/dL (ref 30.0–36.0)
MCV: 94.5 fL (ref 78.0–100.0)
Monocytes Absolute: 0.5 K/uL (ref 0.1–1.0)
Monocytes Relative: 5 % (ref 3–12)
Neutro Abs: 5.5 K/uL (ref 1.7–7.7)
Neutrophils Relative %: 54 % (ref 43–77)
Platelets: 162 K/uL (ref 150–400)
RBC: 4.37 MIL/uL (ref 3.87–5.11)
RDW: 13.1 % (ref 11.5–15.5)
WBC: 10.1 K/uL (ref 4.0–10.5)

## 2015-02-18 LAB — BASIC METABOLIC PANEL
Anion gap: 9 (ref 5–15)
BUN: 16 mg/dL (ref 6–20)
CO2: 25 mmol/L (ref 22–32)
CREATININE: 0.64 mg/dL (ref 0.44–1.00)
Calcium: 9.6 mg/dL (ref 8.9–10.3)
Chloride: 104 mmol/L (ref 101–111)
Glucose, Bld: 156 mg/dL — ABNORMAL HIGH (ref 70–99)
POTASSIUM: 3.4 mmol/L — AB (ref 3.5–5.1)
Sodium: 138 mmol/L (ref 135–145)

## 2015-02-18 LAB — URINE MICROSCOPIC-ADD ON

## 2015-02-18 LAB — URINALYSIS, ROUTINE W REFLEX MICROSCOPIC
Bilirubin Urine: NEGATIVE
Glucose, UA: NEGATIVE mg/dL
HGB URINE DIPSTICK: NEGATIVE
Ketones, ur: 40 mg/dL — AB
Nitrite: NEGATIVE
PROTEIN: NEGATIVE mg/dL
Specific Gravity, Urine: 1.026 (ref 1.005–1.030)
Urobilinogen, UA: 1 mg/dL (ref 0.0–1.0)
pH: 6.5 (ref 5.0–8.0)

## 2015-02-18 LAB — TSH: TSH: 0.752 u[IU]/mL (ref 0.350–4.500)

## 2015-02-18 LAB — MAGNESIUM: Magnesium: 1.9 mg/dL (ref 1.7–2.4)

## 2015-02-18 LAB — TROPONIN I

## 2015-02-18 MED ORDER — ONDANSETRON HCL 4 MG/2ML IJ SOLN
4.0000 mg | Freq: Once | INTRAMUSCULAR | Status: AC
  Administered 2015-02-18: 4 mg via INTRAVENOUS

## 2015-02-18 MED ORDER — CIPROFLOXACIN IN D5W 400 MG/200ML IV SOLN
400.0000 mg | Freq: Once | INTRAVENOUS | Status: AC
  Administered 2015-02-18: 400 mg via INTRAVENOUS
  Filled 2015-02-18: qty 200

## 2015-02-18 MED ORDER — MECLIZINE HCL 25 MG PO TABS
25.0000 mg | ORAL_TABLET | Freq: Once | ORAL | Status: AC
  Administered 2015-02-18: 25 mg via ORAL
  Filled 2015-02-18: qty 1

## 2015-02-18 MED ORDER — SODIUM CHLORIDE 0.9 % IV BOLUS (SEPSIS)
1000.0000 mL | Freq: Once | INTRAVENOUS | Status: AC
  Administered 2015-02-18: 1000 mL via INTRAVENOUS

## 2015-02-18 MED ORDER — MECLIZINE HCL 25 MG PO TABS: 25.0000 mg | ORAL_TABLET | Freq: Three times a day (TID) | ORAL | Status: AC | PRN

## 2015-02-18 MED ORDER — ONDANSETRON HCL 4 MG/2ML IJ SOLN
INTRAMUSCULAR | Status: AC
  Filled 2015-02-18: qty 2

## 2015-02-18 MED ORDER — ONDANSETRON HCL 4 MG/2ML IJ SOLN
4.0000 mg | Freq: Once | INTRAMUSCULAR | Status: AC
  Administered 2015-02-18: 4 mg via INTRAVENOUS
  Filled 2015-02-18: qty 2

## 2015-02-18 MED ORDER — CIPROFLOXACIN HCL 500 MG PO TABS
500.0000 mg | ORAL_TABLET | Freq: Two times a day (BID) | ORAL | Status: DC
Start: 2015-02-18 — End: 2015-03-20

## 2015-02-18 MED ORDER — ONDANSETRON HCL 4 MG PO TABS: 4.0000 mg | ORAL_TABLET | Freq: Four times a day (QID) | ORAL | Status: DC

## 2015-02-18 NOTE — ED Notes (Signed)
Pt. Reports no shortness of breath with dizziness and no shortness of breath with vomiting and no shortness of breath with anything the past two days.  Pt. Reports she has had na sea and small amt of vomiting.

## 2015-02-18 NOTE — Discharge Instructions (Signed)
The cause of your dizziness was not identified today.  Please follow up with your Neurologist for further testing.  Get rechecked immediately if you develop chest pain, new weakness, or new concerning symptoms.  The MRI of your brain showed that the shunt may not be fully addressing your hydrocephalus - please contact your Neurosurgeon for follow up.     Dizziness Dizziness is a common problem. It is a feeling of unsteadiness or light-headedness. You may feel like you are about to faint. Dizziness can lead to injury if you stumble or fall. A person of any age group can suffer from dizziness, but dizziness is more common in older adults. CAUSES  Dizziness can be caused by many different things, including:  Middle ear problems.  Standing for too long.  Infections.  An allergic reaction.  Aging.  An emotional response to something, such as the sight of blood.  Side effects of medicines.  Tiredness.  Problems with circulation or blood pressure.  Excessive use of alcohol or medicines, or illegal drug use.  Breathing too fast (hyperventilation).  An irregular heart rhythm (arrhythmia).  A low red blood cell count (anemia).  Pregnancy.  Vomiting, diarrhea, fever, or other illnesses that cause body fluid loss (dehydration).  Diseases or conditions such as Parkinson's disease, high blood pressure (hypertension), diabetes, and thyroid problems.  Exposure to extreme heat. DIAGNOSIS  Your health care provider will ask about your symptoms, perform a physical exam, and perform an electrocardiogram (ECG) to record the electrical activity of your heart. Your health care provider may also perform other heart or blood tests to determine the cause of your dizziness. These may include:  Transthoracic echocardiogram (TTE). During echocardiography, sound waves are used to evaluate how blood flows through your heart.  Transesophageal echocardiogram (TEE).  Cardiac monitoring. This allows  your health care provider to monitor your heart rate and rhythm in real time.  Holter monitor. This is a portable device that records your heartbeat and can help diagnose heart arrhythmias. It allows your health care provider to track your heart activity for several days if needed.  Stress tests by exercise or by giving medicine that makes the heart beat faster. TREATMENT  Treatment of dizziness depends on the cause of your symptoms and can vary greatly. HOME CARE INSTRUCTIONS   Drink enough fluids to keep your urine clear or pale yellow. This is especially important in very hot weather. In older adults, it is also important in cold weather.  Take your medicine exactly as directed if your dizziness is caused by medicines. When taking blood pressure medicines, it is especially important to get up slowly.  Rise slowly from chairs and steady yourself until you feel okay.  In the morning, first sit up on the side of the bed. When you feel okay, stand slowly while holding onto something until you know your balance is fine.  Move your legs often if you need to stand in one place for a long time. Tighten and relax your muscles in your legs while standing.  Have someone stay with you for 1-2 days if dizziness continues to be a problem. Do this until you feel you are well enough to stay alone. Have the person call your health care provider if he or she notices changes in you that are concerning.  Do not drive or use heavy machinery if you feel dizzy.  Do not drink alcohol. SEEK IMMEDIATE MEDICAL CARE IF:   Your dizziness or light-headedness gets worse.  You  feel nauseous or vomit.  You have problems talking, walking, or using your arms, hands, or legs.  You feel weak.  You are not thinking clearly or you have trouble forming sentences. It may take a friend or family member to notice this.  You have chest pain, abdominal pain, shortness of breath, or sweating.  Your vision changes.  You  notice any bleeding.  You have side effects from medicine that seems to be getting worse rather than better. MAKE SURE YOU:   Understand these instructions.  Will watch your condition.  Will get help right away if you are not doing well or get worse. Document Released: 03/24/2001 Document Revised: 10/03/2013 Document Reviewed: 04/17/2011 [facility] Patient Information 2015 [place], [place]. This information is not intended to replace advice given to you by your health care provider. Make sure you discuss any questions you have with your health care provider.

## 2015-02-18 NOTE — ED Provider Notes (Signed)
CSN: [id number]     Arrival date & time 02/18/15  1414 History   First MD Initiated Contact with Patient 02/18/15 1502     Chief Complaint  Patient presents with  . Dizziness     (Consider location/radiation/quality/duration/timing/severity/associated sxs/prior Treatment) HPI  Pt presenting with c/o dizziness and spinning sensation that began yesterday.  She states that she has nausea associated and had one episode of emesis yesterday.  She feels better with lying flat, symptoms worsen with sitting up or moving.  She had a similar episode one week ago that resolved in one day.  No headache.  No change in vision or speech, no weakness of arms or legs.  Does not take blood thinners.  No head trauma.  No fever/chills.  No chest pain or difficulty breathing.  No syncope.  There are no other associated systemic symptoms, there are no other alleviating or modifying factors.   Past Medical History  Diagnosis Date  . Depression   . Hypertension   . Hyperlipidemia   . Rheumatoid arthritis(714.0)   . Osteomyelitis of left knee region   . Gait disorder   . Normal pressure hydrocephalus   . Cervical spondylosis   . Dyslipidemia   . Abnormality of gait 04/17/2013  . Occipital neuralgia 04/17/2013    right  . Cervicogenic headache     Right occipital neuralgia   Past Surgical History  Procedure Laterality Date  . Joint replacement    . Cervical spine surgery    . Ovary surgery    . Lumbar spine surgery    . Replacement total knee Left   . Ventriculo-peritoneal shunt placement / laparoscopic insertion peritoneal catheter     Family History  Problem Relation Age of Onset  . Ovarian cancer Mother   . Heart disease Father   . Brain cancer Sister    History  Substance Use Topics  . Smoking status: Former Research scientist (life sciences)  . Smokeless tobacco: Never Used  . Alcohol Use: No   OB History    No data available     Review of Systems  ROS reviewed and all otherwise negative except for mentioned in  HPI    Allergies  Methotrexate derivatives; Penicillins; and Sulfa antibiotics  Home Medications   Prior to Admission medications   Medication Sig Start Date End Date Taking? Authorizing Provider  acetaminophen (TYLENOL) 500 MG tablet Take 500 mg by mouth 3 (three) times daily as needed for mild pain.    Yes Historical Provider, MD  amLODipine (NORVASC) 5 MG tablet Take 5 mg by mouth daily.     Yes Historical Provider, MD  Bee Pollen 550 MG CAPS Take 2 capsules by mouth daily.   Yes Historical Provider, MD  Biotin (BIOTIN 5000) 5 MG CAPS Take 5,000 capsules by mouth daily.   Yes Historical Provider, MD  Calcium-Vitamin D-Vitamin K (CALCIUM SOFT CHEWS PO) Take by mouth daily.   Yes Historical Provider, MD  labetalol (NORMODYNE) 100 MG tablet Take 50 mg by mouth twice daily    Yes Historical Provider, MD  leflunomide (ARAVA) 10 MG tablet Take 10 mg by mouth daily.   Yes Historical Provider, MD  methocarbamol (ROBAXIN) 500 MG tablet Take 1 tablet (500 mg total) by mouth 3 (three) times daily as needed. Patient taking differently: Take 500 mg by mouth 3 (three) times daily as needed for muscle spasms.  11/20/14  Yes Kathrynn Ducking, MD  Multiple Vitamin (MULTIVITAMIN) tablet Take 1 tablet by mouth daily.  Yes Historical Provider, MD  traZODone (DESYREL) 50 MG tablet Take 50 mg by mouth at bedtime.   Yes Historical Provider, MD  ciprofloxacin (CIPRO) 500 MG tablet Take 1 tablet (500 mg total) by mouth 2 (two) times daily. 02/18/15   Quintella Reichert, MD  meclizine (ANTIVERT) 25 MG tablet Take 1 tablet (25 mg total) by mouth 3 (three) times daily as needed for dizziness. 02/18/15   Quintella Reichert, MD  ondansetron (ZOFRAN) 4 MG tablet Take 1 tablet (4 mg total) by mouth every 6 (six) hours. 02/18/15   Quintella Reichert, MD  traZODone (DESYREL) 100 MG tablet Take 1 tablet (100 mg total) by mouth at bedtime. Patient not taking: Reported on 02/18/2015 09/10/14   Kathrynn Ducking, MD   BP 151/76 mmHg  Pulse 67   Temp(Src) 98.3 F (36.8 C) (Oral)  Resp 16  SpO2 95%  Vitals reviewed Physical Exam  Physical Examination: General appearance - alert, well appearing, and in no distress Mental status - alert, oriented to person, place, and time Eyes - pupils equal and reactive, extraocular eye movements intact, no nystagmus Mouth - mucous membranes moist, pharynx normal without lesions Neck - supple, no significant adenopathy Chest - clear to auscultation, no wheezes, rales or rhonchi, symmetric air entry Heart - normal rate, regular rhythm, normal S1, S2, no murmurs, rubs, clicks or gallops Abdomen - soft, nontender, nondistended, no masses or organomegaly Neurological - alert, oriented x 3, cranial nerves 2-12 tested and intact, strength 5/5 in extremities x 3- chronic left lower extremity weakness, unchanged, sensation intact, FTN testing intact Extremities - peripheral pulses normal, no pedal edema, no clubbing or cyanosis Skin - normal coloration and turgor, no rashes  ED Course  Procedures (including critical care time) Labs Review Labs Reviewed  CBC WITH DIFFERENTIAL/PLATELET - Abnormal; Notable for the following:    Lymphs Abs 4.1 (*)    All other components within normal limits  BASIC METABOLIC PANEL - Abnormal; Notable for the following:    Potassium 3.4 (*)    Glucose, Bld 156 (*)    All other components within normal limits  URINALYSIS, ROUTINE W REFLEX MICROSCOPIC - Abnormal; Notable for the following:    Ketones, ur 40 (*)    Leukocytes, UA MODERATE (*)    All other components within normal limits  URINE MICROSCOPIC-ADD ON - Abnormal; Notable for the following:    Squamous Epithelial / LPF FEW (*)    Bacteria, UA FEW (*)    All other components within normal limits  URINE CULTURE  MAGNESIUM  TROPONIN I  TSH    Imaging Review Ct Head Wo Contrast  02/18/2015   CLINICAL DATA:  Dizziness with gait disorder  EXAM: CT HEAD WITHOUT CONTRAST  TECHNIQUE: Contiguous axial images  were obtained from the base of the skull through the vertex without intravenous contrast.  COMPARISON:  June 19, 2013  FINDINGS: There is stable hydrocephalus. There is a ventriculoperitoneal shunt with the tip in the right lateral ventricle near the junction with the third ventricle, stable. The sulci are not enlarged. 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 There is no other evidence of mass. There is no hemorrhage, extra-axial fluid collection, or midline shift. There is a stable tiny lacunar infarct in the mid right cerebellum. Gray-white compartments otherwise appear normal. No acute infarct apparent. The bony calvarium appears intact except for the shunt defect in the right frontal bone. Mastoid air cells are clear.  IMPRESSION: Stable hydrocephalus. Stable shunt catheter positioning. No hemorrhage, edema, or focal acute infarct. Small dermoid lateral to the right quadrigeminal plate cistern is stable.   Electronically Signed   By: Lowella Grip III M.D.   On: 02/18/2015 15:41   Mr Brain Wo Contrast  02/18/2015   CLINICAL DATA:  Dizziness with headache and nausea since yesterday. Patient reports same incident 1 week ago that resolved.  EXAM: MRI HEAD WITHOUT CONTRAST  TECHNIQUE: Multiplanar, multiecho pulse sequences of the brain and surrounding structures were obtained without intravenous contrast.  COMPARISON:  CT head 02/18/2015. CT head 06/19/2013. MR head 07/08/2010 (preoperative prior to shunt placement).  FINDINGS: No restricted diffusion, visible acute hemorrhage, intra-axial mass lesion, or extra-axial fluid.  Marked ventriculomegaly out of proportion to the degree of cortical atrophy consistent with the diagnosis of normal pressure hydrocephalus. Shunt tubing lies well placed within the RIGHT lateral ventricle. Ballooned third and fourth ventricles. Biventricular diameter of 70 mm, not appreciably improved compared with the preoperative scan in  2011. On FLAIR imaging, predominant areas of hyperintensity are periventricular in location, suggesting transependymal absorption. Mild gliosis surrounds the shunt tubing. Hyperdynamic flow through the aqueduct of Sylvius seen best on sagittal T1 imaging and axial T2 imaging suggests abnormal CSF flow dynamics.  Flow voids are maintained in the major intracranial vessels. Susceptibility related to the shunt valve, but no new areas of chronic parenchymal hemorrhage other than that previously noted along the RIGHT tentorium medial posterior temporal lobe.  No tonsillar herniation. Mild to moderate pannus surrounding the odontoid results in mild cervicomedullary stenosis. Mild cervical spondylosis at C2-3 and C3-4 is noted, with reversal of the normal cervical lordotic curve and prior fusion changes in the mid cervical spine.  Negative orbits.  No sinus or mastoid disease.  IMPRESSION: Marked persistent ventriculomegaly consistent with an incomplete response to CSF diversion for normal pressure hydrocephalus.  No evidence for acute stroke or  hemorrhage.  Moderate pannus and cervical spondylosis.   Electronically Signed   By: Rolla Flatten M.D.   On: 02/18/2015 21:06     EKG Interpretation   Date/Time:  Monday Feb 18 2015 15:18:10 EDT Ventricular Rate:  66 PR Interval:  170 QRS Duration: 78 QT Interval:  406 QTC Calculation: 425 R Axis:   16 Text Interpretation:  Normal sinus rhythm Septal infarct , age  undetermined Abnormal ECG No significant change since prior tracings on  prior dates Confirmed by [facility]  MD, [place] [phone]) on 02/18/2015 4:39:55 PM      Date: 02/18/2015  Rate: 66  Rhythm: normal sinus rhythm  QRS Axis: normal  Intervals: normal  ST/T Wave abnormalities: normal  Conduction Disutrbances: none  Narrative Interpretation: unremarkable, repeat EKG after HR normalized,  qwaves in anterior leads unchanged from prior    MDM   Final diagnoses:  Vertigo  UTI  Pt presenting with  acute onset of vertigo yesterday, with vomiting.  She has reassuring neurologic exam but has vertigo and vomiting with sitting up on stretcher.  Labs are reassuring,  Head CT shows stable ventricles, with VP shunt.     5:01 PM d/w Dr. Wyvonnia Dusky, [facility] ED- he accepts patient for transfer to .  Will need MRI brain due to vertigo.  Possibly admission if she continues to have emesis and intractable symptoms.  Pt and husband would prefer to be discharged, but will need re-assesment after MRI.   5:24 PM UA shows WBCs, bacteria- does not appear contaminated.  Pt started on IV  abx as UTI may be worsening her symptoms.  She has allergy to PCN, so given cipro x 1 dose here.  Initial EKG with tachycardia shows possible QT prolongation, however this was repeated with HR in 70s and no QT prolongation present.     Alfonzo Beers, MD 02/19/15 [phone]

## 2015-02-18 NOTE — ED Notes (Signed)
Reports dizziness, headache and nausea since yesterday. Reports same incident 1 week ago that went away after 2 days. Denies photophobia.

## 2015-02-18 NOTE — ED Notes (Signed)
Family at bedside. 

## 2015-02-18 NOTE — ED Notes (Signed)
MD at bedside. 

## 2015-02-18 NOTE — ED Provider Notes (Signed)
Pt with hx/o NPH s/p shunt here with vertigo, nausea, vomiting for two days.  Patient seen at [facility] and transferred to [facility] for MRI brain to rule out CVA.  On evaluation on the ED she feels improved and vertigo is resolved, only complains of mild nausea - improved after zofran.  MRI demonstrates no acute abnormalities.  Pt is able to ambulate in department at her baseline.  Plan to d/c home with cipro for UTI, zofran prn nausea and meclizine prn vertigo.  Discussed Neurology and PCP follow up.  Return precautions were discussed.    Quintella Reichert, MD 02/19/15 [phone]

## 2015-02-18 NOTE — ED Notes (Signed)
Given patient water and diet coke.

## 2015-02-18 NOTE — ED Notes (Signed)
[facility] is aware of the patient going to the ED.  Dr. Wyvonnia Dusky excepting patient.

## 2015-02-18 NOTE — ED Notes (Signed)
Patient still off the unit.

## 2015-02-18 NOTE — ED Notes (Signed)
Patient's family member arrived.

## 2015-02-18 NOTE — ED Notes (Signed)
Tolerated PO challenge without incident.

## 2015-02-18 NOTE — ED Notes (Signed)
Doctor at bedside.

## 2015-02-19 ENCOUNTER — Ambulatory Visit (INDEPENDENT_AMBULATORY_CARE_PROVIDER_SITE_OTHER): Payer: Medicare Other | Admitting: Neurology

## 2015-02-19 ENCOUNTER — Encounter: Payer: Self-pay | Admitting: Neurology

## 2015-02-19 ENCOUNTER — Telehealth: Payer: Self-pay | Admitting: Neurology

## 2015-02-19 VITALS — BP 130/66 | HR 65 | Ht 67.0 in | Wt 146.8 lb

## 2015-02-19 DIAGNOSIS — G441 Vascular headache, not elsewhere classified: Secondary | ICD-10-CM

## 2015-02-19 DIAGNOSIS — R42 Dizziness and giddiness: Secondary | ICD-10-CM

## 2015-02-19 DIAGNOSIS — G912 (Idiopathic) normal pressure hydrocephalus: Secondary | ICD-10-CM | POA: Diagnosis not present

## 2015-02-19 DIAGNOSIS — R269 Unspecified abnormalities of gait and mobility: Secondary | ICD-10-CM

## 2015-02-19 HISTORY — DX: Dizziness and giddiness: R42

## 2015-02-19 LAB — URINE CULTURE

## 2015-02-19 NOTE — Telephone Encounter (Signed)
Patient is calling to let Dr. Jannifer Franklin know that she was in the emergency room and release about 12:00pm last night. It was determined she had vertigo and a bladder infection. They did a MRI and a KAT scan while she was there. Please call patient as she feels she might need to come on in for an appt.  Thanks!

## 2015-02-19 NOTE — Progress Notes (Signed)
Reason for visit: Vertigo  [name] is an 74 y.o. 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 The patient has had an event of transient vertigo on 02/11/2015 that lasted only a few hours. The patient however had recurrence of this on 02/17/2015 lasting more than a day or 2. The patient had nausea and vomiting with this. There was no definite positional component to the vertigo and no other associated symptoms such as numbness or weakness on the face, arms, or legs. The patient does have a daily mild headache, this did not change with the vertigo. There were no changes in hearing or ringing in the ears. The patient was placed on meclizine which seemed to help some. The patient went to the emergency room on 02/18/2015, and a CT scan of the brain was done followed by MRI evaluation of the brain. There is no change from this study is compared to one done previously on 06/19/2013. The patient does have ventriculomegaly, with a VP shunt placed in the right frontal area. The patient denies any blackout episodes, slurred speech, or changes in swallowing. The patient has had some change in her ability to walk with the vertigo. The vertigo was much better today, but the patient is still not quite back to baseline. She returns for an evaluation.  Past Medical History  Diagnosis Date  . Depression   . Hypertension   . Hyperlipidemia   . Rheumatoid arthritis(714.0)   . Osteomyelitis of left knee region   . Gait disorder   . Normal pressure hydrocephalus   . Cervical spondylosis   . Dyslipidemia   . Abnormality of gait 04/17/2013  . Occipital neuralgia 04/17/2013    right  . Cervicogenic headache     Right occipital neuralgia  . Vertigo 02/19/2015    Past Surgical History  Procedure Laterality Date  .  Joint replacement    . Cervical spine surgery    . Ovary surgery    . Lumbar spine surgery    . Replacement total knee Left   . Ventriculo-peritoneal shunt placement / laparoscopic insertion peritoneal catheter      Family History  Problem Relation Age of Onset  . Ovarian cancer Mother   . Heart disease Father   . Brain cancer Sister     Social history:  reports that she has quit smoking. She has never used smokeless tobacco. She reports that she does not drink alcohol or use illicit drugs.    Allergies  Allergen Reactions  . Methotrexate Derivatives Other (See Comments)    Dangerously decreased platelet count  . Penicillins Itching  . Sulfa Antibiotics Itching    Medications:  Prior to Admission medications   Medication Sig Start Date End Date Taking? Authorizing Provider  acetaminophen (TYLENOL) 500 MG tablet Take 500 mg by mouth 3 (three) times daily as needed for mild pain.    Yes Historical Provider, MD  amLODipine (NORVASC) 5 MG tablet Take 5 mg by mouth daily.     Yes Historical Provider, MD  Bee Pollen 550 MG CAPS Take 2 capsules by mouth daily.   Yes Historical Provider, MD  Biotin (BIOTIN 5000) 5 MG CAPS Take 5,000 capsules by mouth daily.   Yes Historical Provider, MD  Calcium-Vitamin D-Vitamin K (CALCIUM SOFT CHEWS PO) Take by mouth daily.   Yes Historical Provider,  MD  ciprofloxacin (CIPRO) 500 MG tablet Take 1 tablet (500 mg total) by mouth 2 (two) times daily. 02/18/15  Yes Quintella Reichert, MD  labetalol (NORMODYNE) 100 MG tablet Take 50 mg by mouth twice daily    Yes Historical Provider, MD  leflunomide (ARAVA) 10 MG tablet Take 10 mg by mouth daily.   Yes Historical Provider, MD  meclizine (ANTIVERT) 25 MG tablet Take 1 tablet (25 mg total) by mouth 3 (three) times daily as needed for dizziness. 02/18/15  Yes Quintella Reichert, MD  methocarbamol (ROBAXIN) 500 MG tablet Take 1 tablet (500 mg total) by mouth 3 (three) times daily as needed. Patient taking differently:  Take 500 mg by mouth 3 (three) times daily as needed for muscle spasms.  11/20/14  Yes Kathrynn Ducking, MD  Multiple Vitamin (MULTIVITAMIN) tablet Take 1 tablet by mouth daily.   Yes Historical Provider, MD  ondansetron (ZOFRAN) 4 MG tablet Take 1 tablet (4 mg total) by mouth every 6 (six) hours. 02/18/15  Yes Quintella Reichert, MD  traZODone (DESYREL) 100 MG tablet Take 1 tablet (100 mg total) by mouth at bedtime. 09/10/14  Yes Kathrynn Ducking, MD  traZODone (DESYREL) 50 MG tablet Take 50 mg by mouth at bedtime.   Yes Historical Provider, MD    ROS:  Out of a complete 14 system review of symptoms, the patient complains only of the following symptoms, and all other reviewed systems are negative.  Achy muscles, walking difficulty Headache, weakness  Blood pressure 130/66, pulse 65, height 5\' 7"  (1.702 m), weight 146 lb 12.8 oz (66.588 kg).  Physical Exam  General: The patient is alert and cooperative at the time of the examination.  Ears: Tympanic membranes are clear bilaterally.  Skin: No significant peripheral edema is noted.   Neurologic Exam  Mental status: The patient is alert and oriented x 3 at the time of the examination. The patient has apparent normal recent and remote memory, with an apparently normal attention span and concentration ability.   Cranial nerves: Facial symmetry is present. Speech is normal, no aphasia or dysarthria is noted. Extraocular movements are full. Visual fields are full.  Motor: The patient has good strength in all 4 extremities, with exception of 4 minus/5 strength with hip flexion bilaterally..  Sensory examination: Soft touch sensation is symmetric on the face, arms, and legs.  Coordination: The patient has good finger-nose-finger and heel-to-shin bilaterally.  Gait and station: The patient has a wide-based, unsteady gait. The patient is able to walk with a walker. She has positive Romberg. Tandem gait was not attempted.  Reflexes: Deep tendon  reflexes are symmetric.   MRI brain 02/18/15:  IMPRESSION: Marked persistent ventriculomegaly consistent with an incomplete response to CSF diversion for normal pressure hydrocephalus.  No evidence for acute stroke or hemorrhage.  Moderate pannus and cervical spondylosis.  * MRI scan images were reviewed online. I agree with the written report.    Assessment/Plan:  1. Chronic gait disorder  2. Normal pressure hydrocephalus  3. Lumbosacral spondylosis  4. Vertigo  The patient likely has an inner ear etiology of her vertigo which seems to be improving currently. MRI of the brain was done and did not show evidence of a stroke event. The patient has gained some benefit with meclizine. The she will continue this medication for a few more days until the vertigo is resolved. The patient will follow-up on 03/20/2015. She will contact our office if she is not doing well.  Bonner Puna  Jannifer Franklin MD 02/19/2015 5:56 PM  [facility] [address] [place], [place] [address]  Phone [phone] Fax [phone]

## 2015-02-19 NOTE — Telephone Encounter (Signed)
I called the patient. She has not been to our office since Dec, 2015. She would like to come in to see Dr. Jannifer Franklin since she had a new diagnosis of vertigo. Appointment scheduled for 3:00 today (5/10).

## 2015-02-19 NOTE — Patient Instructions (Signed)

## 2015-02-19 NOTE — ED Notes (Signed)
EDP at bedside  

## 2015-03-20 ENCOUNTER — Encounter: Payer: Self-pay | Admitting: Neurology

## 2015-03-20 ENCOUNTER — Ambulatory Visit (INDEPENDENT_AMBULATORY_CARE_PROVIDER_SITE_OTHER): Payer: Medicare Other | Admitting: Neurology

## 2015-03-20 VITALS — BP 149/73 | HR 67 | Ht 67.0 in | Wt 150.0 lb

## 2015-03-20 DIAGNOSIS — R269 Unspecified abnormalities of gait and mobility: Secondary | ICD-10-CM | POA: Diagnosis not present

## 2015-03-20 DIAGNOSIS — M47812 Spondylosis without myelopathy or radiculopathy, cervical region: Secondary | ICD-10-CM | POA: Diagnosis not present

## 2015-03-20 DIAGNOSIS — R42 Dizziness and giddiness: Secondary | ICD-10-CM | POA: Diagnosis not present

## 2015-03-20 NOTE — Progress Notes (Signed)
Reason for visit: Vertigo  [name] is an 74 y.o. female  History of present illness:  [name] is a 73 year old right-handed white female with a history of hydrocephalus, status post VP shunt placement. The patient has cervical spondylosis, prior surgery and prior lumbosacral spine surgery. The patient has some proximal weakness in the left leg that is chronic in nature, associated with a gait disorder. The patient uses a walker for ambulation. She has had significant vertigo that has occurred over the last several months. She continues to have daily mild episodes of vertigo when she first gets out of bed. Usually the vertigo is short in duration, but occasionally, the vertigo may last several hours to all day long, associated with nausea and vomiting. The patient has no other associated symptoms of headache, double vision, loss of vision, ringing in the ears, hearing loss, or numbness or weakness on the extremities that is new. The patient has had MRI evaluation of the brain that does not show any change from prior studies. She returns to this office for an evaluation. She has been given a handout of the Epley maneuvers, she has not initiated this treatment. The patient does get some benefit from the use of meclizine.  Past Medical History  Diagnosis Date  . Depression   . Hypertension   . Hyperlipidemia   . Rheumatoid arthritis(714.0)   . Osteomyelitis of left knee region   . Gait disorder   . Normal pressure hydrocephalus   . Cervical spondylosis   . Dyslipidemia   . Abnormality of gait 04/17/2013  . Occipital neuralgia 04/17/2013    right  . Cervicogenic headache     Right occipital neuralgia  . Vertigo 02/19/2015    Past Surgical History  Procedure Laterality Date  . Joint replacement    . Cervical spine surgery    . Ovary surgery    . Lumbar spine surgery    . Replacement total knee Left   . Ventriculo-peritoneal shunt placement / laparoscopic insertion peritoneal  catheter      Family History  Problem Relation Age of Onset  . Ovarian cancer Mother   . Heart disease Father   . Brain cancer Sister     Social history:  reports that she has quit smoking. She has never used smokeless tobacco. She reports that she does not drink alcohol or use illicit drugs.    Allergies  Allergen Reactions  . Methotrexate Derivatives Other (See Comments)    Dangerously decreased platelet count  . Penicillins Itching  . Sulfa Antibiotics Itching    Medications:  Prior to Admission medications   Medication Sig Start Date End Date Taking? Authorizing Provider  acetaminophen (TYLENOL) 500 MG tablet Take 500 mg by mouth 3 (three) times daily as needed for mild pain.    Yes Historical Provider, MD  amLODipine (NORVASC) 5 MG tablet Take 5 mg by mouth daily.     Yes Historical Provider, MD  Bee Pollen 550 MG CAPS Take 2 capsules by mouth daily.   Yes Historical Provider, MD  Biotin (BIOTIN 5000) 5 MG CAPS Take 5,000 capsules by mouth daily.   Yes Historical Provider, MD  Calcium-Vitamin D-Vitamin K (CALCIUM SOFT CHEWS PO) Take by mouth daily.   Yes Historical Provider, MD  labetalol (NORMODYNE) 100 MG tablet Take 50 mg by mouth twice daily    Yes Historical Provider, MD  leflunomide (ARAVA) 10 MG tablet Take 10 mg by mouth daily.   Yes Historical Provider, MD  meclizine (ANTIVERT) 25 MG tablet Take 1 tablet (25 mg total) by mouth 3 (three) times daily as needed for dizziness. 02/18/15  Yes Quintella Reichert, MD  methocarbamol (ROBAXIN) 500 MG tablet Take 1 tablet (500 mg total) by mouth 3 (three) times daily as needed. Patient taking differently: Take 500 mg by mouth 3 (three) times daily as needed for muscle spasms.  11/20/14  Yes Kathrynn Ducking, MD  Multiple Vitamin (MULTIVITAMIN) tablet Take 1 tablet by mouth daily.   Yes Historical Provider, MD  ondansetron (ZOFRAN) 4 MG tablet Take 1 tablet (4 mg total) by mouth every 6 (six) hours. 02/18/15  Yes Quintella Reichert, MD    traZODone (DESYREL) 50 MG tablet Take 50 mg by mouth at bedtime.   Yes Historical Provider, MD    ROS:  Out of a complete 14 system review of symptoms, the patient complains only of the following symptoms, and all other reviewed systems are negative.  Vertigo Gait disorder  Blood pressure 149/73, pulse 67, height 5\' 7"  (1.702 m), weight 150 lb (68.04 kg).  Physical Exam  General: The patient is alert and cooperative at the time of the examination.  Skin: No significant peripheral edema is noted.   Neurologic Exam  Mental status: The patient is alert and oriented x 3 at the time of the examination. The patient has apparent normal recent and remote memory, with an apparently normal attention span and concentration ability.   Cranial nerves: Facial symmetry is present. Speech is normal, no aphasia or dysarthria is noted. Extraocular movements are full. Visual fields are full.  Motor: The patient has good strength in all 4 extremities, with exception of 3/5 strength proximally in the left leg, 4/5 strength in the right leg proximally.  Sensory examination: Soft touch sensation is symmetric on the face, arms, and legs.  Coordination: The patient has good finger-nose-finger and heel-to-shin bilaterally.  Gait and station: The patient has a wide-based, unsteady gait. The patient uses a walker for ambulation. Can gait was not attempted. Romberg is negative. No drift is seen.  Reflexes: Deep tendon reflexes are symmetric.   Assessment/Plan:  1. Vertigo  2. Gait disturbance  3. Cervical spondylosis  4. Lumbar spondylosis  5. Hydrocephalus, status post VP shunt placement  The patient is having ongoing episodes of vertigo that appear to be initiated with getting out of bed in the morning. The patient was given a prescription for vestibular rehabilitation, but she may try the Epley maneuvers on her own at home. The patient will follow-up through this office in about 6 months, or  sooner if needed. I believe that the vertigo is related to vestibular dysfunction.  Jill Alexanders MD 03/20/2015 8:19 PM  [facility] [address] [place], [place] [address]  Phone [phone] Fax [phone]

## 2015-03-20 NOTE — Patient Instructions (Signed)
   May do Epley maneuvers on her own at home. If you desire, we will refer to physical therapy for vestibular rehabilitation.  Vertigo Vertigo means you feel like you or your surroundings are moving when they are not. Vertigo can be dangerous if it occurs when you are at work, driving, or performing difficult activities.  CAUSES  Vertigo occurs when there is a conflict of signals sent to your brain from the visual and sensory systems in your body. There are many different causes of vertigo, including:  Infections, especially in the inner ear.  A bad reaction to a drug or misuse of alcohol and medicines.  Withdrawal from drugs or alcohol.  Rapidly changing positions, such as lying down or rolling over in bed.  A migraine headache.  Decreased blood flow to the brain.  Increased pressure in the brain from a head injury, infection, tumor, or bleeding. SYMPTOMS  You may feel as though the world is spinning around or you are falling to the ground. Because your balance is upset, vertigo can cause nausea and vomiting. You may have involuntary eye movements (nystagmus). DIAGNOSIS  Vertigo is usually diagnosed by physical exam. If the cause of your vertigo is unknown, your caregiver may perform imaging tests, such as an MRI scan (magnetic resonance imaging). TREATMENT  Most cases of vertigo resolve on their own, without treatment. Depending on the cause, your caregiver may prescribe certain medicines. If your vertigo is related to body position issues, your caregiver may recommend movements or procedures to correct the problem. In rare cases, if your vertigo is caused by certain inner ear problems, you may need surgery. HOME CARE INSTRUCTIONS   Follow your caregiver's instructions.  Avoid driving.  Avoid operating heavy machinery.  Avoid performing any tasks that would be dangerous to you or others during a vertigo episode.  Tell your caregiver if you notice that certain medicines seem to  be causing your vertigo. Some of the medicines used to treat vertigo episodes can actually make them worse in some people. SEEK IMMEDIATE MEDICAL CARE IF:   Your medicines do not relieve your vertigo or are making it worse.  You develop problems with talking, walking, weakness, or using your arms, hands, or legs.  You develop severe headaches.  Your nausea or vomiting continues or gets worse.  You develop visual changes.  A family member notices behavioral changes.  Your condition gets worse. MAKE SURE YOU:  Understand these instructions.  Will watch your condition.  Will get help right away if you are not doing well or get worse. Document Released: 07/08/2005 Document Revised: 12/21/2011 Document Reviewed: 04/16/2011 [facility] Patient Information 2015 [place], [place]. This information is not intended to replace advice given to you by your health care provider. Make sure you discuss any questions you have with your health care provider.

## 2015-04-21 ENCOUNTER — Other Ambulatory Visit: Payer: Self-pay | Admitting: Neurology

## 2015-09-19 ENCOUNTER — Encounter: Payer: Self-pay | Admitting: Neurology

## 2015-09-19 ENCOUNTER — Ambulatory Visit (INDEPENDENT_AMBULATORY_CARE_PROVIDER_SITE_OTHER): Payer: Medicare Other | Admitting: Neurology

## 2015-09-19 VITALS — BP 158/67 | HR 63 | Ht 67.0 in | Wt 149.0 lb

## 2015-09-19 DIAGNOSIS — G912 (Idiopathic) normal pressure hydrocephalus: Secondary | ICD-10-CM

## 2015-09-19 DIAGNOSIS — M47812 Spondylosis without myelopathy or radiculopathy, cervical region: Secondary | ICD-10-CM | POA: Diagnosis not present

## 2015-09-19 DIAGNOSIS — R269 Unspecified abnormalities of gait and mobility: Secondary | ICD-10-CM | POA: Diagnosis not present

## 2015-09-19 DIAGNOSIS — R42 Dizziness and giddiness: Secondary | ICD-10-CM

## 2015-09-19 NOTE — Progress Notes (Signed)
Reason for visit: Gait disorder  [name] is an 74 y.o. female  History of present illness:  [name] is a 74 year old right-handed white female with a complex medical history that includes a cervical myelopathy, prior lumbar spine surgery, hydrocephalus, status post VP shunt, and a chronic gait disorder. The patient has bilateral proximal leg weakness, much more prominent on the left than the right. She reports some fatigue issues with walking, she believes that this has worsened some over time. When last seen one year ago, she indicated that she could only walk about 25 yards without having to stop to rest. MRI of the lumbar spine done at that time did not show evidence of spinal stenosis. The patient uses a walker for ambulation, she has not had any falls while using a walker. She has a history of rheumatoid arthritis. The patient at this point indicates that she is exercising 3 times a week, she has not noted any decline in her ability to perform exercises, and she indicates that she can walk about one block before she has to rest. Otherwise, no new symptoms been noted. She has had some issues with vertigo that may occur when getting up out of bed in the morning. She seems to have controlled this with the use of Epley maneuvers, and medication such as Antivert and Zofran.  Past Medical History  Diagnosis Date  . Depression   . Hypertension   . Hyperlipidemia   . Rheumatoid arthritis(714.0)   . Osteomyelitis of left knee region ([place])   . Gait disorder   . Normal pressure hydrocephalus   . Cervical spondylosis   . Dyslipidemia   . Abnormality of gait 04/17/2013  . Occipital neuralgia 04/17/2013    right  . Cervicogenic headache     Right occipital neuralgia  . Vertigo 02/19/2015    Past Surgical History  Procedure Laterality Date  . Joint replacement    . Cervical spine surgery    . Ovary surgery    . Lumbar spine surgery    . Replacement total knee Left   .  Ventriculo-peritoneal shunt placement / laparoscopic insertion peritoneal catheter      Family History  Problem Relation Age of Onset  . Ovarian cancer Mother   . Heart disease Father   . Brain cancer Sister     Social history:  reports that she has quit smoking. She has never used smokeless tobacco. She reports that she does not drink alcohol or use illicit drugs.    Allergies  Allergen Reactions  . Methotrexate Derivatives Other (See Comments)    Dangerously decreased platelet count  . Penicillins Itching  . Sulfa Antibiotics Itching    Medications:  Prior to Admission medications   Medication Sig Start Date End Date Taking? Authorizing Provider  acetaminophen (TYLENOL) 500 MG tablet Take 500 mg by mouth 3 (three) times daily as needed for mild pain.    Yes Historical Provider, MD  amLODipine (NORVASC) 5 MG tablet Take 5 mg by mouth daily.     Yes Historical Provider, MD  Bee Pollen 550 MG CAPS Take 2 capsules by mouth daily.   Yes Historical Provider, MD  Biotin (BIOTIN 5000) 5 MG CAPS Take 5,000 capsules by mouth daily.   Yes Historical Provider, MD  Calcium-Vitamin D-Vitamin K (CALCIUM SOFT CHEWS PO) Take by mouth daily.   Yes Historical Provider, MD  labetalol (NORMODYNE) 100 MG tablet Take 50 mg by mouth twice daily    Yes Historical  Provider, MD  leflunomide (ARAVA) 10 MG tablet Take 10 mg by mouth daily.   Yes Historical Provider, MD  meclizine (ANTIVERT) 25 MG tablet Take 1 tablet (25 mg total) by mouth 3 (three) times daily as needed for dizziness. 02/18/15  Yes Quintella Reichert, MD  methocarbamol (ROBAXIN) 500 MG tablet TAKE 1 TABLET BY MOUTH THREE TIMES DAILY AS NEEDED 04/21/15  Yes Kathrynn Ducking, MD  Multiple Vitamin (MULTIVITAMIN) tablet Take 1 tablet by mouth daily.   Yes Historical Provider, MD  ondansetron (ZOFRAN) 4 MG tablet Take 1 tablet (4 mg total) by mouth every 6 (six) hours. 02/18/15  Yes Quintella Reichert, MD  traZODone (DESYREL) 50 MG tablet Take 50 mg by mouth  at bedtime.   Yes Historical Provider, MD    ROS:  Out of a complete 14 system review of symptoms, the patient complains only of the following symptoms, and all other reviewed systems are negative.  Decreased activity Insomnia Urgency and incontinence of bladder Walking difficulty, neck pain Weakness  Blood pressure 158/67, pulse 63, height 5\' 7"  (1.702 m), weight 149 lb (67.586 kg).  Physical Exam  General: The patient is alert and cooperative at the time of the examination.  Skin: No significant peripheral edema is noted.   Neurologic Exam  Mental status: The patient is alert and oriented x 3 at the time of the examination. The patient has apparent normal recent and remote memory, with an apparently normal attention span and concentration ability.   Cranial nerves: Facial symmetry is present. Speech is normal, no aphasia or dysarthria is noted. Extraocular movements are full. Visual fields are full.  Motor: The patient has good strength in all 4 extremities, with exception of 3/5 strength with left hip flexion, 4/5 strength on the right.  Sensory examination: Soft touch sensation is symmetric on the face, arms, and legs.  Coordination: The patient has good finger-nose-finger, but the patient has difficulty performing heel-to-shin with the left leg, able to perform with the right bilaterally.  Gait and station: The patient is able to walk with a walker, she has good stability with this. The patient has good turns. Can gait was not attempted. Romberg is negative, but is unsteady.  Reflexes: Deep tendon reflexes are symmetric, but are depressed.   Assessment/Plan:  1. Hydrocephalus, VP shunt  2. Cervical spondylosis, myelopathy  3. Lumbosacral spondylosis, status post surgery  4. Chronic gait disorder, bilateral leg weakness  5. Episodic vertigo  Clinically, the neurologic examination for this patient has not changed. She still has bilateral proximal leg weakness,  left greater than right. She reports some fatigue issues, but the ability to walk long distances seems to have improved from the reports from last year. She is exercising on a regular basis, I have encouraged her to continue this. The vertigo issue has been better controlled with the use of medications. She will follow-up in 6 months, sooner if needed.  Jill Alexanders MD 09/19/2015 7:21 PM  [facility] [address] [place], [place] [address]  Phone [phone] Fax [phone]

## 2015-09-19 NOTE — Patient Instructions (Signed)
Fall Prevention in the Home  Falls can cause injuries and can affect people from all age groups. There are many simple things that you can do to make your home safe and to help prevent falls. WHAT CAN I DO ON THE OUTSIDE OF MY HOME?  Regularly repair the edges of walkways and driveways and fix any cracks.  Remove high doorway thresholds.  Trim any shrubbery on the main path into your home.  Use bright outdoor lighting.  Clear walkways of debris and clutter, including tools and rocks.  Regularly check that handrails are securely fastened and in good repair. Both sides of any steps should have handrails.  Install guardrails along the edges of any raised decks or porches.  Have leaves, snow, and ice cleared regularly.  Use sand or salt on walkways during winter months.  In the garage, clean up any spills right away, including grease or oil spills. WHAT CAN I DO IN THE BATHROOM?  Use night lights.  Install grab bars by the toilet and in the tub and shower. Do not use towel bars as grab bars.  Use non-skid mats or decals on the floor of the tub or shower.  If you need to sit down while you are in the shower, use a plastic, non-slip stool..  Keep the floor dry. Immediately clean up any water that spills on the floor.  Remove soap buildup in the tub or shower on a regular basis.  Attach bath mats securely with double-sided non-slip rug tape.  Remove throw rugs and other tripping hazards from the floor. WHAT CAN I DO IN THE BEDROOM?  Use night lights.  Make sure that a bedside light is easy to reach.  Do not use oversized bedding that drapes onto the floor.  Have a firm chair that has side arms to use for getting dressed.  Remove throw rugs and other tripping hazards from the floor. WHAT CAN I DO IN THE KITCHEN?   Clean up any spills right away.  Avoid walking on wet floors.  Place frequently used items in easy-to-reach places.  If you need to reach for something  above you, use a sturdy step stool that has a grab bar.  Keep electrical cables out of the way.  Do not use floor polish or wax that makes floors slippery. If you have to use wax, make sure that it is non-skid floor wax.  Remove throw rugs and other tripping hazards from the floor. WHAT CAN I DO IN THE STAIRWAYS?  Do not leave any items on the stairs.  Make sure that there are handrails on both sides of the stairs. Fix handrails that are broken or loose. Make sure that handrails are as long as the stairways.  Check any carpeting to make sure that it is firmly attached to the stairs. Fix any carpet that is loose or worn.  Avoid having throw rugs at the top or bottom of stairways, or secure the rugs with carpet tape to prevent them from moving.  Make sure that you have a light switch at the top of the stairs and the bottom of the stairs. If you do not have them, have them installed. WHAT ARE SOME OTHER FALL PREVENTION TIPS?  Wear closed-toe shoes that fit well and support your feet. Wear shoes that have rubber soles or low heels.  When you use a stepladder, make sure that it is completely opened and that the sides are firmly locked. Have someone hold the ladder while you   are using it. Do not climb a closed stepladder.  Add color or contrast paint or tape to grab bars and handrails in your home. Place contrasting color strips on the first and last steps.  Use mobility aids as needed, such as canes, walkers, scooters, and crutches.  Turn on lights if it is dark. Replace any light bulbs that burn out.  Set up furniture so that there are clear paths. Keep the furniture in the same spot.  Fix any uneven floor surfaces.  Choose a carpet design that does not hide the edge of steps of a stairway.  Be aware of any and all pets.  Review your medicines with your healthcare provider. Some medicines can cause dizziness or changes in blood pressure, which increase your risk of falling. Talk  with your health care provider about other ways that you can decrease your risk of falls. This may include working with a physical therapist or trainer to improve your strength, balance, and endurance.   This information is not intended to replace advice given to you by your health care provider. Make sure you discuss any questions you have with your health care provider.   Document Released: 09/18/2002 Document Revised: 02/12/2015 Document Reviewed: 11/02/2014 Elsevier Interactive Patient Education 2016 [employer]  

## 2015-11-25 ENCOUNTER — Telehealth: Payer: Self-pay | Admitting: Neurology

## 2015-11-25 NOTE — Telephone Encounter (Signed)
Pt called and states that her insurance will no longer cover methocarbamol (ROBAXIN) 500 MG tablet, she is requesting a prior auth for it. Please call and advise [phone]

## 2015-11-26 NOTE — Telephone Encounter (Signed)
I called [employer] and spoke to [place]. I advised the patient does have acute musculoskeletal pain.

## 2015-11-26 NOTE — Telephone Encounter (Signed)
[place]/[employer] [phone] called regarding pending PA for methocarbamol (ROBAXIN) 500 MG tablet, needs to know if patient has acute musculoskeletal pain?

## 2015-11-26 NOTE — Telephone Encounter (Signed)
I called [facility] in [place] and spoke to Carmi. He confirmed the patient does need a PA for methocarbamol. Insurance [employer]. Member ID: [id number]. PA submitted and pending approval. I called the patient to let her know but she did not answer. I LVM asking her to call back. Please inform her of this when she calls.

## 2015-11-29 ENCOUNTER — Telehealth: Payer: Self-pay

## 2015-11-29 NOTE — Telephone Encounter (Signed)
[employer] approved PA 11/26/15-11/25/16. Member ID: [id number]. Phone number [phone].

## 2016-03-05 ENCOUNTER — Other Ambulatory Visit: Payer: Self-pay

## 2016-03-05 MED ORDER — TRAZODONE HCL 50 MG PO TABS: 50.0000 mg | ORAL_TABLET | Freq: Every day | ORAL | Status: DC

## 2016-03-05 NOTE — Telephone Encounter (Signed)
Received faxed request for refills. Sent in to mail order pharmacy x 1 yr.

## 2016-03-13 ENCOUNTER — Telehealth: Payer: Self-pay

## 2016-03-13 NOTE — Telephone Encounter (Signed)
Called pt and rescheduled appt from 6/15 to 03/20/16 as Dr. Jannifer Franklin will be out of the office.

## 2016-03-20 ENCOUNTER — Encounter: Payer: Self-pay | Admitting: Neurology

## 2016-03-20 ENCOUNTER — Ambulatory Visit (INDEPENDENT_AMBULATORY_CARE_PROVIDER_SITE_OTHER): Payer: Medicare Other | Admitting: Neurology

## 2016-03-20 VITALS — BP 167/89 | HR 68 | Ht 67.0 in | Wt 151.2 lb

## 2016-03-20 DIAGNOSIS — G441 Vascular headache, not elsewhere classified: Secondary | ICD-10-CM | POA: Diagnosis not present

## 2016-03-20 DIAGNOSIS — R269 Unspecified abnormalities of gait and mobility: Secondary | ICD-10-CM | POA: Diagnosis not present

## 2016-03-20 DIAGNOSIS — M5481 Occipital neuralgia: Secondary | ICD-10-CM | POA: Diagnosis not present

## 2016-03-20 DIAGNOSIS — M47812 Spondylosis without myelopathy or radiculopathy, cervical region: Secondary | ICD-10-CM | POA: Diagnosis not present

## 2016-03-20 MED ORDER — METHOCARBAMOL 500 MG PO TABS: 500.0000 mg | ORAL_TABLET | Freq: Three times a day (TID) | ORAL | Status: DC | PRN

## 2016-03-20 NOTE — Progress Notes (Signed)
Reason for visit: Gait disorder  [name] is an 75 y.o. female  History of present illness:  [name] is a 75 year old right-handed white female with a history of a multifactorial gait disorder. The patient has a history of a cervical myelopathy, lumbosacral spine surgery, hydrocephalus with VP shunt placement. The patient has a left peroneal neuropathy. The patient has weakness with left greater than right hip flexion. She is trying to exercise on a regular basis, she goes to the swimming pool 3 times a week, and exercises on other days. The patient is currently in physical therapy. She believes that there has been some increased fatigue with the legs. She indicates that she can walk about 50 yards without resting. She is sleeping well at night. She reports no falls since last seen. She uses a rolling walker for ambulation. The patient also has a history of rheumatoid arthritis. She takes methocarbamol for neuromuscular discomfort. This seems to help her. She returns for an evaluation.  Past Medical History  Diagnosis Date  . Depression   . Hypertension   . Hyperlipidemia   . Rheumatoid arthritis(714.0)   . 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 Gait disorder   . Normal pressure hydrocephalus   . Cervical spondylosis   . Dyslipidemia   . Abnormality of gait 04/17/2013  . Occipital neuralgia 04/17/2013    right  . Cervicogenic headache     Right occipital neuralgia  . Vertigo 02/19/2015    Past Surgical History  Procedure Laterality Date  . Joint replacement    . Cervical spine surgery    . Ovary surgery    . Lumbar spine surgery    . Replacement total knee Left   . Ventriculo-peritoneal shunt placement / laparoscopic insertion peritoneal catheter      Family History  Problem Relation Age of Onset  . Ovarian cancer Mother   . Heart disease Father   . Brain cancer Sister     Social history:  reports that she has quit smoking. She has never used smokeless  tobacco. She reports that she does not drink alcohol or use illicit drugs.    Allergies  Allergen Reactions  . Methotrexate Derivatives Other (See Comments)    Dangerously decreased platelet count  . Penicillins Itching  . Sulfa Antibiotics Itching    Medications:  Prior to Admission medications   Medication Sig Start Date End Date Taking? Authorizing Provider  acetaminophen (TYLENOL) 500 MG tablet Take 500 mg by mouth 3 (three) times daily as needed for mild pain.    Yes Historical Provider, MD  amLODipine (NORVASC) 5 MG tablet Take 5 mg by mouth daily.     Yes Historical Provider, MD  Bee Pollen 550 MG CAPS Take 1 capsule by mouth daily.    Yes Historical Provider, MD  Biotin (BIOTIN 5000) 5 MG CAPS Take 5,000 capsules by mouth daily.   Yes Historical Provider, MD  Calcium-Vitamin D-Vitamin K (CALCIUM SOFT CHEWS PO) Take by mouth daily.   Yes Historical Provider, MD  labetalol (NORMODYNE) 100 MG tablet Take 50 mg by mouth twice daily    Yes Historical Provider, MD  leflunomide (ARAVA) 10 MG tablet Take 10 mg by mouth daily.   Yes Historical Provider, MD  meclizine (ANTIVERT) 25 MG tablet Take 1 tablet (25 mg total) by mouth 3 (three) times daily as needed for dizziness. 02/18/15  Yes Quintella Reichert, MD  methocarbamol (ROBAXIN) 500 MG tablet Take 1 tablet (500 mg total)  by mouth 3 (three) times daily as needed. 03/20/16  Yes Kathrynn Ducking, MD  Multiple Vitamin (MULTIVITAMIN) tablet Take 1 tablet by mouth daily.   Yes Historical Provider, MD  ondansetron (ZOFRAN) 4 MG tablet Take 1 tablet (4 mg total) by mouth every 6 (six) hours. 02/18/15  Yes Quintella Reichert, MD  traZODone (DESYREL) 50 MG tablet Take 1 tablet (50 mg total) by mouth at bedtime. 03/05/16  Yes Kathrynn Ducking, MD    ROS:  Out of a complete 14 system review of symptoms, the patient complains only of the following symptoms, and all other reviewed systems are negative.  Frequent waking Joint pain, walking difficulty, neck  stiffness Headache  Blood pressure 167/89, pulse 68, height 5\' 7"  (1.702 m), weight 151 lb 4 oz (68.607 kg).  Physical Exam  General: The patient is alert and cooperative at the time of the examination.  Skin: No significant peripheral edema is noted.   Neurologic Exam  Mental status: The patient is alert and oriented x 3 at the time of the examination. The patient has apparent normal recent and remote memory, with an apparently normal attention span and concentration ability.   Cranial nerves: Facial symmetry is present. Speech is normal, no aphasia or dysarthria is noted. Extraocular movements are full. Visual fields are full.  Motor: The patient has good strength in all 4 extremities, with exception of 3/5 strength with hip flexion on the left, 4/5 on the right.  Sensory examination: Soft touch sensation is symmetric on the face, arms, and legs.  Coordination: The patient has good finger-nose-finger and heel-to-shin bilaterally, with exception that she cannot perform heel-to-shin on the left leg.  Gait and station: The patient has the ability to arise from a seated position with arms crossed. Once up, she is able to walk with a walker, she has good stride, some hesitation with turns. Romberg is negative. Tandem gait was not attempted.  Reflexes: Deep tendon reflexes are symmetric.   Assessment/Plan:  1. Left greater right lower extremity weakness, gait disorder  2. Rheumatoid arthritis  3. Cervical and lumbar spine disease  4. Hydrocephalus, VP shunt placement  The patient is actually doing fairly well at this time. She is ambulating well with the walker, taking good strides. She is exercising on a regular basis, she currently is in physical therapy. I have little to offer at this point, she appears to be quite stable with her underlying clinical condition. She will follow-up through this office in 8 or 9 months. A prescription for methocarbamol was called in.  Jill Alexanders MD 03/20/2016 1:40 PM  [facility] [address] [place], [place] [address]  Phone [phone] Fax [phone]

## 2016-03-23 ENCOUNTER — Ambulatory Visit: Payer: Medicare Other | Admitting: Neurology

## 2016-03-26 ENCOUNTER — Ambulatory Visit: Payer: Medicare Other | Admitting: Neurology

## 2016-12-22 ENCOUNTER — Encounter: Payer: Self-pay | Admitting: Neurology

## 2016-12-22 ENCOUNTER — Ambulatory Visit (INDEPENDENT_AMBULATORY_CARE_PROVIDER_SITE_OTHER): Payer: Medicare Other | Admitting: Neurology

## 2016-12-22 VITALS — BP 158/73 | HR 64 | Ht 67.0 in | Wt 153.0 lb

## 2016-12-22 DIAGNOSIS — G912 (Idiopathic) normal pressure hydrocephalus: Secondary | ICD-10-CM | POA: Diagnosis not present

## 2016-12-22 DIAGNOSIS — R269 Unspecified abnormalities of gait and mobility: Secondary | ICD-10-CM | POA: Diagnosis not present

## 2016-12-22 DIAGNOSIS — R42 Dizziness and giddiness: Secondary | ICD-10-CM | POA: Diagnosis not present

## 2016-12-22 MED ORDER — METHOCARBAMOL 500 MG PO TABS: 500.0000 mg | ORAL_TABLET | Freq: Three times a day (TID) | ORAL | 6 refills | Status: DC | PRN

## 2016-12-22 NOTE — Progress Notes (Signed)
Reason for visit: Gait disorder  [name] is an 76 y.o. female  History of present illness:  [name] is a 76 year old right-handed white female with a history of a multifactorial gait disorder. The patient has hydrocephalus, status post VP shunt placement, cervical spondylosis, rheumatoid arthritis with degenerative arthritis, and significant hip problems. The patient has left greater than right hip flexion weakness. The patient not had any falls, she exercises on a regular basis, she has an indoor swimming pool at her home and she swims regularly. The patient walks with a walker. She can walk about 50 yards without stopping. She returns to the office today for an evaluation. She takes Robaxin 2 or 3 times a day, trazodone at night. She is not having a lot of discomfort or pain currently.  Past Medical History:  Diagnosis Date  . Abnormality of gait 04/17/2013  . Cervical spondylosis   . Cervicogenic headache    Right occipital neuralgia  . Depression   . Dyslipidemia   . Gait disorder   . Hyperlipidemia   . Hypertension   . Normal pressure hydrocephalus   . Occipital neuralgia 04/17/2013   right  . 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 Rheumatoid arthritis(714.0)   . Vertigo 02/19/2015    Past Surgical History:  Procedure Laterality Date  . CERVICAL SPINE SURGERY    . JOINT REPLACEMENT    . LUMBAR SPINE SURGERY    . OVARY SURGERY    . REPLACEMENT TOTAL KNEE Left   . VENTRICULO-PERITONEAL SHUNT PLACEMENT / LAPAROSCOPIC INSERTION PERITONEAL CATHETER      Family History  Problem Relation Age of Onset  . Ovarian cancer Mother   . Heart disease Father   . Brain cancer Sister     Social history:  reports that she has quit smoking. She has never used smokeless tobacco. She reports that she does not drink alcohol or use drugs.    Allergies  Allergen Reactions  . Methotrexate Derivatives Other (See Comments)    Dangerously decreased platelet count  .  Penicillins Itching  . Sulfa Antibiotics Itching    Medications:  Prior to Admission medications   Medication Sig Start Date End Date Taking? Authorizing Provider  acetaminophen (TYLENOL) 500 MG tablet Take 500 mg by mouth 3 (three) times daily as needed for mild pain.    Yes Historical Provider, MD  amLODipine (NORVASC) 5 MG tablet Take 5 mg by mouth daily.     Yes Historical Provider, MD  Bee Pollen 550 MG CAPS Take 1 capsule by mouth daily.    Yes Historical Provider, MD  Biotin (BIOTIN 5000) 5 MG CAPS Take 5,000 capsules by mouth daily.   Yes Historical Provider, MD  Calcium-Vitamin D-Vitamin K (CALCIUM SOFT CHEWS PO) Take by mouth daily.   Yes Historical Provider, MD  labetalol (NORMODYNE) 100 MG tablet Take 50 mg by mouth twice daily    Yes Historical Provider, MD  leflunomide (ARAVA) 10 MG tablet Take 10 mg by mouth daily.   Yes Historical Provider, MD  meclizine (ANTIVERT) 25 MG tablet Take 1 tablet (25 mg total) by mouth 3 (three) times daily as needed for dizziness. 02/18/15  Yes Quintella Reichert, MD  methocarbamol (ROBAXIN) 500 MG tablet Take 1 tablet (500 mg total) by mouth 3 (three) times daily as needed. 12/22/16  Yes Kathrynn Ducking, MD  Multiple Vitamin (MULTIVITAMIN) tablet Take 1 tablet by mouth daily.   Yes Historical Provider, MD  ondansetron [facility]) 4  MG tablet Take 1 tablet (4 mg total) by mouth every 6 (six) hours. 02/18/15  Yes Quintella Reichert, MD  traZODone (DESYREL) 50 MG tablet Take 1 tablet (50 mg total) by mouth at bedtime. Patient taking differently: Take 25 mg by mouth at bedtime.  03/05/16  Yes Kathrynn Ducking, MD    ROS:  Out of a complete 14 system review of symptoms, the patient complains only of the following symptoms, and all other reviewed systems are negative.  Walking difficulty, neck stiffness Leg weakness  Blood pressure (!) 158/73, pulse 64, height 5\' 7"  (1.702 m), weight 153 lb (69.4 kg).  Physical Exam  General: The patient is alert and  cooperative at the time of the examination.  Skin: No significant peripheral edema is noted.   Neurologic Exam  Mental status: The patient is alert and oriented x 3 at the time of the examination. The patient has apparent normal recent and remote memory, with an apparently normal attention span and concentration ability.   Cranial nerves: Facial symmetry is present. Speech is normal, no aphasia or dysarthria is noted. Extraocular movements are full. Visual fields are full.  Motor: The patient has good strength in all 4 extremities, with exception of 3/5 strength with hip flexion on the left, 4/5 on the right.  Sensory examination: Soft touch sensation is symmetric on the face, arms, and legs.  Coordination: The patient has good finger-nose-finger bilaterally.  Gait and station: The patient is able to ambulate with a walker. The patient takes relatively good stride with a walker, and gait was not attempted.  Reflexes: Deep tendon reflexes are symmetric, but are depressed in the legs.   Assessment/Plan:  1. Multifactorial gait disorder  2. Rheumatoid arthritis  3. Normal pressure hydrocephalus, status post shunt placement  4. Cervical spondylosis  The patient is maintaining mobility fairly well, she exercises on a regular basis. She is asked about potentially getting acupuncture, I do not see that this would hurt anything. The patient was given a prescription for her Robaxin, and she will follow-up in one year. The patient appears to be remaining relatively stable.   Jill Alexanders MD 12/22/2016 1:50 PM  [facility] [address] [place], [place] [address]  Phone [phone] Fax [phone]

## 2016-12-30 ENCOUNTER — Encounter: Payer: Self-pay | Admitting: *Deleted

## 2016-12-30 NOTE — Progress Notes (Signed)
Submitted PA methocarbamol to covermymeds. Key: [id number]. Awaiting response.

## 2016-12-31 NOTE — Progress Notes (Signed)
PA approved effective from 12/30/2016 through 12/30/2017.

## 2017-08-04 ENCOUNTER — Other Ambulatory Visit: Payer: Self-pay | Admitting: *Deleted

## 2017-08-04 MED ORDER — TRAZODONE HCL 50 MG PO TABS: 50.0000 mg | ORAL_TABLET | Freq: Every day | ORAL | 1 refills | Status: DC

## 2017-10-06 ENCOUNTER — Telehealth: Payer: Self-pay | Admitting: Neurology

## 2017-10-06 ENCOUNTER — Encounter: Payer: Self-pay | Admitting: Neurology

## 2017-10-06 ENCOUNTER — Ambulatory Visit (INDEPENDENT_AMBULATORY_CARE_PROVIDER_SITE_OTHER): Payer: Medicare Other | Admitting: Neurology

## 2017-10-06 VITALS — BP 140/81 | HR 71 | Wt 159.0 lb

## 2017-10-06 DIAGNOSIS — M5412 Radiculopathy, cervical region: Secondary | ICD-10-CM

## 2017-10-06 MED ORDER — METHOCARBAMOL 500 MG PO TABS: 500.0000 mg | ORAL_TABLET | Freq: Three times a day (TID) | ORAL | 6 refills | Status: DC | PRN

## 2017-10-06 MED ORDER — PREDNISONE 10 MG PO TABS: ORAL_TABLET | ORAL | 0 refills | Status: DC

## 2017-10-06 NOTE — Telephone Encounter (Signed)
Ms. Cashin called today.  She had spontaneous onset of right upper extremity numbness and weakness, she has pain in the shoulder blade and down the arm, no neck pain.  This issue came on spontaneously 4 days ago.  The patient denies any increased symptoms with head turning one side to the next.  Patient will be placed on a prednisone Dosepak, we will need to get a revisit to evaluate this issue, the patient likely has a cervical radiculopathy.

## 2017-10-06 NOTE — Progress Notes (Signed)
Reason for visit: Right arm pain  [name] is an 76 y.o. female  History of present illness:  [name] is a 76 year old right-handed white female with a history of hydrocephalus, status post VP shunt placement, cervical spondylosis, rheumatoid arthritis and degenerative arthritis.  The patient has chronic left greater than right hip flexion weakness.  She walks with a walker, she reports no recent falls.  She reports a spontaneous onset of right shoulder and shoulder blade pain and pain down into the right arm, mainly in the triceps area.  She may have some tingling down to the hand.  She reports some weakness in the right arm that has developed.  She is on methocarbamol, we called in a prednisone Dosepak today, she has already started this medication.  She reports that when she turns her head to the right he may have some increased discomfort into the right shoulder.  She does not have restriction of movement across the shoulder.  She denies any pain down the left arm.  Past Medical History:  Diagnosis Date  . Abnormality of gait 04/17/2013  . Cervical spondylosis   . Cervicogenic headache    Right occipital neuralgia  . Depression   . Dyslipidemia   . Gait disorder   . Hyperlipidemia   . Hypertension   . Normal pressure hydrocephalus   . Occipital neuralgia 04/17/2013   right  . 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 Rheumatoid arthritis(714.0)   . Vertigo 02/19/2015    Past Surgical History:  Procedure Laterality Date  . CERVICAL SPINE SURGERY    . JOINT REPLACEMENT    . LUMBAR SPINE SURGERY    . OVARY SURGERY    . REPLACEMENT TOTAL KNEE Left   . VENTRICULO-PERITONEAL SHUNT PLACEMENT / LAPAROSCOPIC INSERTION PERITONEAL CATHETER      Family History  Problem Relation Age of Onset  . Ovarian cancer Mother   . Heart disease Father   . Brain cancer Sister     Social history:  reports that she has quit smoking. she has never used smokeless tobacco. She reports  that she does not drink alcohol or use drugs.    Allergies  Allergen Reactions  . Methotrexate Derivatives Other (See Comments)    Dangerously decreased platelet count  . Penicillins Itching  . Sulfa Antibiotics Itching    Medications:  Prior to Admission medications   Medication Sig Start Date End Date Taking? Authorizing Provider  acetaminophen (TYLENOL) 500 MG tablet Take 500 mg by mouth 3 (three) times daily as needed for mild pain.    Yes [provider]  amLODipine (NORVASC) 5 MG tablet Take 5 mg by mouth daily.     Yes [provider]  Bee Pollen 550 MG CAPS Take 1 capsule by mouth daily.    Yes [provider]  Biotin (BIOTIN 5000) 5 MG CAPS Take 5,000 capsules by mouth daily.   Yes [provider]  Calcium-Vitamin D-Vitamin K (CALCIUM SOFT CHEWS PO) Take by mouth daily.   Yes [provider]  labetalol (NORMODYNE) 100 MG tablet Take 50 mg by mouth twice daily    Yes [provider]  leflunomide (ARAVA) 10 MG tablet Take 10 mg by mouth daily.   Yes [provider]  meclizine (ANTIVERT) 25 MG tablet Take 1 tablet (25 mg total) by mouth 3 (three) times daily as needed for dizziness. 02/18/15  Yes Quintella Reichert, MD  methocarbamol (ROBAXIN) 500 MG tablet Take 1 tablet (  500 mg total) by mouth 3 (three) times daily as needed. 12/22/16  Yes Kathrynn Ducking, MD  Multiple Vitamin (MULTIVITAMIN) tablet Take 1 tablet by mouth daily.   Yes [provider]  ondansetron (ZOFRAN) 4 MG tablet Take 1 tablet (4 mg total) by mouth every 6 (six) hours. 02/18/15  Yes Quintella Reichert, MD  predniSONE (DELTASONE) 10 MG tablet Begin taking 6 tablets daily, taper by one tablet every other day until off the medication. 10/06/17  Yes Kathrynn Ducking, MD  traZODone (DESYREL) 50 MG tablet Take 1 tablet (50 mg total) by mouth at bedtime. Patient taking differently: Take 25 mg by mouth at bedtime.  08/04/17  Yes Kathrynn Ducking, MD     ROS:  Out of a complete 14 system review of symptoms, the patient complains only of the following symptoms, and all other reviewed systems are negative.  Fatigue Insomnia Joint pain, aching muscles, walking difficulty, neck stiffness Weakness  Blood pressure 140/81, pulse 71, weight 159 lb (72.1 kg).  Physical Exam  General: The patient is alert and cooperative at the time of the examination.  Skin: No significant peripheral edema is noted.   Neurologic Exam  Mental status: The patient is alert and oriented x 3 at the time of the examination. The patient has apparent normal recent and remote memory, with an apparently normal attention span and concentration ability.   Cranial nerves: Facial symmetry is present. Speech is normal, no aphasia or dysarthria is noted. Extraocular movements are full. Visual fields are full.  Motor: The patient has good strength in all the left upper extremity.  On the right upper extremity there is some weakness with the triceps muscle and with finger extension, possibly with pronation of the right arm.  With the lower extremities, there is 3/5 strength with hip flexion on the left, 4-/5 strength with hip flexion on the right.  There is some weakness with knee extension on the left as well, not present on the right..  Sensory examination: Soft touch sensation is symmetric on the face, arms, and legs.  Coordination: The patient has good finger-nose-finger and heel-to-shin bilaterally.  Gait and station: The patient has a wide-based gait, the patient walks with a walker.  Reflexes: Deep tendon reflexes are symmetric.   Assessment/Plan:  1.  History of hydrocephalus, VP shunt placement  2.  Chronic gait disorder, leg weakness  3.  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  She is weak in the right arm, she has pain down the  right arm.  The patient will be sent for MRI of the cervical spine.  She will continue the prednisone Dosepak.  I will contact her when I get the results of the MRI.  A prescription for methocarbamol was sent in.  Following the MRI, the VP shunt may need to be rechecked by Dr. Christella Noa.  Jill Alexanders MD 10/06/2017 12:51 PM  [facility] [address] [place], [place] [address]  Phone [phone] Fax [phone]

## 2017-10-06 NOTE — Patient Instructions (Signed)
   We will get MRI of the cervical spine. Stay on the prednisone

## 2017-10-06 NOTE — Telephone Encounter (Signed)
Tried pt again on home phone. Spoke with husband. They will come today at 12pm, check in 1130am for work in visit

## 2017-10-06 NOTE — Telephone Encounter (Signed)
Called, LVM for pt on home/cell to call office back to schedule work in visit. Can offer today at 12pm, check in 1130am if she calls

## 2017-10-07 ENCOUNTER — Ambulatory Visit: Payer: Medicare Other | Admitting: Neurology

## 2017-10-18 ENCOUNTER — Ambulatory Visit
Admission: RE | Admit: 2017-10-18 | Discharge: 2017-10-18 | Disposition: A | Discharge status: Home / Self Care | Payer: Medicare Other | Source: Ambulatory Visit | Attending: Neurology | Admitting: Neurology

## 2017-10-18 ENCOUNTER — Telehealth: Payer: Self-pay | Admitting: Neurology

## 2017-10-18 DIAGNOSIS — M5412 Radiculopathy, cervical region: Secondary | ICD-10-CM

## 2017-10-18 NOTE — Telephone Encounter (Signed)
I called the patient.  Concern was that the patient had a right C7 nerve root problem, there does appear to be some neuroforaminal stenosis at this level, possibly affecting the C8 nerve root as well.  If the prednisone has not helped her discomfort and weakness, she will call our office and we may consider an epidural steroid injection.  No evidence of spinal cord compression.   MRI cervical 10/18/17:  IMPRESSION:  This MRI of the cervical spine without contrast shows multilevel degenerative changes as detailed above. The most significant findings are: 1.    Solid fusion from C4-C6.   At C5-C6 there is a bony ridge on the left causing mild left foraminal narrowing and mild left canal stenosis but no spinal cord or nerve root compression. 2.    At C2-C3 there is mild spinal stenosis and minimal left neural foraminal narrowing. These changes have mildly progressive compared to the 12/05/2013 MRI.  3.    At C6-C7 there is a right disc osteophyte complex causing mild spinal stenosis and moderate right foraminal narrowing. Although there is no definite nerve root compression there is some impingement upon the right C7 nerve root.   This level appears stable compared to the 2015 MRI. 4.    At C7-T1, there is a right disc osteophyte complex causing moderate right foraminal narrowing.   There is no definite nerve root compression with there is some encroachment upon the right C8 nerve root.  The changes have mildly progressive compared to the 2015 MRI.

## 2017-10-19 NOTE — Telephone Encounter (Addendum)
Pt returned Dr Jannifer Franklin call. She is wanting to know if she should continue the prednisone.  She also wants to know if she should see her neurosurgeon to reprogram her shunt since having the MRI. Said she has LVM for Dr Lacy Duverney RN to call her regarding reprogramming her shunt if this is needed. Pt also is wanting to have the epidural steroid injection. Please call to advise at [phone]

## 2017-10-19 NOTE — Telephone Encounter (Signed)
I called the patient.  The patient is still having paresthesias down the right arm, still having discomfort.  The prednisone helped only transiently, once she comes off the medication if she is still having problems, she will call and we will consider an epidural steroid injection.  She is to contact Dr. Christella Noa to have the shunt checked and reprogrammed if necessary.

## 2017-11-25 ENCOUNTER — Other Ambulatory Visit: Payer: Self-pay | Admitting: Neurology

## 2017-12-21 ENCOUNTER — Ambulatory Visit (INDEPENDENT_AMBULATORY_CARE_PROVIDER_SITE_OTHER): Payer: Medicare Other | Admitting: Neurology

## 2017-12-21 ENCOUNTER — Encounter: Payer: Self-pay | Admitting: Neurology

## 2017-12-21 VITALS — BP 161/84 | HR 73 | Wt 157.0 lb

## 2017-12-21 DIAGNOSIS — M47812 Spondylosis without myelopathy or radiculopathy, cervical region: Secondary | ICD-10-CM | POA: Diagnosis not present

## 2017-12-21 DIAGNOSIS — R269 Unspecified abnormalities of gait and mobility: Secondary | ICD-10-CM | POA: Diagnosis not present

## 2017-12-21 DIAGNOSIS — G912 (Idiopathic) normal pressure hydrocephalus: Secondary | ICD-10-CM

## 2017-12-21 NOTE — Progress Notes (Signed)
Reason for visit: Gait disorder  [name] is an 77 y.o. 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  The patient uses a walker for ambulation, she has not had any falls since last seen.  The patient had developed pain, numbness, and weakness of the right arm, she was found to have some possible impingement of the C8 nerve root on the right.  A trial on prednisone was helpful, the patient has had full resolution of her symptoms, the strength of the arm has returned.  The patient has had no other changes in her clinical condition.  She does have fatigue if she walks longer distances.  She is trying to do some toning exercises.  The patient has no new medical issues that occurred at this time.  Past Medical History:  Diagnosis Date  . Abnormality of gait 04/17/2013  . Cervical spondylosis   . Cervicogenic headache    Right occipital neuralgia  . Depression   . Dyslipidemia   . Gait disorder   . Hyperlipidemia   . Hypertension   . Normal pressure hydrocephalus   . Occipital neuralgia 04/17/2013   right  . 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 Rheumatoid arthritis(714.0)   . Vertigo 02/19/2015    Past Surgical History:  Procedure Laterality Date  . CERVICAL SPINE SURGERY    . JOINT REPLACEMENT    . LUMBAR SPINE SURGERY    . OVARY SURGERY    . REPLACEMENT TOTAL KNEE Left   . VENTRICULO-PERITONEAL SHUNT PLACEMENT / LAPAROSCOPIC INSERTION PERITONEAL CATHETER      Family History  Problem Relation Age of Onset  . Ovarian cancer Mother   . Heart disease Father   . Brain cancer Sister     Social history:  reports that she has quit smoking. she has never used smokeless tobacco. She reports that she does not drink alcohol or use drugs.    Allergies  Allergen Reactions  . Methotrexate Derivatives Other (See Comments)     Dangerously decreased platelet count  . Penicillins Itching  . Sulfa Antibiotics Itching    Medications:  Prior to Admission medications   Medication Sig Start Date End Date Taking? Authorizing Provider  acetaminophen (TYLENOL) 500 MG tablet Take 500 mg by mouth 3 (three) times daily as needed for mild pain.    Yes [provider]  amLODipine (NORVASC) 5 MG tablet Take 5 mg by mouth daily.     Yes [provider]  Bee Pollen 550 MG CAPS Take 1 capsule by mouth daily.    Yes [provider]  Biotin (BIOTIN 5000) 5 MG CAPS Take 5,000 capsules by mouth daily.   Yes [provider]  Calcium-Vitamin D-Vitamin K (CALCIUM SOFT CHEWS PO) Take by mouth daily.   Yes [provider]  labetalol (NORMODYNE) 100 MG tablet Take 50 mg by mouth twice daily    Yes [provider]  leflunomide (ARAVA) 10 MG tablet Take 10 mg by mouth daily.   Yes [provider]  meclizine (ANTIVERT) 25 MG tablet Take 1 tablet (25 mg total) by mouth 3 (three) times daily as needed for dizziness. 02/18/15  Yes Quintella Reichert, MD  methocarbamol (ROBAXIN) 500 MG tablet Take 1 tablet (500 mg total) by mouth 3 (three) times daily as needed. 10/06/17  Yes  Kathrynn Ducking, MD  Multiple Vitamin (MULTIVITAMIN) tablet Take 1 tablet by mouth daily.   Yes [provider]  ondansetron (ZOFRAN) 4 MG tablet Take 1 tablet (4 mg total) by mouth every 6 (six) hours. 02/18/15  Yes Quintella Reichert, MD  predniSONE (DELTASONE) 10 MG tablet Begin taking 6 tablets daily, taper by one tablet every other day until off the medication. 10/06/17  Yes Kathrynn Ducking, MD  traZODone (DESYREL) 50 MG tablet Take 1 tablet (50 mg total) by mouth at bedtime. Patient taking differently: Take 25 mg by mouth at bedtime.  08/04/17  Yes Kathrynn Ducking, MD    ROS:  Out of a complete 14 system review of symptoms, the patient complains only of the following symptoms, and all other reviewed  systems are negative.  Leg weakness  Blood pressure (!) 161/84, pulse 73, weight 157 lb (71.2 kg).  Physical Exam  General: The patient is alert and cooperative at the time of the examination.  Skin: No significant peripheral edema is noted.   Neurologic Exam  Mental status: The patient is alert and oriented x 3 at the time of the examination. The patient has apparent normal recent and remote memory, with an apparently normal attention span and concentration ability.   Cranial nerves: Facial symmetry is present. Speech is normal, no aphasia or dysarthria is noted. Extraocular movements are full. Visual fields are full.  Motor: The patient has good strength in all 4 extremities, with exception of a left foot drop and significant weakness with hip flexion on the left, trace weakness with hip flexion on the right is noted.  Sensory examination: Soft touch sensation is symmetric on the face, arms, and legs.  Coordination: The patient has good finger-nose-finger and heel-to-shin bilaterally.  Gait and station: The patient has a slightly wide-based gait, the patient uses a walker for ambulation and does well with this.  Tandem gait was not attempted.  Romberg is negative but is slightly unsteady.  Reflexes: Deep tendon reflexes are symmetric, but are decreased.   Assessment/Plan:  1.  Gait disorder, left leg weakness  2.  Cervical radiculopathy, resolved  The patient is doing much better with her right arm.  The patient is not having any pain in the leg, but she is having some weakness with hip flexion on the left that is chronic in nature.  The patient is encouraged to do toning exercises on a regular basis.  MRI of the lumbar spine did not show an etiology of her hip flexion weakness and EMG study did not show significant chronic or active denervation in the iliopsoas muscle.  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  She will follow-up in 6 months, she will call for any new issues.  Margaret Alexanders MD 12/21/2017 10:39 AM  [facility] [address] [place], [place] [address]  Phone [phone] Fax [phone]

## 2018-05-15 ENCOUNTER — Other Ambulatory Visit: Payer: Self-pay | Admitting: Neurology

## 2018-07-05 ENCOUNTER — Encounter: Payer: Self-pay | Admitting: Neurology

## 2018-07-05 ENCOUNTER — Ambulatory Visit (INDEPENDENT_AMBULATORY_CARE_PROVIDER_SITE_OTHER): Payer: Medicare Other | Admitting: Neurology

## 2018-07-05 VITALS — BP 138/70 | HR 67 | Ht 67.0 in | Wt 155.5 lb

## 2018-07-05 DIAGNOSIS — G912 (Idiopathic) normal pressure hydrocephalus: Secondary | ICD-10-CM

## 2018-07-05 DIAGNOSIS — R269 Unspecified abnormalities of gait and mobility: Secondary | ICD-10-CM

## 2018-07-05 MED ORDER — METHOCARBAMOL 750 MG PO TABS: 750.0000 mg | ORAL_TABLET | Freq: Three times a day (TID) | ORAL | 3 refills | Status: DC | PRN

## 2018-07-05 NOTE — Progress Notes (Signed)
Reason for visit: Gait disorder, normal pressure hydrocephalus  [name] is an 77 y.o. female  History of present illness:  [name] is a 77 year old right-handed white female with a history of normal pressure hydrocephalus, status post a VP shunt placement.  The patient is seen Dr. Christella Noa previously for this.  The patient has reported some changes in bladder function, she has more urgency and occasional incontinence at this point.  She has not noted a change in her ability to walk, she continues to report some problems with fatigue, she can walk on the treadmill for about 8 minutes before she has to stop.  She has not had any falls, she uses a rolling walker to ambulate.  The patient has had episodes of positional vertigo off and on over the last 2 years, she had no symptoms between May and August of this year, but she has had recurrence of symptoms over the last 2 or 3 weeks.  She is doing the Epley maneuvers at home, but she is still having some issues with vertigo when she gets up out of bed in the morning.  She takes meclizine off and on.  She is living at [facility], there is a physical therapist there that can do vestibular rehabilitation.  She continues to have issues with flexion of the hip on the left, this is a chronic problem, the etiology was never determined.  She has a leg length discrepancy, the left femur is shorter following a fracture.  She has not had a hip replacement on the left.  She comes to this office for an evaluation.  She claims that the 500 mg Robaxin is not helpful, if she takes at thousand milligrams this does help.  Past Medical History:  Diagnosis Date  . Abnormality of gait 04/17/2013  . Cervical spondylosis   . Cervicogenic headache    Right occipital neuralgia  . Depression   . Dyslipidemia   . Gait disorder   . Hyperlipidemia   . Hypertension   . Normal pressure hydrocephalus   . Occipital neuralgia 04/17/2013   right  . Osteomyelitis of left knee  region ([place])   . Rheumatoid arthritis(714.0)   . Vertigo 02/19/2015    Past Surgical History:  Procedure Laterality Date  . CERVICAL SPINE SURGERY    . JOINT REPLACEMENT    . LUMBAR SPINE SURGERY    . OVARY SURGERY    . REPLACEMENT TOTAL KNEE Left   . VENTRICULO-PERITONEAL SHUNT PLACEMENT / LAPAROSCOPIC INSERTION PERITONEAL CATHETER      Family History  Problem Relation Age of Onset  . Ovarian cancer Mother   . Heart disease Father   . Brain cancer Sister     Social history:  reports that she has quit smoking. She has never used smokeless tobacco. She reports that she does not drink alcohol or use drugs.    Allergies  Allergen Reactions  . Methotrexate Derivatives Other (See Comments)    Dangerously decreased platelet count  . Penicillins Itching  . Sulfa Antibiotics Itching    Medications:  Prior to Admission medications   Medication Sig Start Date End Date Taking? Authorizing Provider  acetaminophen (TYLENOL) 500 MG tablet Take 1,000 mg by mouth 3 (three) times daily as needed for mild pain.    Yes [provider]  amLODipine (NORVASC) 5 MG tablet Take 5 mg by mouth daily.     Yes [provider]  Bee Pollen 550 MG CAPS Take 1 capsule by mouth  daily.    Yes [provider]  Biotin (BIOTIN 5000) 5 MG CAPS Take 5,000 capsules by mouth daily.   Yes [provider]  Calcium-Vitamin D-Vitamin K (CALCIUM SOFT CHEWS PO) Take by mouth daily.   Yes [provider]  labetalol (NORMODYNE) 100 MG tablet Take 50 mg by mouth twice daily    Yes [provider]  LATANOPROST OP Place 1 Dose into both eyes at bedtime. 08/02/17  Yes [provider]  leflunomide (ARAVA) 10 MG tablet Take 10 mg by mouth daily.   Yes [provider]  meclizine (ANTIVERT) 25 MG tablet Take 1 tablet (25 mg total) by mouth 3 (three) times daily as needed for dizziness. 02/18/15  Yes Quintella Reichert, MD  Multiple Vitamin (MULTIVITAMIN) tablet  Take 1 tablet by mouth daily.   Yes [provider]  ondansetron (ZOFRAN) 4 MG tablet Take 1 tablet (4 mg total) by mouth every 6 (six) hours. 02/18/15  Yes Quintella Reichert, MD  predniSONE (DELTASONE) 1 MG tablet Take 2 mg by mouth daily.    Yes [provider]  traZODone (DESYREL) 50 MG tablet TAKE 1 TABLET BY MOUTH AT BEDTIME. GENERIC EQUIVALENT FOR DESYREL 05/16/18  Yes Kathrynn Ducking, MD  methocarbamol (ROBAXIN-750) 750 MG tablet Take 1 tablet (750 mg total) by mouth every 8 (eight) hours as needed for muscle spasms. 07/05/18   Kathrynn Ducking, MD    ROS:  Out of a complete 14 system review of symptoms, the patient complains only of the following symptoms, and all other reviewed systems are negative.  Urinary urgency Vertigo  Blood pressure 138/70, pulse 67, height 5\' 7"  (1.702 m), weight 155 lb 8 oz (70.5 kg).  Physical Exam  General: The patient is alert and cooperative at the time of the examination.  Skin: No significant peripheral edema is noted.   Neurologic Exam  Mental status: The patient is alert and oriented x 3 at the time of the examination. The patient has apparent normal recent and remote memory, with an apparently normal attention span and concentration ability.   Cranial nerves: Facial symmetry is present. Speech is normal, no aphasia or dysarthria is noted. Extraocular movements are full. Visual fields are full.  Motor: The patient has good strength in all 4 extremities, with exception that she has 3/5 strength with hip flexion on the left.  Sensory examination: Soft touch sensation is symmetric on the face, arms, and legs.  Coordination: The patient has good finger-nose-finger and heel-to-shin bilaterally, with exception that she cannot perform heel-to-shin with the left leg.  Gait and station: The patient has a slightly wide-based gait, she uses a walker for ambulation.  She is able to walk fairly well with the walker.  Romberg is negative.   Tandem gait was not attempted.  Reflexes: Deep tendon reflexes are symmetric.   Assessment/Plan:  1.  Normal pressure hydrocephalus  2.  Gait disturbance  3.  Weakness with flexion, left hip  4.  Benign positional vertigo  5.  Urinary urgency and incontinence  I have encouraged the patient to see a urology physician for her bladder.  She will contact her physical therapist at [facility] for vestibular rehabilitation.  The patient was given a prescription for the 750 mg Robaxin tablets.  She will follow-up in 6 months.  Jill Alexanders MD 07/05/2018 10:24 AM  [facility] [address] [place], [place] [address]  Phone [phone] Fax [phone]

## 2018-09-06 ENCOUNTER — Other Ambulatory Visit: Payer: Self-pay | Admitting: Neurology

## 2018-10-27 ENCOUNTER — Telehealth: Payer: Self-pay | Admitting: Neurology

## 2018-10-27 NOTE — Telephone Encounter (Signed)
Pt states she is needing an appt. sooner that her 01/11/19 appt due to having so much pain in her legs and trouble walking. Please advise.

## 2018-10-28 NOTE — Telephone Encounter (Signed)
I contacted the pt and advised via vm we could see her Tuesday 11/01/2018 at 9:30 check in time of 9 am for a new pt visit. I advised the pt to call back and verify if she could make this appt. Pt advised we would not schedule until we heard from her. If pt calls back please schedule for 11/01/18 at 9:30 am.

## 2018-10-28 NOTE — Telephone Encounter (Signed)
Pt called back and was scheduled for 11/01/18 @ 9:30 with a check in time of 9am.

## 2018-11-01 ENCOUNTER — Ambulatory Visit (INDEPENDENT_AMBULATORY_CARE_PROVIDER_SITE_OTHER): Payer: Medicare Other | Admitting: Neurology

## 2018-11-01 ENCOUNTER — Encounter: Payer: Self-pay | Admitting: Neurology

## 2018-11-01 ENCOUNTER — Telehealth: Payer: Self-pay | Admitting: Neurology

## 2018-11-01 VITALS — BP 152/80 | HR 66 | Ht 67.0 in | Wt 158.3 lb

## 2018-11-01 DIAGNOSIS — R269 Unspecified abnormalities of gait and mobility: Secondary | ICD-10-CM

## 2018-11-01 DIAGNOSIS — M47812 Spondylosis without myelopathy or radiculopathy, cervical region: Secondary | ICD-10-CM | POA: Diagnosis not present

## 2018-11-01 DIAGNOSIS — G3281 Cerebellar ataxia in diseases classified elsewhere: Secondary | ICD-10-CM

## 2018-11-01 DIAGNOSIS — G912 (Idiopathic) normal pressure hydrocephalus: Secondary | ICD-10-CM

## 2018-11-01 DIAGNOSIS — R29898 Other symptoms and signs involving the musculoskeletal system: Secondary | ICD-10-CM | POA: Diagnosis not present

## 2018-11-01 NOTE — Progress Notes (Signed)
Reason for visit: Normal pressure hydrocephalus, left leg weakness, gait disorder  [name] is an 78 y.o. 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  The patient has a history of a cervical myelopathy, she has chronic weakness of the left leg, she has had a prior left total knee replacement.  She has degenerative arthritis affecting the right knee.  She comes in today with reports of a gradual change in her ability to walk over the last several months, she feels as if she is fatiguing more when she walks, she normally walks about 8 minutes on the treadmill, she can now only walk about 5 minutes.  She can walk about 150 feet with her walker before fatiguing.  The patient may have some discomfort in the left leg below the knee but her main issue is with the sensation of left leg weakness.  She denies any significant low back pain or pain down the leg, she does report some urinary urgency that may have worsened recently as well.  She has undergone some physical therapy recently, she is working on leg strengthening exercises.  She comes to this office for an evaluation.  Past Medical History:  Diagnosis Date  . Abnormality of gait 04/17/2013  . Cervical spondylosis   . Cervicogenic headache    Right occipital neuralgia  . Depression   . Dyslipidemia   . Gait disorder   . Hyperlipidemia   . Hypertension   . Normal pressure hydrocephalus ([facility])   . Occipital neuralgia 04/17/2013   right  . 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 Rheumatoid arthritis(714.0)   . Vertigo 02/19/2015    Past Surgical History:  Procedure Laterality Date  . CERVICAL SPINE SURGERY    . JOINT REPLACEMENT    . LUMBAR SPINE SURGERY    . OVARY SURGERY    . REPLACEMENT TOTAL KNEE Left   . VENTRICULO-PERITONEAL SHUNT PLACEMENT / LAPAROSCOPIC INSERTION PERITONEAL CATHETER       Family History  Problem Relation Age of Onset  . Ovarian cancer Mother   . Heart disease Father   . Brain cancer Sister     Social history:  reports that she has quit smoking. She has never used smokeless tobacco. She reports that she does not drink alcohol or use drugs.    Allergies  Allergen Reactions  . Methotrexate Derivatives Other (See Comments)    Dangerously decreased platelet count  . Penicillins Itching  . Sulfa Antibiotics Itching    Medications:  Prior to Admission medications   Medication Sig Start Date End Date Taking? Authorizing Provider  acetaminophen (TYLENOL) 500 MG tablet Take 1,000 mg by mouth 3 (three) times daily as needed for mild pain.    Yes [provider]  amLODipine (NORVASC) 5 MG tablet Take 5 mg by mouth daily.     Yes [provider]  Calcium-Vitamin D-Vitamin K (CALCIUM SOFT CHEWS PO) Take by mouth daily.   Yes [provider]  labetalol (NORMODYNE) 100 MG tablet Take 50 mg by mouth twice daily    Yes [provider]  LATANOPROST OP Place 1 Dose into both eyes at bedtime. 08/02/17  Yes [provider]  leflunomide (ARAVA) 10 MG tablet Take 10 mg by mouth daily.   Yes [provider]  meclizine (ANTIVERT) 25 MG tablet Take 1 tablet (25 mg total) by  mouth 3 (three) times daily as needed for dizziness. 02/18/15  Yes Quintella Reichert, MD  methocarbamol (ROBAXIN-750) 750 MG tablet Take 1 tablet (750 mg total) by mouth every 8 (eight) hours as needed for muscle spasms. 07/05/18  Yes Kathrynn Ducking, MD  Multiple Vitamin (MULTIVITAMIN) tablet Take 1 tablet by mouth daily.   Yes [provider]  ondansetron (ZOFRAN) 4 MG tablet Take 1 tablet (4 mg total) by mouth every 6 (six) hours. Patient taking differently: Take 4 mg by mouth as needed.  02/18/15  Yes Quintella Reichert, MD  predniSONE (DELTASONE) 1 MG tablet Take 2 mg by mouth daily.    Yes [provider]  traZODone (DESYREL) 50 MG  tablet TAKE 1 TABLET BY MOUTH AT BEDTIME. GENERIC EQUIVALENT FOR DESYREL Patient taking differently: 25 mg.  05/16/18  Yes Kathrynn Ducking, MD  traZODone (DESYREL) 50 MG tablet TAKE 1 TABLET BY MOUTH AT BEDTIME. GENERIC EQUIVALENT FOR DESYREL 09/06/18  Yes Kathrynn Ducking, MD    ROS:  Out of a complete 14 system review of symptoms, the patient complains only of the following symptoms, and all other reviewed systems are negative.  Incontinence of the bladder Joint pain Walking difficulty Dizziness, headache  Blood pressure (!) 152/80, pulse 66, height 5\' 7"  (1.702 m), weight 158 lb 5 oz (71.8 kg).  Physical Exam  General: The patient is alert and cooperative at the time of the examination.  Skin: No significant peripheral edema is noted.   Neurologic Exam  Mental status: The patient is alert and oriented x 3 at the time of the examination. The patient has apparent normal recent and remote memory, with an apparently normal attention span and concentration ability.   Cranial nerves: Facial symmetry is present. Speech is normal, no aphasia or dysarthria is noted. Extraocular movements are full. Visual fields are full.  Motor: The patient has good strength in all 4 extremities, with exception of significant weakness, 3/5 with hip flexion on the left.  Sensory examination: Soft touch sensation is symmetric on the face, arms, and legs.  Coordination: The patient has good finger-nose-finger bilaterally, the patient has difficulty performing heel shin on the left leg, can perform on the right.  Gait and station: The patient is able to walk with a walker, she has good stride and good turns with a walker.  Tandem gait was not attempted.  Romberg is negative.  Reflexes: Deep tendon reflexes are symmetric, but are depressed.   Assessment/Plan:  1.  Reports of alteration of ambulation, fatigue of left leg  2.  History of normal pressure hydrocephalus  3.  History of cervical  myelopathy  The patient reports no new symptoms with her arms, she has not had any neck pain or pain down the arms.  The patient will be sent for CT scan of the brain to review the function of the shunt.  The patient will be set up for nerve conduction studies of both legs and EMG on the left leg, we will compared to a prior study done in 2015.  The patient will follow-up for next scheduled revisit.  Jill Alexanders MD 11/01/2018 9:42 AM  [facility] [address] [place], [place] [address]  Phone [phone] Fax [phone]

## 2018-11-01 NOTE — Telephone Encounter (Signed)
Medicare/BCBS supp order sent to GI lvm for pt to be aware. I left GI phone number of [phone] and to give them a call if she has not heard from them in the next 2-3 business days.

## 2018-11-09 ENCOUNTER — Other Ambulatory Visit: Payer: Medicare Other

## 2018-11-09 ENCOUNTER — Ambulatory Visit
Admission: RE | Admit: 2018-11-09 | Discharge: 2018-11-09 | Disposition: A | Discharge status: Home / Self Care | Payer: Medicare Other | Source: Ambulatory Visit | Attending: Neurology | Admitting: Neurology

## 2018-11-09 DIAGNOSIS — G3281 Cerebellar ataxia in diseases classified elsewhere: Secondary | ICD-10-CM

## 2018-11-09 DIAGNOSIS — G912 (Idiopathic) normal pressure hydrocephalus: Secondary | ICD-10-CM

## 2018-11-10 ENCOUNTER — Telehealth: Payer: Self-pay | Admitting: Neurology

## 2018-11-10 DIAGNOSIS — I63032 Cerebral infarction due to thrombosis of left carotid artery: Secondary | ICD-10-CM

## 2018-11-10 NOTE — Telephone Encounter (Signed)
I called the patient.  The ventriculomegaly is stable from 2016, the patient is had a left caudate infarct at some point in the last several years.  I discussed this with the patient, we will get a carotid Doppler study.   CT head 11/10/18:  IMPRESSION:   CT head (without) demonstrating: - Stable, moderate to severe ventriculomegaly. Stable right frontal shunt catheter.  - Compared to MRI on 5/90/16, there is new hypodensity noted in the left caudate.  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

## 2018-11-30 ENCOUNTER — Ambulatory Visit (INDEPENDENT_AMBULATORY_CARE_PROVIDER_SITE_OTHER): Payer: Medicare Other | Admitting: Neurology

## 2018-11-30 ENCOUNTER — Encounter: Payer: Self-pay | Admitting: Neurology

## 2018-11-30 DIAGNOSIS — R29898 Other symptoms and signs involving the musculoskeletal system: Secondary | ICD-10-CM

## 2018-11-30 DIAGNOSIS — G609 Hereditary and idiopathic neuropathy, unspecified: Secondary | ICD-10-CM

## 2018-11-30 DIAGNOSIS — E538 Deficiency of other specified B group vitamins: Secondary | ICD-10-CM

## 2018-11-30 NOTE — Procedures (Signed)
     HISTORY:  [name] is a 78 year old patient with a history of a chronic gait disorder, she reports fatigue of the left leg.  The patient is being evaluated for this progressive weakness.  NERVE CONDUCTION STUDIES:  Nerve conduction studies were performed on both lower extremities.  The distal motor latencies for the peroneal nerves were prolonged on the left, normal on the right, and normal for the posterior tibial nerves bilaterally.  The motor amplitudes for the posterior tibial nerves bilaterally and for the right peroneal nerve were normal, and low for the left peroneal nerve.  Slowing was seen for the posterior tibial nerves bilaterally and for the right peroneal nerve, unobtainable for the left peroneal nerve.  The sural and peroneal sensory latencies were unobtainable bilaterally.  The F-wave latencies for the posterior tibial nerves were surprisingly normal bilaterally.  EMG STUDIES:  EMG study was performed on the left lower extremity:  The tibialis anterior muscle reveals 2 to 4K motor units with decreased recruitment. No fibrillations or positive waves were seen. The peroneus tertius muscle reveals 2 to 4K motor units with decreased recruitment. No fibrillations or positive waves were seen. The medial gastrocnemius muscle reveals 1 to 3K motor units with full recruitment. No fibrillations or positive waves were seen. The vastus lateralis muscle reveals 2 to 4K motor units with full recruitment. No fibrillations or positive waves were seen. The iliopsoas muscle reveals 2 to 4K motor units with full recruitment. No fibrillations or positive waves were seen. The biceps femoris muscle (long head) reveals 2 to 4K motor units with full recruitment. No fibrillations or positive waves were seen. The lumbosacral paraspinal muscles were tested at 3 levels, and revealed no abnormalities of insertional activity at all 3 levels tested. There was good relaxation.   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  This is a new finding from 73.  The EMG evaluation of the left lower extremity shows findings consistent with a left peroneal neuropathy, no clear evidence of an overlying lumbosacral radiculopathy is seen.  No active denervation is noted.  Jill Alexanders MD 11/30/2018 1:51 PM  [facility] [address] [place], [place] [address]  Phone [phone] Fax [phone]

## 2018-11-30 NOTE — Progress Notes (Addendum)
The patient comes in for EMG and nerve conduction study evaluation.  In comparison to a prior study done in 2015, the patient has evolved a more generalized peripheral neuropathy on the studies today.  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  Blood work will be done today looking for an etiology.  The patient is not able to straighten out the left leg completely, this may be another factor adding to fatigue of the leg with walking.     [place]    Nerve / Sites Muscle Latency Ref. Amplitude Ref. Rel Amp Segments Distance Velocity Ref. Area    ms ms mV mV %  cm m/s m/s mVms  R Peroneal - EDB     Ankle EDB 6.0 ?6.5 2.8 ?2.0 100 Ankle - EDB 9   10.7     Fib head EDB 12.8  2.3  81.9 Fib head - Ankle 28 42 ?44 10.1     Pop fossa EDB 15.2  2.1  92.8 Pop fossa - Fib head 10 41 ?44 8.9         Pop fossa - Ankle      L Peroneal - EDB     Ankle EDB 8.9 ?6.5 0.6 ?2.0 100 Ankle - EDB 9   2.3     Fib head EDB NR  NR  NR Fib head - Ankle 26 NR ?44 NR     Pop fossa EDB 19.6  0.6   Pop fossa - Fib head 10 NR ?44 2.3         Pop fossa - Ankle      R Tibial - AH     Ankle AH 4.9 ?5.8 5.0 ?4.0 100 Ankle - AH 9   14.5     Pop fossa AH 14.5  3.1  62.6 Pop fossa - Ankle 38 39 ?41 13.6  L Tibial - AH     Ankle AH 4.0 ?5.8 6.2 ?4.0 100 Ankle - AH 9   20.1     Pop fossa AH 12.8  4.3  69.1 Pop fossa - Ankle 34 39 ?41 15.3             [facility]    Nerve / Sites Rec. Site Peak Lat Ref.  Amp Ref. Segments Distance    ms ms V V  cm  R Sural - Ankle (Calf)     Calf Ankle NR ?4.4 NR ?6 Calf - Ankle 14  L Sural - Ankle (Calf)     Calf Ankle NR ?4.4 NR ?6 Calf - Ankle 14  R Superficial peroneal - Ankle     Lat leg Ankle NR ?4.4 NR ?6 Lat leg - Ankle 14  L Superficial peroneal - Ankle     Lat leg Ankle NR ?4.4 NR ?6 Lat leg - Ankle 14              F  Wave    Nerve F Lat Ref.   ms ms  R Tibial - AH 55.2 ?56.0  L Tibial - AH 53.6 ?56.0

## 2018-11-30 NOTE — Progress Notes (Signed)
Please refer to EMG and nerve conduction procedure note.  

## 2018-12-03 LAB — MULTIPLE MYELOMA PANEL, SERUM
Albumin SerPl Elph-Mcnc: 3.9 g/dL (ref 2.9–4.4)
Albumin/Glob SerPl: 1.7 (ref 0.7–1.7)
Alpha 1: 0.2 g/dL (ref 0.0–0.4)
Alpha2 Glob SerPl Elph-Mcnc: 0.6 g/dL (ref 0.4–1.0)
B-Globulin SerPl Elph-Mcnc: 0.9 g/dL (ref 0.7–1.3)
Gamma Glob SerPl Elph-Mcnc: 0.6 g/dL (ref 0.4–1.8)
Globulin, Total: 2.3 g/dL (ref 2.2–3.9)
IgA/Immunoglobulin A, Serum: 69 mg/dL (ref 64–422)
IgG (Immunoglobin G), Serum: 735 mg/dL (ref 700–1600)
IgM (Immunoglobulin M), Srm: 82 mg/dL (ref 26–217)
TOTAL PROTEIN: 6.2 g/dL (ref 6.0–8.5)

## 2018-12-03 LAB — VITAMIN B12: Vitamin B-12: 1059 pg/mL (ref 232–1245)

## 2018-12-03 LAB — ANA W/REFLEX: Anti Nuclear Antibody(ANA): NEGATIVE

## 2018-12-03 LAB — B. BURGDORFI ANTIBODIES: Lyme IgG/IgM Ab: <0.91 {ISR} (ref 0.00–0.90)

## 2018-12-03 LAB — RHEUMATOID FACTOR: Rhuematoid fact SerPl-aCnc: 37.5 IU/mL — ABNORMAL HIGH (ref 0.0–13.9)

## 2018-12-03 LAB — ANGIOTENSIN CONVERTING ENZYME: ANGIO CONVERT ENZYME: 45 U/L (ref 14–82)

## 2018-12-12 ENCOUNTER — Ambulatory Visit (HOSPITAL_COMMUNITY)
Admission: RE | Admit: 2018-12-12 | Discharge: 2018-12-12 | Disposition: A | Discharge status: Home / Self Care | Payer: Medicare Other | Source: Ambulatory Visit | Attending: Neurology | Admitting: Neurology

## 2018-12-12 DIAGNOSIS — I63032 Cerebral infarction due to thrombosis of left carotid artery: Secondary | ICD-10-CM | POA: Diagnosis present

## 2018-12-14 ENCOUNTER — Telehealth: Payer: Self-pay

## 2018-12-14 ENCOUNTER — Telehealth: Payer: Self-pay | Admitting: Neurology

## 2018-12-14 NOTE — Telephone Encounter (Signed)
Requested a call back from the pt to further discuss.

## 2018-12-14 NOTE — Telephone Encounter (Signed)
I contacted the pt and was able to speak with Frankey Poot, pt's husband (ok per dpr). I advised of recent Carotid US results and he verbalized understanding. He verbalized understanding and had no further questions at this time. He states he will advise pt of results once she returns home.

## 2018-12-14 NOTE — Telephone Encounter (Signed)
-----   Message from Larey Seat, MD sent at 12/14/2018  8:32 AM EST ----- Acc. To Dr Leonie Man- Minor stenosis would not be needing intervention.

## 2018-12-14 NOTE — Telephone Encounter (Signed)
I called the patient.  The carotid Doppler study was unremarkable, no source of stroke was noted.    Carotid doppler 12/12/18:  Summary: Right Carotid: Velocities in the right ICA are consistent with a 1-39% stenosis.  Left Carotid: Velocities in the left ICA are consistent with a 1-39% stenosis.  Vertebrals:  Bilateral vertebral arteries demonstrate antegrade flow. Subclavians: Normal flow hemodynamics were seen in bilateral subclavian              arteries.

## 2018-12-14 NOTE — Telephone Encounter (Signed)
Pt has returned call to [place], please call

## 2019-01-11 ENCOUNTER — Other Ambulatory Visit: Payer: Self-pay | Admitting: Neurology

## 2019-01-11 ENCOUNTER — Ambulatory Visit: Payer: Medicare Other | Admitting: Neurology

## 2019-01-15 ENCOUNTER — Emergency Department (HOSPITAL_BASED_OUTPATIENT_CLINIC_OR_DEPARTMENT_OTHER): Payer: Medicare Other

## 2019-01-15 ENCOUNTER — Other Ambulatory Visit: Payer: Self-pay

## 2019-01-15 ENCOUNTER — Emergency Department (HOSPITAL_BASED_OUTPATIENT_CLINIC_OR_DEPARTMENT_OTHER)
Admission: EM | Admit: 2019-01-15 | Discharge: 2019-01-15 | Disposition: A | Discharge status: Home / Self Care | Payer: Medicare Other | Attending: Emergency Medicine | Admitting: Emergency Medicine

## 2019-01-15 ENCOUNTER — Encounter (HOSPITAL_BASED_OUTPATIENT_CLINIC_OR_DEPARTMENT_OTHER): Payer: Self-pay | Admitting: Emergency Medicine

## 2019-01-15 DIAGNOSIS — S60812A Abrasion of left wrist, initial encounter: Secondary | ICD-10-CM | POA: Diagnosis not present

## 2019-01-15 DIAGNOSIS — S52622A Torus fracture of lower end of left ulna, initial encounter for closed fracture: Secondary | ICD-10-CM | POA: Diagnosis not present

## 2019-01-15 DIAGNOSIS — Z880 Allergy status to penicillin: Secondary | ICD-10-CM | POA: Diagnosis not present

## 2019-01-15 DIAGNOSIS — S6992XA Unspecified injury of left wrist, hand and finger(s), initial encounter: Secondary | ICD-10-CM | POA: Diagnosis present

## 2019-01-15 DIAGNOSIS — W19XXXA Unspecified fall, initial encounter: Secondary | ICD-10-CM

## 2019-01-15 DIAGNOSIS — Z79899 Other long term (current) drug therapy: Secondary | ICD-10-CM | POA: Diagnosis not present

## 2019-01-15 DIAGNOSIS — Y9301 Activity, walking, marching and hiking: Secondary | ICD-10-CM | POA: Insufficient documentation

## 2019-01-15 DIAGNOSIS — I10 Essential (primary) hypertension: Secondary | ICD-10-CM | POA: Diagnosis not present

## 2019-01-15 DIAGNOSIS — W010XXA Fall on same level from slipping, tripping and stumbling without subsequent striking against object, initial encounter: Secondary | ICD-10-CM | POA: Insufficient documentation

## 2019-01-15 DIAGNOSIS — Z87891 Personal history of nicotine dependence: Secondary | ICD-10-CM | POA: Diagnosis not present

## 2019-01-15 DIAGNOSIS — Y929 Unspecified place or not applicable: Secondary | ICD-10-CM | POA: Insufficient documentation

## 2019-01-15 DIAGNOSIS — Y999 Unspecified external cause status: Secondary | ICD-10-CM | POA: Insufficient documentation

## 2019-01-15 DIAGNOSIS — Z23 Encounter for immunization: Secondary | ICD-10-CM | POA: Insufficient documentation

## 2019-01-15 DIAGNOSIS — T148XXA Other injury of unspecified body region, initial encounter: Secondary | ICD-10-CM

## 2019-01-15 MED ORDER — HYDROCODONE-ACETAMINOPHEN 5-325 MG PO TABS
1.0000 | ORAL_TABLET | Freq: Once | ORAL | Status: AC
  Administered 2019-01-15: 16:00:00 1 via ORAL
  Filled 2019-01-15: qty 1

## 2019-01-15 MED ORDER — TETANUS-DIPHTH-ACELL PERTUSSIS 5-2.5-18.5 LF-MCG/0.5 IM SUSP
0.5000 mL | Freq: Once | INTRAMUSCULAR | Status: AC
  Administered 2019-01-15: 0.5 mL via INTRAMUSCULAR
  Filled 2019-01-15: qty 0.5

## 2019-01-15 NOTE — ED Provider Notes (Signed)
[facility] EMERGENCY DEPARTMENT Provider Note   CSN: [id number] Arrival date & time: 01/15/19  1556    History   Chief Complaint Chief Complaint  Patient presents with   Fall    HPI [name] is a 78 y.o. female who presents to the ED complaining of left wrist pain and right knee pain s/p ground level fall that occurred about 2.5 hours ago. Pt reports she was walking when she tripped and fell; landing onto her wrist and knee. No head injury or LOC. Pt has a skin tear to her left wrist which she cleaned and trimmed the skin back before putting a bandaid on it. Not UTD on tetanus. Denies shortness of breath, chest pain, dizziness, lightheadedness prior to fall. No other complaints at this time. Pt is not anticoagulated.       Past Medical History:  Diagnosis Date   Abnormality of gait 04/17/2013   Cervical spondylosis    Cervicogenic headache    Right occipital neuralgia   Depression    Dyslipidemia    Gait disorder    Hyperlipidemia    Hypertension    Normal pressure hydrocephalus (HCC)    Occipital neuralgia 04/17/2013   right   Osteomyelitis of left knee region [facility])    Rheumatoid arthritis(714.0)    Vertigo 02/19/2015    Patient Active Problem List   Diagnosis Date Noted   Normal pressure hydrocephalus ([place]) 02/19/2015   Vertigo 02/19/2015   Peripheral neuropathy 11/20/2013   Abnormality of gait 04/17/2013   Occipital neuralgia 04/17/2013   Headache 10/17/2012   Cervical spondylosis without myelopathy 10/17/2012   Osteomyelitis of left knee region [facility]) 02/27/2011    Past Surgical History:  Procedure Laterality Date   CERVICAL SPINE SURGERY     JOINT REPLACEMENT     LUMBAR SPINE SURGERY     OVARY SURGERY     REPLACEMENT TOTAL KNEE Left    VENTRICULO-PERITONEAL SHUNT PLACEMENT / [place]       OB History   No obstetric history on file.      Home Medications    Prior to  Admission medications   Medication Sig Start Date End Date Taking? Authorizing Provider  acetaminophen (TYLENOL) 500 MG tablet Take 1,000 mg by mouth 3 (three) times daily as needed for mild pain.     [provider]  amLODipine (NORVASC) 5 MG tablet Take 5 mg by mouth daily.      [provider]  Calcium-Vitamin D-Vitamin K (CALCIUM SOFT CHEWS PO) Take by mouth daily.    [provider]  labetalol (NORMODYNE) 100 MG tablet Take 50 mg by mouth twice daily     [provider]  LATANOPROST OP Place 1 Dose into both eyes at bedtime. 08/02/17   [provider]  leflunomide (ARAVA) 10 MG tablet Take 10 mg by mouth daily.    [provider]  meclizine (ANTIVERT) 25 MG tablet Take 1 tablet (25 mg total) by mouth 3 (three) times daily as needed for dizziness. 02/18/15   Quintella Reichert, MD  methocarbamol (ROBAXIN-750) 750 MG tablet Take 1 tablet (750 mg total) by mouth every 8 (eight) hours as needed for muscle spasms. 07/05/18   Kathrynn Ducking, MD  Multiple Vitamin (MULTIVITAMIN) tablet Take 1 tablet by mouth daily.    [provider]  ondansetron (ZOFRAN) 4 MG tablet Take 1 tablet (4 mg total) by mouth every 6 (six) hours. Patient taking differently: Take 4 mg by mouth  as needed.  02/18/15   Quintella Reichert, MD  predniSONE (DELTASONE) 1 MG tablet Take 2 mg by mouth daily.     [provider]  traZODone (DESYREL) 50 MG tablet TAKE 1 TABLET BY MOUTH AT BEDTIME. GENERIC EQUIVALENT FOR DESYREL Patient taking differently: 25 mg.  05/16/18   Kathrynn Ducking, MD  traZODone (DESYREL) 50 MG tablet TAKE 1 TABLET BY MOUTH AT BEDTIME. GENERIC EQUIVALENT FOR DESYREL 09/06/18   Kathrynn Ducking, MD  traZODone (DESYREL) 50 MG tablet TAKE 1 TABLET BY MOUTH AT BEDTIME. GENERIC EQUIVALENT FOR DESYREL 01/11/19   Kathrynn Ducking, MD    Family History Family History  Problem Relation Age of Onset   Ovarian cancer Mother    Heart disease Father      Brain cancer Sister     Social History Social History   Tobacco Use   Smoking status: Former Smoker   Smokeless tobacco: Never Used  Substance Use Topics   Alcohol use: No   Drug use: No     Allergies   Methotrexate derivatives; Penicillins; and Sulfa antibiotics   Review of Systems Review of Systems  Constitutional: Negative for fever.  HENT: Negative for ear pain.   Eyes: Negative for visual disturbance.  Respiratory: Negative for shortness of breath.   Cardiovascular: Negative for chest pain.  Gastrointestinal: Negative for abdominal pain, nausea and vomiting.  Musculoskeletal: Positive for arthralgias.  Skin: Positive for wound.  Neurological: Negative for headaches.  Hematological: Does not bruise/bleed easily.     Physical Exam Updated Vital Signs BP (!) 198/91 (BP Location: Right Arm)    Pulse 68    Temp 97.8 F (36.6 C) (Oral)    Resp 18    Ht 5\' 7"  (1.702 m)    Wt 68 kg    SpO2 98%    BMI 23.49 kg/m   Physical Exam Vitals signs and nursing note reviewed.  Constitutional:      Appearance: She is not ill-appearing.  HENT:     Head: Normocephalic and atraumatic.  Eyes:     Conjunctiva/sclera: Conjunctivae normal.  Neck:     Musculoskeletal: Normal range of motion and neck supple.  Cardiovascular:     Rate and Rhythm: Normal rate and regular rhythm.     Pulses: Normal pulses.     Heart sounds: No murmur.  Pulmonary:     Effort: Pulmonary effort is normal.     Breath sounds: Normal breath sounds. No wheezing, rhonchi or rales.  Abdominal:     General: Abdomen is flat.     Palpations: Abdomen is soft.     Tenderness: There is no abdominal tenderness.  Musculoskeletal: Normal range of motion.     Comments: 2 x 4 cm skin tear to medial left wrist with tenderness to palpation. No ecchymosis. Baseline ROM intact; pt has limited ROM due to rheumatoid arthritis. Grip Strength 5/5 bilaterally. 2+ radial pulses. No tenderness to elbow or shoulder on  left.  No deformity or ecchymosis to right knee. Full ROM intact. Mild tenderness to anterior aspect of knee. No posterior tenderness. Negative anterior and posterior drawer test. Negative McMurrays. No varus or valgus laxity. 2+ DP pulses.   Skin:    General: Skin is warm and dry.  Neurological:     Mental Status: She is alert.      ED Treatments / Results  Labs (all labs ordered are listed, but only abnormal results are displayed) Labs Reviewed - No data to display  EKG None  Radiology Dg Wrist Complete Left  Result Date: 01/15/2019 CLINICAL DATA:  Fall today with left wrist pain. EXAM: LEFT WRIST - COMPLETE 3+ VIEW COMPARISON:  None. FINDINGS: Exam demonstrates moderate degenerative change over the radiocarpal joint, distal radioulnar joint and carpal bones. There is degenerative change of the first carpometacarpal joint. There is a displaced slightly comminuted fracture of the distal ulnar diaphysis extending into the metaphyseal region. An impacted distal radial metaphyseal fracture is possible with volar angulation of the distal fragment as this is difficult to assess due to the significant degenerative changes present. IMPRESSION: Oblique mildly comminuted displaced fracture of the distal ulnar diaphysis extending into the metaphysis. Possible impaction fracture of the distal radial metaphysis with mild volar angulation of the distal fragment as this is difficult to evaluate due to the significant degenerative changes present. Electronically Signed   By: Marin Olp M.D.   On: 01/15/2019 17:02   Dg Wrist Complete Right  Result Date: 01/15/2019 CLINICAL DATA:  78 year old female with a history of fall and hand injury EXAM: RIGHT WRIST - COMPLETE 3+ VIEW COMPARISON:  No prior for comparison FINDINGS: Osteopenia. No displaced fracture identified. Disorganized arrangement of the carpal bones with loss of carpal joint space loss of the joint space at the radiocarpal and ulnar carpal  interval. Joint space narrowing at the first carpometacarpal joint with eburnation on both sides of the joint and marginal osteophyte formation. No focal soft tissue swelling or radiopaque foreign body. Partial ankylosis at the second and third carpometacarpal joints on the oblique image. IMPRESSION: Advanced chronic changes at the right wrist, with no evidence of acute fracture. If there is ongoing concern for occult fracture, MRI may be useful. Electronically Signed   By: Corrie Mckusick D.O.   On: 01/15/2019 17:36   Dg Knee Complete 4 Views Right  Result Date: 01/15/2019 CLINICAL DATA:  Fall today landing on right knee with resulting pain. EXAM: RIGHT KNEE - COMPLETE 4+ VIEW COMPARISON:  None. FINDINGS: There is mild-to-moderate tricompartmental osteoarthritic change most prominent over the patellofemoral joint. No acute fracture or dislocation. No significant joint effusion. IMPRESSION: No acute findings. 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 Electronically Signed   By: Marin Olp M.D.   On: 01/15/2019 16:58    Procedures Procedures (including critical care time)  Medications Ordered in ED Medications  Tdap (BOOSTRIX) injection 0.5 mL (0.5 mLs Intramuscular Given 01/15/19 1622)  HYDROcodone-acetaminophen (NORCO/VICODIN) 5-325 MG per tablet 1 tablet (1 tablet Oral Given 01/15/19 1625)     Initial Impression / Assessment and Plan / ED Course  I have reviewed the triage vital signs and the nursing notes.  Pertinent labs & imaging results that were available during my care of the patient were reviewed by me and considered in my medical decision making (see chart for details).    Left wrist pain and right knee pain s/p ground level fall. Small skin tear to left wrist; not UTD on tetanus. Will update tetanus in the ED. X rays ordered for left wrist and right knee. No head injury or LOC. Pt not anticoagulated. Do not feel pt needs any other imaging including head CT at  this time. Will reevaluate once images return.   DG R Knee without fracture. DG L wrist with comminuted fracture of ulna and possible impaction fracture of distal radial metaphysis although radiologist reports difficulty in determining this due to degenerative changes; Attending physician Dr. Tyrone Nine evaluated pt to determine if pt is having  any pain to wrist; no pain over wrist directly; able to move wrist similar to right wrist; will get x ray of right wrist to compare if these changes are due to rheumatoid arthritis or if she needs reduction of left wrist.   X ray appears similar to left wrist x ray; likely changes from her RA. Will apply sugar tong splint as well as non adhesive dressing to skin tear. Pt to follow up with her orthopedist Dr. Wynelle Link tomorrow AM regarding ED visit. She is in agreement with plan at this time and stable for discharge home. Pt initially hypertensive in the ED at 198/91; has since improved. Likely elevated due to pain.   Vitals:   01/15/19 1603 01/15/19 1605 01/15/19 1809  BP:  (!) 198/91 (!) 150/61  Pulse:  68 64  Resp:  18 18  Temp:  97.8 F (36.6 C)   TempSrc:  Oral   SpO2:  98% 98%  Weight: 68 kg    Height: 5\' 7"  (1.702 m)            Final Clinical Impressions(s) / ED Diagnoses   Final diagnoses:  Fall, initial encounter  Closed torus fracture of distal end of left ulna, initial encounter  Skin abrasion    ED Discharge Orders    None       Eustaquio Maize, PA-C 01/15/19 2054    Deno Etienne, DO 01/15/19 2246

## 2019-01-15 NOTE — ED Notes (Signed)
ED Provider at bedside. 

## 2019-01-15 NOTE — ED Triage Notes (Signed)
Pt tripped and fell while going for a walk today. C/o L wrist and R knee pain. Denies LOC.

## 2019-01-15 NOTE — ED Notes (Signed)
Pt verbalized understanding of discharge instructions.

## 2019-01-15 NOTE — Discharge Instructions (Signed)
You were seen in the ED today after falling at home and found to have a distal ulnar fracture of your left arm. You were placed in a splint in the ED. Please follow up with orthopedist Dr. Wynelle Link tomorrow; you should call his office and let him know you were in the ED today. You may take Tylenol or Ibuprofen as needed for pain. Continue to keep your wound clean and dry; you may apply bacitracin (neosporin) to the area. Return to the ED for any redness around the area, swelling, drainage of pus, or fever.

## 2019-01-15 NOTE — ED Notes (Signed)
Patient transported to X-ray 

## 2019-07-05 ENCOUNTER — Other Ambulatory Visit: Payer: Self-pay | Admitting: Neurology

## 2019-07-25 IMAGING — DX RIGHT WRIST - COMPLETE 3+ VIEW
4 series · 4 of 4 positions shown · non-contrast
Comparison: No prior for comparison

CLINICAL DATA: 77-year-old female with a history of fall and hand
injury

EXAM:
RIGHT WRIST - COMPLETE 3+ VIEW

[wrist pa]
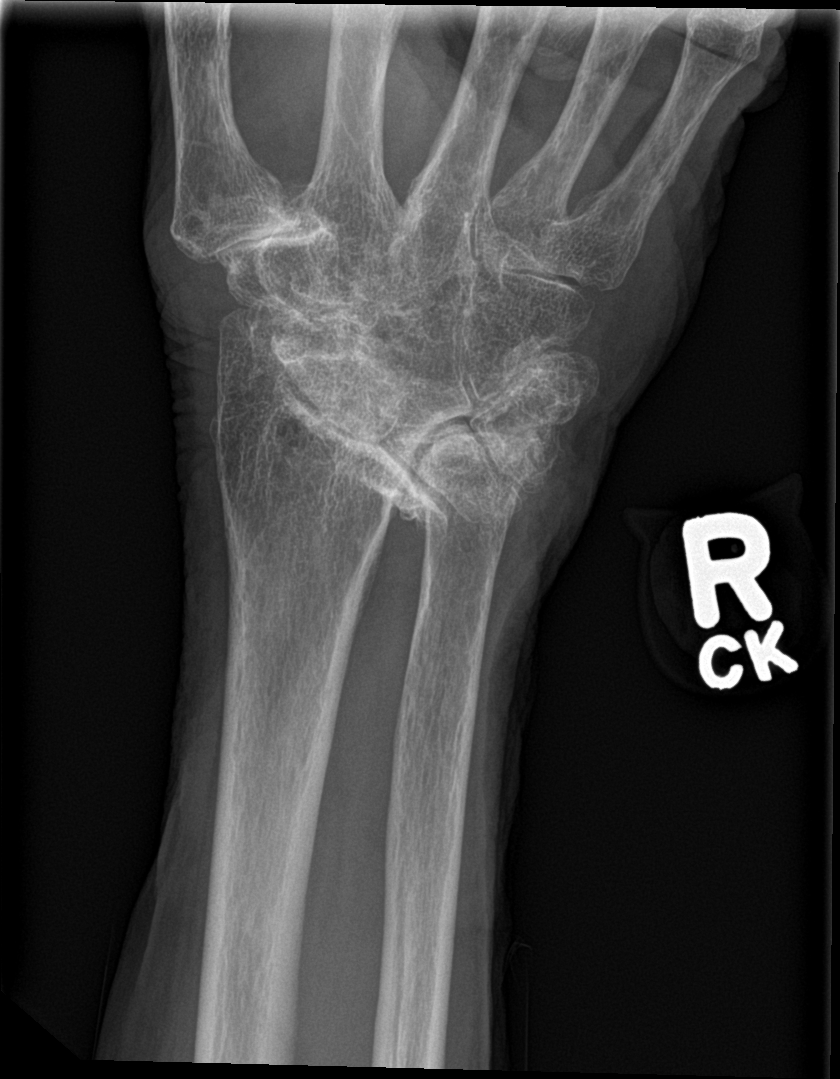

[wrist obl]
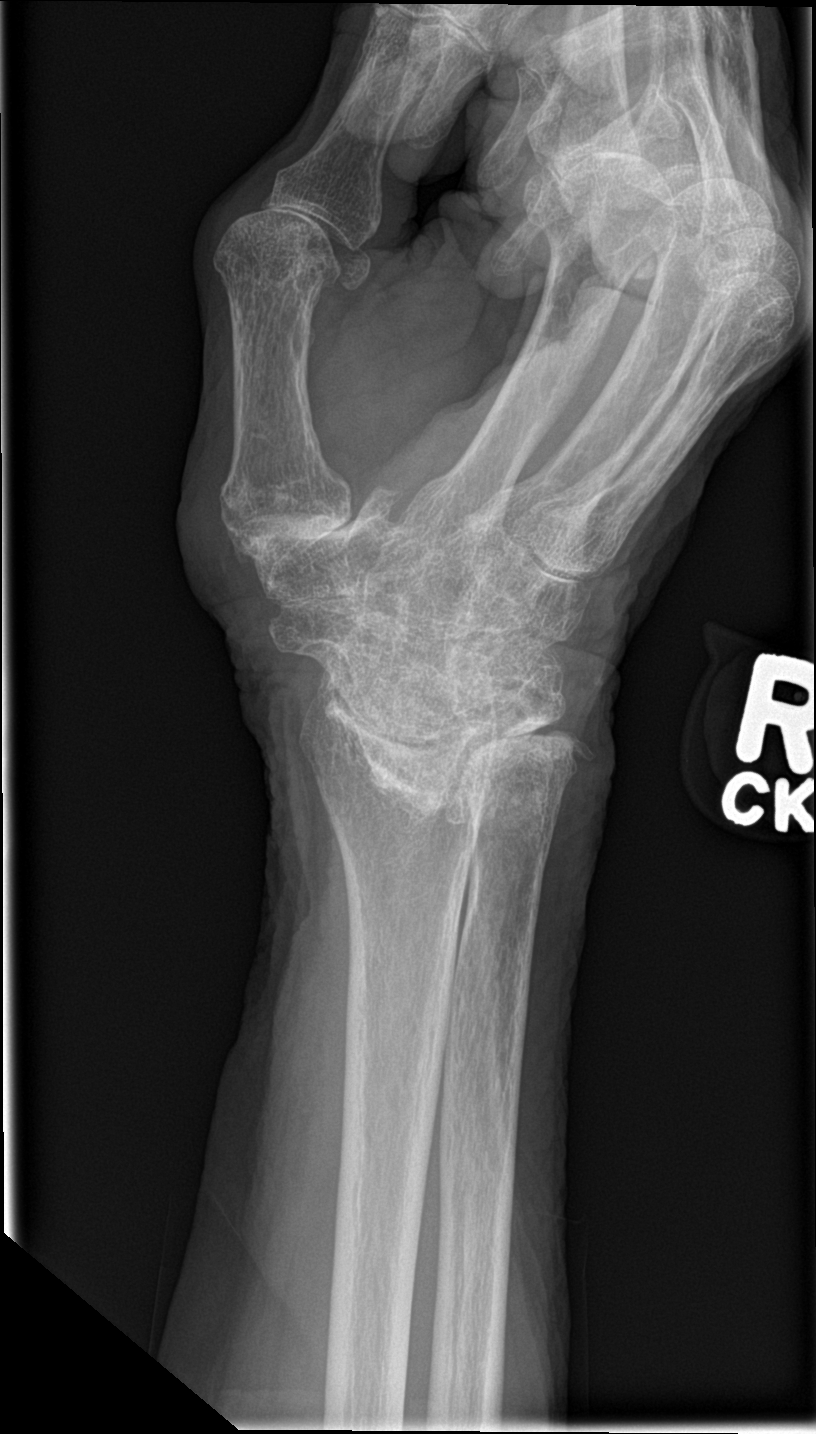

[wrist lat]
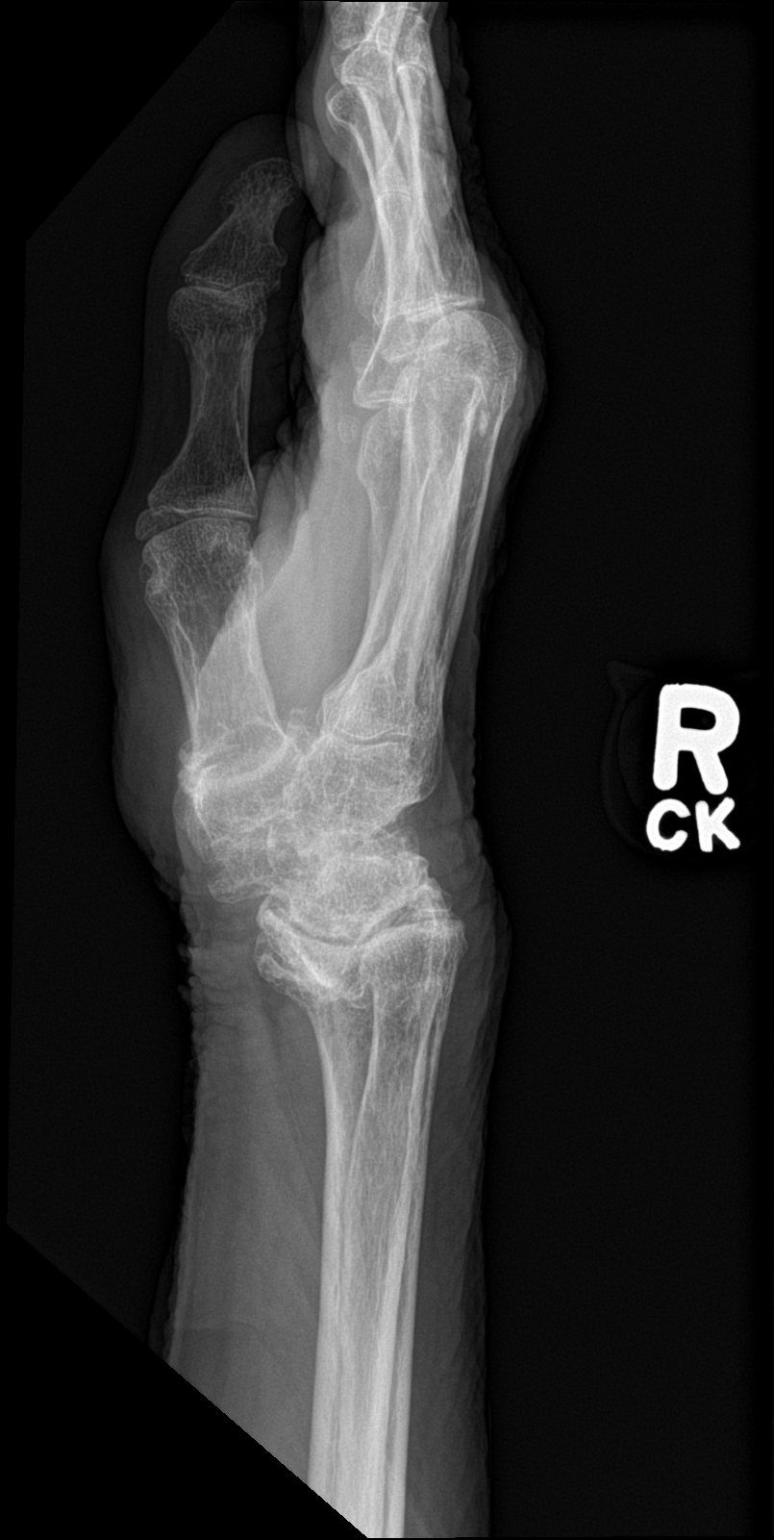

[wrist navicular]
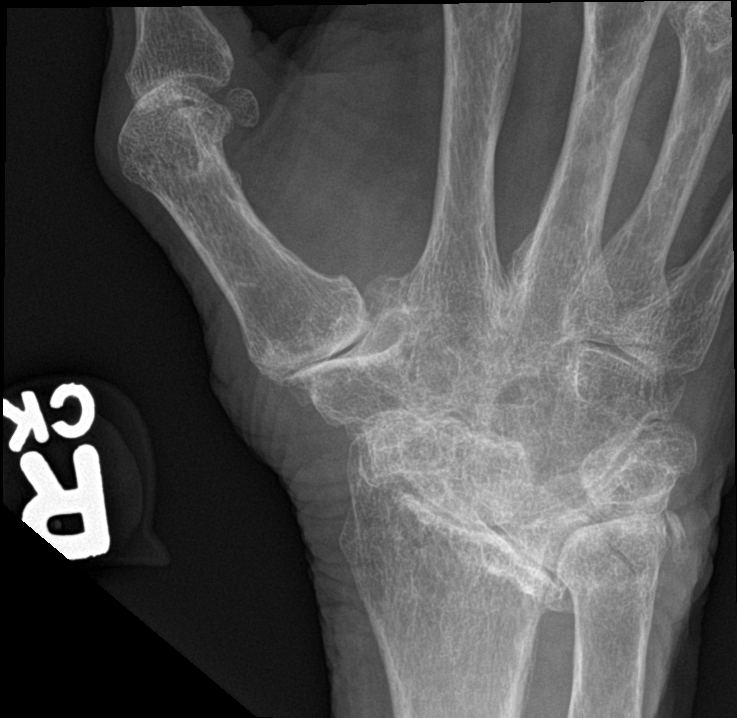

[4 of 4 positions shown; findings below may reference images not displayed]

FINDINGS: Osteopenia.

No displaced fracture identified.

Disorganized arrangement of the carpal bones with loss of carpal
joint space loss of the joint space at the radiocarpal and ulnar
carpal interval. Joint space narrowing at the first carpometacarpal
joint with eburnation on both sides of the joint and marginal
osteophyte formation.

No focal soft tissue swelling or radiopaque foreign body.

Partial ankylosis at the second and third carpometacarpal joints on
the oblique image.
IMPRESSION: Advanced chronic changes at the right wrist, with no evidence of
acute fracture. If there is ongoing concern for occult fracture, MRI
may be useful.

## 2019-08-09 ENCOUNTER — Encounter

## 2019-08-09 ENCOUNTER — Encounter: Payer: Self-pay | Admitting: Neurology

## 2019-08-09 ENCOUNTER — Ambulatory Visit (INDEPENDENT_AMBULATORY_CARE_PROVIDER_SITE_OTHER): Payer: Medicare Other | Admitting: Neurology

## 2019-08-09 ENCOUNTER — Other Ambulatory Visit: Payer: Self-pay

## 2019-08-09 VITALS — BP 151/87 | HR 71 | Temp 97.4°F | Ht 67.0 in | Wt 157.0 lb

## 2019-08-09 DIAGNOSIS — R269 Unspecified abnormalities of gait and mobility: Secondary | ICD-10-CM | POA: Diagnosis not present

## 2019-08-09 DIAGNOSIS — I63032 Cerebral infarction due to thrombosis of left carotid artery: Secondary | ICD-10-CM

## 2019-08-09 DIAGNOSIS — G912 (Idiopathic) normal pressure hydrocephalus: Secondary | ICD-10-CM

## 2019-08-09 DIAGNOSIS — M47812 Spondylosis without myelopathy or radiculopathy, cervical region: Secondary | ICD-10-CM

## 2019-08-09 NOTE — Progress Notes (Signed)
Reason for visit: Gait disorder  [name] is an 78 y.o. female  History of present illness:  [name] is a 78 year old right-handed white female with a history of rheumatoid arthritis, cervical myelopathy, normal pressure hydrocephalus, status post VP shunt placement, peripheral neuropathy, left total knee replacement, leg length discrepancy, and a left foot drop.  The patient fell in April 2020 fracturing the left wrist.  She fell while using the walker.  She tries to exercise on a regular basis, she will go to the swimming pool 3 times a week and then exercise on the days in between.  She currently is in physical therapy.  She has weakness with hip flexion on the left combined with a left foot drop increasing the chance of catching her left toe with ambulation.  The patient reports no pain or discomfort currently.  Recent EMG and nerve conduction study showed progressive worsening of the peripheral neuropathy over the last 5 years with a severe left peroneal neuropathy that correlates with the foot drop.  The patient returns to the office today for an evaluation.  Past Medical History:  Diagnosis Date  . Abnormality of gait 04/17/2013  . Cervical spondylosis   . Cervicogenic headache    Right occipital neuralgia  . Depression   . Dyslipidemia   . Gait disorder   . Hyperlipidemia   . Hypertension   . Normal pressure hydrocephalus (HCC)   . Occipital neuralgia 04/17/2013   right  . Osteomyelitis of left knee region ([place])   . Rheumatoid arthritis(714.0)   . Vertigo 02/19/2015    Past Surgical History:  Procedure Laterality Date  . CERVICAL SPINE SURGERY    . JOINT REPLACEMENT    . LUMBAR SPINE SURGERY    . OVARY SURGERY    . REPLACEMENT TOTAL KNEE Left   . VENTRICULO-PERITONEAL SHUNT PLACEMENT / LAPAROSCOPIC INSERTION PERITONEAL CATHETER      Family History  Problem Relation Age of Onset  . Ovarian cancer Mother   . Heart disease Father   . Brain cancer Sister      Social history:  reports that she has quit smoking. She has never used smokeless tobacco. She reports that she does not drink alcohol or use drugs.    Allergies  Allergen Reactions  . Methotrexate Derivatives Other (See Comments)    Dangerously decreased platelet count  . Penicillins Itching  . Sulfa Antibiotics Itching    Medications:  Prior to Admission medications   Medication Sig Start Date End Date Taking? Authorizing Provider  acetaminophen (TYLENOL) 500 MG tablet Take 1,000 mg by mouth 3 (three) times daily as needed for mild pain.    Yes [provider]  amLODipine (NORVASC) 5 MG tablet Take 5 mg by mouth daily.     Yes [provider]  Calcium-Vitamin D-Vitamin K (CALCIUM SOFT CHEWS PO) Take by mouth daily.   Yes [provider]  labetalol (NORMODYNE) 100 MG tablet Take 50 mg by mouth twice daily    Yes [provider]  LATANOPROST OP Place 1 Dose into both eyes at bedtime. 08/02/17  Yes [provider]  leflunomide (ARAVA) 10 MG tablet Take 10 mg by mouth daily.   Yes [provider]  meclizine (ANTIVERT) 25 MG tablet Take 1 tablet (25 mg total) by mouth 3 (three) times daily as needed for dizziness. 02/18/15  Yes Quintella Reichert, MD  methocarbamol (ROBAXIN-750) 750 MG tablet Take 1 tablet (750 mg total) by mouth every 8 (eight)  hours as needed for muscle spasms. 07/05/18  Yes Kathrynn Ducking, MD  Multiple Vitamin (MULTIVITAMIN) tablet Take 1 tablet by mouth daily.   Yes [provider]  ondansetron (ZOFRAN) 4 MG tablet Take 1 tablet (4 mg total) by mouth every 6 (six) hours. Patient taking differently: Take 4 mg by mouth as needed.  02/18/15  Yes Quintella Reichert, MD  predniSONE (DELTASONE) 1 MG tablet Take 2 mg by mouth daily.    Yes [provider]  traZODone (DESYREL) 50 MG tablet TAKE 1 TABLET BY MOUTH AT BEDTIME. GENERIC EQUIVALENT FOR DESYREL Patient taking differently: 25 mg.  05/16/18  Yes Kathrynn Ducking, MD    ROS:  Out of a complete 14 system review of symptoms, the patient complains only of the following symptoms, and all other reviewed systems are negative.  Walking difficulty Fatigue  Blood pressure (!) 151/87, pulse 71, temperature (!) 97.4 F (36.3 C), temperature source Temporal, height 5\' 7"  (1.702 m), weight 157 lb (71.2 kg).  Physical Exam  General: The patient is alert and cooperative at the time of the examination.  Skin: No significant peripheral edema is noted.   Neurologic Exam  Mental status: The patient is alert and oriented x 3 at the time of the examination. The patient has apparent normal recent and remote memory, with an apparently normal attention span and concentration ability.   Cranial nerves: Facial symmetry is present. Speech is normal, no aphasia or dysarthria is noted. Extraocular movements are full. Visual fields are full.  Motor: The patient has good strength in all 4 extremities, with exception of 3/5 strength with hip flexion on the left and a mild left foot drop.  Sensory examination: Soft touch sensation is symmetric on the face, arms, and legs.  Coordination: The patient has good finger-nose-finger and heel-to-shin bilaterally, with exception that she has difficulty performing heel-to-shin on the left.  Gait and station: The patient has a slightly wide-based gait, she can walk with a walker.  Tandem gait was not attempted.  Romberg is unsteady but the patient does not fall.  Reflexes: Deep tendon reflexes are symmetric.   Assessment/Plan:  1.  Gait disorder, multifactorial  2.  Left foot drop  3.  Peripheral neuropathy  4.  Normal pressure hydrocephalus  5.  History of cervical myelopathy  The patient indicates that she is having increasing fatigue with walking, she is not able to walk as long as she used to be able to.  This may correlate with the progression of the peripheral neuropathy.  She currently is in physical therapy.   I have recommended that she wear at least an ankle support brace, she may benefit from an AFO brace in the future on the left.  She has combination of hip flexion weakness and a foot drop on the left putting her at risk for catching her toe.  She will follow-up in 8 months.  Jill Alexanders MD 08/09/2019 9:36 AM  [facility] [address] [place], [place] [address]  Phone [phone] Fax [phone]

## 2019-11-07 ENCOUNTER — Other Ambulatory Visit: Payer: Self-pay

## 2019-11-07 MED ORDER — METHOCARBAMOL 750 MG PO TABS: 750.0000 mg | ORAL_TABLET | Freq: Three times a day (TID) | ORAL | 3 refills | Status: DC | PRN

## 2019-12-15 ENCOUNTER — Other Ambulatory Visit: Payer: Self-pay | Admitting: Neurology

## 2019-12-18 ENCOUNTER — Other Ambulatory Visit: Payer: Self-pay

## 2019-12-18 MED ORDER — TRAZODONE HCL 50 MG PO TABS: ORAL_TABLET | ORAL | 3 refills | Status: DC

## 2020-04-17 ENCOUNTER — Encounter: Payer: Self-pay | Admitting: Neurology

## 2020-04-17 ENCOUNTER — Other Ambulatory Visit: Payer: Self-pay

## 2020-04-17 ENCOUNTER — Ambulatory Visit (INDEPENDENT_AMBULATORY_CARE_PROVIDER_SITE_OTHER): Payer: Medicare Other | Admitting: Neurology

## 2020-04-17 VITALS — BP 130/69 | HR 71 | Ht 66.0 in | Wt 158.5 lb

## 2020-04-17 DIAGNOSIS — R269 Unspecified abnormalities of gait and mobility: Secondary | ICD-10-CM

## 2020-04-17 DIAGNOSIS — G609 Hereditary and idiopathic neuropathy, unspecified: Secondary | ICD-10-CM | POA: Diagnosis not present

## 2020-04-17 DIAGNOSIS — G912 (Idiopathic) normal pressure hydrocephalus: Secondary | ICD-10-CM | POA: Diagnosis not present

## 2020-04-17 NOTE — Progress Notes (Signed)
Reason for visit: Gait disorder  [name] is an 79 y.o. 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  The patient has significant arthritis of the right knee and will be having a total knee replacement in the next 2 weeks.  She is trying to do strengthening exercises on a regular basis.  She is sleeping well at night, she uses trazodone.  She has significant knee pain with walking, she uses a walker for ambulation.  She has weakness with hip flexion on the left that is chronic in nature.  The combination of hip flexion and the left-sided foot drop allows her to drag the left foot at times.  She has not had any falls since last seen.  Past Medical History:  Diagnosis Date  . Abnormality of gait 04/17/2013  . Cervical spondylosis   . Cervicogenic headache    Right occipital neuralgia  . Depression   . Dyslipidemia   . Gait disorder   . Hyperlipidemia   . Hypertension   . Normal pressure hydrocephalus (HCC)   . Occipital neuralgia 04/17/2013   right  . 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 Rheumatoid arthritis(714.0)   . Vertigo 02/19/2015    Past Surgical History:  Procedure Laterality Date  . CERVICAL SPINE SURGERY    . JOINT REPLACEMENT    . LUMBAR SPINE SURGERY    . OVARY SURGERY    . REPLACEMENT TOTAL KNEE Left   . VENTRICULO-PERITONEAL SHUNT PLACEMENT / LAPAROSCOPIC INSERTION PERITONEAL CATHETER      Family History  Problem Relation Age of Onset  . Ovarian cancer Mother   . Heart disease Father   . Brain cancer Sister     Social history:  reports that she has quit smoking. She has never used smokeless tobacco. She reports that she does not drink alcohol and does not use drugs.    Allergies    Allergen Reactions  . Methotrexate Derivatives Other (See Comments)    Dangerously decreased platelet count  . Penicillins Itching  . Sulfa Antibiotics Itching    Medications:  Prior to Admission medications   Medication Sig Start Date End Date Taking? Authorizing Provider  acetaminophen (TYLENOL) 500 MG tablet Take 1,000 mg by mouth 3 (three) times daily as needed for mild pain.    Yes [provider]  acetaminophen-codeine (TYLENOL #3) 300-30 MG tablet Take 1 tablet by mouth 3 (three) times daily as needed for pain. 04/01/20  Yes [provider]  alendronate (FOSAMAX) 70 MG tablet Take 70 mg by mouth once a week. 01/12/20  Yes [provider]  amLODipine (NORVASC) 5 MG tablet Take 5 mg by mouth daily.     Yes [provider]  Calcium-Vitamin D-Vitamin K (CALCIUM SOFT CHEWS PO) Take by mouth daily.    Yes [provider]  labetalol (NORMODYNE) 100 MG tablet Take 100 mg by mouth 2 (two) times daily.    Yes [provider]  latanoprost (XALATAN) 0.005 % ophthalmic solution Place 1 drop into both eyes at bedtime.  08/02/17  Yes [provider]  leflunomide (ARAVA) 10 MG tablet Take 10 mg by mouth daily.   Yes [provider]  meclizine (ANTIVERT) 25 MG tablet Take 1 tablet (25 mg total) by mouth 3 (  three) times daily as needed for dizziness. 02/18/15  Yes Margaret Reichert, MD  methocarbamol (ROBAXIN-750) 750 MG tablet Take 1 tablet (750 mg total) by mouth every 8 (eight) hours as needed for muscle spasms. 11/07/19  Yes Margaret Ducking, MD  Multiple Vitamin (MULTIVITAMIN) tablet Take 1 tablet by mouth daily.   Yes [provider]  ondansetron (ZOFRAN) 4 MG tablet Take 1 tablet (4 mg total) by mouth every 6 (six) hours. Patient taking differently: Take 4 mg by mouth every 8 (eight) hours as needed for nausea or vomiting.  02/18/15  Yes Margaret Reichert, MD  predniSONE (DELTASONE) 1 MG tablet Take 4 mg by mouth daily.    Yes  [provider]  traZODone (DESYREL) 50 MG tablet TAKE 1 TABLET BY MOUTH AT BEDTIME. GENERIC EQUIVALENT FOR DESYREL Patient taking differently: Take 50 mg by mouth at bedtime.  12/18/19  Yes Margaret Ducking, MD    ROS:  Out of a complete 14 system review of symptoms, the patient complains only of the following symptoms, and all other reviewed systems are negative.  Leg weakness Walking difficulty Knee pain  Blood pressure 130/69, pulse 71, height 5\' 6"  (1.676 m), weight 158 lb 8 oz (71.9 kg).  Physical Exam  General: The patient is alert and cooperative at the time of the examination.  Skin: No significant peripheral edema is noted.   Neurologic Exam  Mental status: The patient is alert and oriented x 3 at the time of the examination. The patient has apparent normal recent and remote memory, with an apparently normal attention span and concentration ability.   Cranial nerves: Facial symmetry is present. Speech is normal, no aphasia or dysarthria is noted. Extraocular movements are full. Visual fields are full.  Motor: The patient has good strength in all 4 extremities, with exception of significant weakness with hip flexion on the left and a mild left foot drop.  Sensory examination: Soft touch sensation is symmetric on the face, arms, and legs.  Coordination: The patient has good finger-nose-finger bilaterally.  The patient has difficulty performing heel-to-shin with the left leg, can perform on the right.  Gait and station: The patient walks with a walker, stance is slightly wide-based.  She will have a tendency to drag the toe on the left foot at times.  Reflexes: Deep tendon reflexes are symmetric.   Assessment/Plan:  1.  Multifactorial gait disorder  The patient will be undergoing right knee surgery in the near future which may help her ability to walk some.  She has a combination of left hip flexion weakness and a left foot drop.  It is possible that an AFO  brace or an ankle support brace may help.  The patient reports that she does not have significant issues with dragging the left toe, she has not had any recent falls.  The patient will follow up here in 8 months.  Jill Alexanders MD 04/17/2020 12:29 PM  [facility] [address] [place], [place] [address]  Phone [phone] Fax [phone]

## 2020-04-22 NOTE — Patient Instructions (Addendum)
DUE TO COVID-19 ONLY ONE VISITOR IS ALLOWED TO COME WITH YOU AND STAY IN THE WAITING ROOM ONLY DURING PRE OP AND PROCEDURE DAY OF SURGERY. THE 1 VISITOR MAY VISIT WITH YOU AFTER SURGERY IN YOUR PRIVATE ROOM DURING VISITING HOURS ONLY!  YOU NEED TO HAVE A COVID 19 TEST ON: 04/25/20 @ 1:00 pm, THIS TEST MUST BE DONE BEFORE SURGERY, COME  [place], [place] , [id number].  ([place]) ONCE YOUR COVID TEST IS COMPLETED, PLEASE BEGIN THE QUARANTINE INSTRUCTIONS AS OUTLINED IN YOUR HANDOUT.                Leiya Keesey    Your procedure is scheduled on: 04/29/20   Report to [facility] Main  Entrance   Report to admitting at: 7:00 AM     Call this number if you have problems the morning of surgery [phone]    Remember:   NO SOLID FOOD AFTER MIDNIGHT THE NIGHT PRIOR TO SURGERY. NOTHING BY MOUTH EXCEPT CLEAR LIQUIDS UNTIL: 6:25 am . PLEASE FINISH ENSURE DRINK PER SURGEON ORDER  WHICH NEEDS TO BE COMPLETED AT: 6:25 am .   CLEAR LIQUID DIET   Foods Allowed                                                                     Foods Excluded  Coffee and tea, regular and decaf                             liquids that you cannot  Plain Jell-O any favor except red or purple                                           see through such as: Fruit ices (not with fruit pulp)                                     milk, soups, orange juice  Iced Popsicles                                    All solid food Carbonated beverages, regular and diet                                    Cranberry, grape and apple juices Sports drinks like Gatorade Lightly seasoned clear broth or consume(fat free) Sugar, honey syrup  Sample Menu Breakfast                                Lunch                                     Supper Cranberry juice  Beef broth                            Chicken broth Jell-O                                     Grape juice                            Apple juice Coffee or tea                        Jell-O                                      Popsicle                                                Coffee or tea                        Coffee or tea  _____________________________________________________________________   BRUSH YOUR TEETH MORNING OF SURGERY AND RINSE YOUR MOUTH OUT, NO CHEWING GUM CANDY OR MINTS.     Take these medicines the morning of surgery with A SIP OF WATER: amlodipine,prednisone,leflunomide.Meclizine as needed.Use eye drops as usual.                                You may not have any metal on your body including hair pins and              piercings  Do not wear jewelry, make-up, lotions, powders or perfumes, deodorant             Do not wear nail polish on your fingernails.  Do not shave  48 hours prior to surgery.              Do not bring valuables to the hospital. [place].  Contacts, dentures or bridgework may not be worn into surgery.  Leave suitcase in the car. After surgery it may be brought to your room.     Patients discharged the day of surgery will not be allowed to drive home. IF YOU ARE HAVING SURGERY AND GOING HOME THE SAME DAY, YOU MUST HAVE AN ADULT TO DRIVE YOU HOME AND BE WITH YOU FOR 24 HOURS. YOU MAY GO HOME BY TAXI OR UBER OR ORTHERWISE, BUT AN ADULT MUST ACCOMPANY YOU HOME AND STAY WITH YOU FOR 24 HOURS.  Name and phone number of your driver:  Special Instructions: N/A              Please read over the following fact sheets you were given: _____________________________________________________________________        [facility] - Preparing for Surgery Before surgery, you can play an important role.  Because skin is not sterile, your skin needs to be as free of germs as possible.  You can reduce the number of germs  on your skin by washing with CHG (chlorahexidine gluconate) soap before surgery.  CHG is an antiseptic cleaner which kills germs  and bonds with the skin to continue killing germs even after washing. Please DO NOT use if you have an allergy to CHG or antibacterial soaps.  If your skin becomes reddened/irritated stop using the CHG and inform your nurse when you arrive at Short Stay. Do not shave (including legs and underarms) for at least 48 hours prior to the first CHG shower.  You may shave your face/neck. Please follow these instructions carefully:  1.  Shower with CHG Soap the night before surgery and the  morning of Surgery.  2.  If you choose to wash your hair, wash your hair first as usual with your  normal  shampoo.  3.  After you shampoo, rinse your hair and body thoroughly to remove the  shampoo.                           4.  Use CHG as you would any other liquid soap.  You can apply chg directly  to the skin and wash                       Gently with a scrungie or clean washcloth.  5.  Apply the CHG Soap to your body ONLY FROM THE NECK DOWN.   Do not use on face/ open                           Wound or open sores. Avoid contact with eyes, ears mouth and genitals (private parts).                       Wash face,  Genitals (private parts) with your normal soap.             6.  Wash thoroughly, paying special attention to the area where your surgery  will be performed.  7.  Thoroughly rinse your body with warm water from the neck down.  8.  DO NOT shower/wash with your normal soap after using and rinsing off  the CHG Soap.                9.  Pat yourself dry with a clean towel.            10.  Wear clean pajamas.            11.  Place clean sheets on your bed the night of your first shower and do not  sleep with pets. Day of Surgery : Do not apply any lotions/deodorants the morning of surgery.  Please wear clean clothes to the hospital/surgery center.  FAILURE TO FOLLOW THESE INSTRUCTIONS MAY RESULT IN THE CANCELLATION OF YOUR SURGERY PATIENT SIGNATURE_________________________________  NURSE  SIGNATURE__________________________________  ________________________________________________________________________   Adam Phenix  An incentive spirometer is a tool that can help keep your lungs clear and active. This tool measures how well you are filling your lungs with each breath. Taking long deep breaths may help reverse or decrease the chance of developing breathing (pulmonary) problems (especially infection) following:  A long period of time when you are unable to move or be active. BEFORE THE PROCEDURE   If the spirometer includes an indicator to show your best effort, your nurse or respiratory therapist will set it to a desired goal.  If possible, sit up straight or lean slightly forward. Try not to slouch.  Hold the incentive spirometer in an upright position. INSTRUCTIONS FOR USE  1. Sit on the edge of your bed if possible, or sit up as far as you can in bed or on a chair. 2. Hold the incentive spirometer in an upright position. 3. Breathe out normally. 4. Place the mouthpiece in your mouth and seal your lips tightly around it. 5. Breathe in slowly and as deeply as possible, raising the piston or the ball toward the top of the column. 6. Hold your breath for 3-5 seconds or for as long as possible. Allow the piston or ball to fall to the bottom of the column. 7. Remove the mouthpiece from your mouth and breathe out normally. 8. Rest for a few seconds and repeat Steps 1 through 7 at least 10 times every 1-2 hours when you are awake. Take your time and take a few normal breaths between deep breaths. 9. The spirometer may include an indicator to show your best effort. Use the indicator as a goal to work toward during each repetition. 10. After each set of 10 deep breaths, practice coughing to be sure your lungs are clear. If you have an incision (the cut made at the time of surgery), support your incision when coughing by placing a pillow or rolled up towels firmly  against it. Once you are able to get out of bed, walk around indoors and cough well. You may stop using the incentive spirometer when instructed by your caregiver.  RISKS AND COMPLICATIONS  Take your time so you do not get dizzy or light-headed.  If you are in pain, you may need to take or ask for pain medication before doing incentive spirometry. It is harder to take a deep breath if you are having pain. AFTER USE  Rest and breathe slowly and easily.  It can be helpful to keep track of a log of your progress. Your caregiver can provide you with a simple table to help with this. If you are using the spirometer at home, follow these instructions: [place] IF:   You are having difficultly using the spirometer.  You have trouble using the spirometer as often as instructed.  Your pain medication is not giving enough relief while using the spirometer.  You develop fever of 100.5 F (38.1 C) or higher. SEEK IMMEDIATE MEDICAL CARE IF:   You cough up bloody sputum that had not been present before.  You develop fever of 102 F (38.9 C) or greater.  You develop worsening pain at or near the incision site. MAKE SURE YOU:   Understand these instructions.  Will watch your condition.  Will get help right away if you are not doing well or get worse. Document Released: 02/08/2007 Document Revised: 12/21/2011 Document Reviewed: 04/11/2007 [facility] Patient Information 2014 [place], [place].   ________________________________________________________________________

## 2020-04-23 ENCOUNTER — Other Ambulatory Visit: Payer: Self-pay

## 2020-04-23 ENCOUNTER — Encounter (HOSPITAL_COMMUNITY)
Admission: RE | Admit: 2020-04-23 | Discharge: 2020-04-23 | Disposition: A | Discharge status: Home / Self Care | Payer: Medicare Other | Source: Ambulatory Visit | Attending: Orthopedic Surgery | Admitting: Orthopedic Surgery

## 2020-04-23 ENCOUNTER — Encounter (HOSPITAL_COMMUNITY): Payer: Self-pay

## 2020-04-23 DIAGNOSIS — Z01818 Encounter for other preprocedural examination: Secondary | ICD-10-CM | POA: Diagnosis not present

## 2020-04-23 LAB — COMPREHENSIVE METABOLIC PANEL
ALT: 13 U/L (ref 0–44)
AST: 20 U/L (ref 15–41)
Albumin: 4.1 g/dL (ref 3.5–5.0)
Alkaline Phosphatase: 53 U/L (ref 38–126)
Anion gap: 6 (ref 5–15)
BUN: 16 mg/dL (ref 8–23)
CO2: 29 mmol/L (ref 22–32)
Calcium: 9.7 mg/dL (ref 8.9–10.3)
Chloride: 103 mmol/L (ref 98–111)
Creatinine, Ser: 0.95 mg/dL (ref 0.44–1.00)
GFR calc Af Amer: >60 mL/min (ref >60)
GFR calc non Af Amer: 57 mL/min — ABNORMAL LOW (ref >60)
Glucose, Bld: 115 mg/dL — ABNORMAL HIGH (ref 70–99)
Potassium: 4.7 mmol/L (ref 3.5–5.1)
Sodium: 138 mmol/L (ref 135–145)
Total Bilirubin: 0.9 mg/dL (ref 0.3–1.2)
Total Protein: 6.9 g/dL (ref 6.5–8.1)

## 2020-04-23 LAB — CBC
HCT: 40.6 % (ref 36.0–46.0)
Hemoglobin: 13 g/dL (ref 12.0–15.0)
MCH: 31.9 pg (ref 26.0–34.0)
MCHC: 32 g/dL (ref 30.0–36.0)
MCV: 99.5 fL (ref 80.0–100.0)
Platelets: 171 10*3/uL (ref 150–400)
RBC: 4.08 MIL/uL (ref 3.87–5.11)
RDW: 12.5 % (ref 11.5–15.5)
WBC: 7.2 10*3/uL (ref 4.0–10.5)
nRBC: 0 % (ref 0.0–0.2)

## 2020-04-23 LAB — SURGICAL PCR SCREEN
MRSA, PCR: NEGATIVE
Staphylococcus aureus: NEGATIVE

## 2020-04-23 LAB — PROTIME-INR
INR: 0.9 (ref 0.8–1.2)
Prothrombin Time: 12.2 seconds (ref 11.4–15.2)

## 2020-04-23 LAB — APTT: aPTT: 27 seconds (ref 24–36)

## 2020-04-23 NOTE — Progress Notes (Signed)
COVID Vaccine Completed:yes Date COVID Vaccine completed:11/2019 COVID vaccine manufacturer: [employer]   PCP - Dr. Merilynn Finland. LOV: 04/17/20.  Cardiologist - No  Chest x-ray -  EKG -  Stress Test -  ECHO -  Cardiac Cath -   Sleep Study -  CPAP -   Fasting Blood Sugar -  Checks Blood Sugar _____ times a day  Blood Thinner Instructions: Aspirin Instructions: Last Dose:  Anesthesia review:   Patient denies shortness of breath, fever, cough and chest pain at PAT appointment   Patient verbalized understanding of instructions that were given to them at the PAT appointment. Patient was also instructed that they will need to review over the PAT instructions again at home before surgery.

## 2020-04-24 NOTE — H&P (Signed)
TOTAL KNEE ADMISSION H&P  Patient is being admitted for right total knee arthroplasty.  Subjective:  Chief Complaint: Right knee pain.  HPI: [name], 79 y.o. female has a history of pain and functional disability in the right knee due to arthritis and has failed non-surgical conservative treatments for greater than 12 weeks to include corticosteriod injections and activity modification. Onset of symptoms was gradual, starting several years ago with gradually worsening course since that time. The patient noted no past surgery on the right knee.  Patient currently rates pain in the right knee at 8 out of 10 with activity. Patient has worsening of pain with activity and weight bearing, pain that interferes with activities of daily living, crepitus and instability. Patient has evidence of near bone-on-bone arthritis in the lateral compartment. There is bone-on-bone arthritis in the patellofemoral compartment by imaging studies. There is no active infection.  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    Past Medical History:  Diagnosis Date   Abnormality of gait 04/17/2013   Cervical spondylosis    Cervicogenic headache    Right occipital neuralgia   Depression    Dyslipidemia    Gait disorder    Hyperlipidemia    Hypertension    Normal pressure hydrocephalus (HCC)    Occipital neuralgia 04/17/2013   right   Osteomyelitis of left knee region [facility])    Rheumatoid arthritis(714.0)    Vertigo 02/19/2015    Past Surgical History:  Procedure Laterality Date   CERVICAL SPINE SURGERY     JOINT REPLACEMENT     LUMBAR SPINE SURGERY     OVARY SURGERY      REPLACEMENT TOTAL KNEE Left    VENTRICULO-PERITONEAL SHUNT PLACEMENT / [place]      Prior to Admission medications   Medication Sig Start Date End Date Taking? Authorizing Provider  acetaminophen-codeine (TYLENOL #3) 300-30 MG tablet Take 1 tablet by mouth 3 (three) times daily as needed for pain. 04/01/20  Yes [provider]  alendronate (FOSAMAX) 70 MG tablet Take 70 mg by mouth once a week. 01/12/20  Yes [provider]  amLODipine (NORVASC) 5 MG tablet Take 5 mg by mouth daily.     Yes [provider]  labetalol (NORMODYNE) 100 MG tablet Take 100 mg by mouth 2 (two) times daily.    Yes [provider]  latanoprost (XALATAN) 0.005 % ophthalmic solution Place 1 drop into both eyes at bedtime.  08/02/17  Yes [provider]  leflunomide (ARAVA) 10 MG tablet Take 10 mg by mouth daily.   Yes [provider]  Multiple Vitamin (MULTIVITAMIN) tablet Take 1 tablet by mouth daily.   Yes [provider]  ondansetron (ZOFRAN) 4 MG tablet Take 1 tablet (4 mg total) by mouth every 6 (six) hours. Patient taking differently: Take 4 mg by mouth every 8 (eight) hours as needed for nausea or vomiting.  02/18/15  Yes Quintella Reichert, MD  predniSONE (DELTASONE) 1 MG tablet Take 4 mg by mouth daily.    Yes [provider]  traZODone (DESYREL) 50 MG tablet TAKE 1 TABLET BY MOUTH AT BEDTIME. GENERIC EQUIVALENT FOR DESYREL Patient taking differently: Take 50 mg by mouth at  bedtime.  12/18/19  Yes Kathrynn Ducking, MD  acetaminophen (TYLENOL) 500 MG tablet Take 1,000 mg by mouth 3 (three) times daily as needed for mild pain.     [provider]  Calcium-Vitamin D-Vitamin K (CALCIUM SOFT CHEWS PO) Take by mouth daily.     [provider]  meclizine (ANTIVERT) 25 MG tablet Take 1 tablet (25 mg total) by mouth 3 (three) times daily as needed for dizziness. 02/18/15   Quintella Reichert, MD  methocarbamol  (ROBAXIN-750) 750 MG tablet Take 1 tablet (750 mg total) by mouth every 8 (eight) hours as needed for muscle spasms. 11/07/19   Kathrynn Ducking, MD    Allergies  Allergen Reactions   Methotrexate Derivatives Other (See Comments)    Dangerously decreased platelet count   Penicillins Itching   Sulfa Antibiotics Itching    Social History   Socioeconomic History   Marital status: Married    Spouse name: Not on file   Number of children: 2   Years of education: 16   Highest education level: Not on file  Occupational History   Occupation: Retired    Fish farm manager: RETIRED  Tobacco Use   Smoking status: Former Smoker   Smokeless tobacco: Never Used  Scientific laboratory technician Use: Never used  Substance and Sexual Activity   Alcohol use: Not Currently   Drug use: No   Sexual activity: Not on file  Other Topics Concern   Not on file  Social History Narrative   Patient lives at home with her husband.    Patient has 2 children.    Patient has 12 + years of school.    Patient is left handed.   Patient drinks about 2 cups of caffeine daily.   Social Determinants of Health   Financial Resource Strain:    Difficulty of Paying Living Expenses:   Food Insecurity:    Worried About Charity fundraiser in the Last Year:    Arboriculturist in the Last Year:   Transportation Needs:    Film/video editor (Medical):    Lack of Transportation (Non-Medical):   Physical Activity:    Days of Exercise per Week:    Minutes of Exercise per Session:   Stress:    Feeling of Stress :   Social Connections:    Frequency of Communication with Friends and Family:    Frequency of Social Gatherings with Friends and Family:    Attends Religious Services:    Active Member of Clubs or Organizations:    Attends Archivist Meetings:    Marital Status:   Intimate Partner Violence:    Fear of Current or Ex-Partner:    Emotionally Abused:    Physically Abused:     Sexually Abused:       Tobacco Use: Medium Risk   Smoking Tobacco Use: Former Smoker   Smokeless Tobacco Use: Never Used   Social History   Substance and Sexual Activity  Alcohol Use Not Currently    Family History  Problem Relation Age of Onset   Ovarian cancer Mother    Heart disease Father    Brain cancer Sister     Review of Systems  Constitutional: Negative for chills and fever.  HENT: Negative for congestion, sore throat and tinnitus.   Eyes: Negative for double vision, photophobia and pain.  Respiratory: Negative for cough, shortness of breath and wheezing.   Cardiovascular: Negative for chest pain, palpitations and orthopnea.  Gastrointestinal:  Negative for heartburn, nausea and vomiting.  Genitourinary: Negative for dysuria, frequency and urgency.  Musculoskeletal: Positive for joint pain.  Neurological: Negative for dizziness, weakness and headaches.    Objective:  Physical Exam: Well nourished and well developed.  General: Alert and oriented x3, cooperative and pleasant, no acute distress.  Head: normocephalic, atraumatic, neck supple.  Eyes: EOMI.  Respiratory: breath sounds clear in all fields, no wheezing, rales, or rhonchi. Cardiovascular: Regular rate and rhythm, no murmurs, gallops or rubs.  Abdomen: non-tender to palpation and soft, normoactive bowel sounds. Musculoskeletal:  Right Knee Exam:  Valgus deformity.  No effusion present. No swelling present.  The range of motion is: 5 to 125 degrees.  Moderate crepitus on range of motion of the knee.  Lateral greater than medial joint line tenderness.  The knee is stable.  Calves soft and nontender. Motor function intact in LE. Strength 5/5 LE bilaterally. Neuro: Distal pulses 2+. Sensation to light touch intact in LE.  Vital signs in last 24 hours: Temp:  [98.7 F (37.1 C)] 98.7 F (37.1 C) (07/13 1313) Pulse Rate:  [67] 67 (07/13 1313) Resp:  [21] 21 (07/13 1313) BP: (117)/(58) 117/58  (07/13 1313) Weight:  [72.5 kg] 72.5 kg (07/13 1313)  Imaging Review Plain radiographs demonstrate severe degenerative joint disease of the right knee. The overall alignment is mild valgus. The bone quality appears to be adequate for age and reported activity level.  Assessment/Plan:  End stage arthritis, right knee   The patient history, physical examination, clinical judgment of the provider and imaging studies are consistent with end stage degenerative joint disease of the right knee and total knee arthroplasty is deemed medically necessary. The treatment options including medical management, injection therapy arthroscopy and arthroplasty were discussed at length. The risks and benefits of total knee arthroplasty were presented and reviewed. The risks due to aseptic loosening, infection, stiffness, patella tracking problems, thromboembolic complications and other imponderables were discussed. The patient acknowledged the explanation, agreed to proceed with the plan and consent was signed. Patient is being admitted for inpatient treatment for surgery, pain control, PT, OT, prophylactic antibiotics, VTE prophylaxis, progressive ambulation and ADLs and discharge planning. The patient is planning to be discharged home.   Patient's anticipated LOS is less than 2 midnights, meeting these requirements: - Younger than 64 - Lives within 1 hour of care - Has a competent adult at home to recover with post-op recover - NO history of  - Chronic pain requiring opiods  - Diabetes  - Coronary Artery Disease  - Heart failure  - Heart attack  - Stroke  - DVT/VTE  - Cardiac arrhythmia  - Respiratory Failure/COPD  - Renal failure  - Anemia  - Advanced Liver disease  Therapy Plans: [facility] Disposition: [facility] with husband (independent living) Planned DVT Prophylaxis: Aspirin 325 mg BID DME Needed: None PCP: Merilynn Finland, MD (clearance received) TXA: IV Allergies: PCN (rash/itching), sulfa  (itching) Anesthesia Concerns: None BMI: 24.9 Last HgbA1c: Not diabetic  - Patient was instructed on what medications to stop prior to surgery. - Follow-up visit in 2 weeks with Dr. Wynelle Link - Begin physical therapy following surgery - Pre-operative lab work as pre-surgical testing - Prescriptions will be provided in hospital at time of discharge  Theresa Duty, PA-C Orthopedic Surgery [facility] [place]

## 2020-04-25 ENCOUNTER — Other Ambulatory Visit (HOSPITAL_COMMUNITY)
Admission: RE | Admit: 2020-04-25 | Discharge: 2020-04-25 | Disposition: A | Discharge status: Home / Self Care | Payer: Medicare Other | Source: Ambulatory Visit | Attending: Orthopedic Surgery | Admitting: Orthopedic Surgery

## 2020-04-25 DIAGNOSIS — Z01812 Encounter for preprocedural laboratory examination: Secondary | ICD-10-CM | POA: Diagnosis present

## 2020-04-25 DIAGNOSIS — Z20822 Contact with and (suspected) exposure to covid-19: Secondary | ICD-10-CM | POA: Diagnosis not present

## 2020-04-25 LAB — SARS CORONAVIRUS 2 (TAT 6-24 HRS): SARS Coronavirus 2: NEGATIVE

## 2020-04-26 NOTE — Progress Notes (Signed)
Pt's husband was notified about surgical time change.Pt. will drink ensute at 5:15 am.Also she will arrive at 5:45 am.

## 2020-04-28 MED ORDER — BUPIVACAINE LIPOSOME 1.3 % IJ SUSP
20.0000 mL | Freq: Once | INTRAMUSCULAR | Status: DC
  Filled 2020-04-28: qty 20

## 2020-04-29 ENCOUNTER — Inpatient Hospital Stay (HOSPITAL_COMMUNITY)
Admission: RE | Admit: 2020-04-29 | Discharge: 2020-05-02 | DRG: 470 | Disposition: A | Discharge status: Home Health | Payer: Medicare Other | Attending: Orthopedic Surgery | Admitting: Orthopedic Surgery

## 2020-04-29 ENCOUNTER — Encounter (HOSPITAL_COMMUNITY): Payer: Self-pay | Admitting: Orthopedic Surgery

## 2020-04-29 ENCOUNTER — Ambulatory Visit (HOSPITAL_COMMUNITY): Payer: Medicare Other | Admitting: Anesthesiology

## 2020-04-29 ENCOUNTER — Other Ambulatory Visit: Payer: Self-pay

## 2020-04-29 ENCOUNTER — Encounter (HOSPITAL_COMMUNITY)
Admission: RE | Disposition: A | Discharge status: Home Health | Payer: Self-pay | Source: Home / Self Care | Attending: Orthopedic Surgery

## 2020-04-29 DIAGNOSIS — Z8249 Family history of ischemic heart disease and other diseases of the circulatory system: Secondary | ICD-10-CM

## 2020-04-29 DIAGNOSIS — Z96652 Presence of left artificial knee joint: Secondary | ICD-10-CM | POA: Diagnosis present

## 2020-04-29 DIAGNOSIS — Z20822 Contact with and (suspected) exposure to covid-19: Secondary | ICD-10-CM | POA: Diagnosis present

## 2020-04-29 DIAGNOSIS — M069 Rheumatoid arthritis, unspecified: Secondary | ICD-10-CM | POA: Diagnosis present

## 2020-04-29 DIAGNOSIS — M1711 Unilateral primary osteoarthritis, right knee: Principal | ICD-10-CM | POA: Diagnosis present

## 2020-04-29 DIAGNOSIS — Z79899 Other long term (current) drug therapy: Secondary | ICD-10-CM

## 2020-04-29 DIAGNOSIS — Z7952 Long term (current) use of systemic steroids: Secondary | ICD-10-CM

## 2020-04-29 DIAGNOSIS — M179 Osteoarthritis of knee, unspecified: Secondary | ICD-10-CM | POA: Diagnosis present

## 2020-04-29 DIAGNOSIS — Z808 Family history of malignant neoplasm of other organs or systems: Secondary | ICD-10-CM

## 2020-04-29 DIAGNOSIS — Z8041 Family history of malignant neoplasm of ovary: Secondary | ICD-10-CM

## 2020-04-29 HISTORY — PX: TOTAL KNEE ARTHROPLASTY: SHX125

## 2020-04-29 LAB — TYPE AND SCREEN
ABO/RH(D): A NEG
Antibody Screen: NEGATIVE

## 2020-04-29 SURGERY — ARTHROPLASTY, KNEE, TOTAL
Anesthesia: General | Site: Knee | Laterality: Right

## 2020-04-29 MED ORDER — EPHEDRINE SULFATE 50 MG/ML IJ SOLN
INTRAMUSCULAR | Status: DC | PRN
Start: 2020-04-29 — End: 2020-04-29
  Administered 2020-04-29: 15 mg via INTRAVENOUS
  Administered 2020-04-29 (×2): 10 mg via INTRAVENOUS

## 2020-04-29 MED ORDER — ONDANSETRON HCL 4 MG/2ML IJ SOLN
INTRAMUSCULAR | Status: DC | PRN
  Administered 2020-04-29: 4 mg via INTRAVENOUS

## 2020-04-29 MED ORDER — STERILE WATER FOR IRRIGATION IR SOLN
Status: DC | PRN
  Administered 2020-04-29 (×2): 1000 mL

## 2020-04-29 MED ORDER — LIDOCAINE HCL (CARDIAC) PF 100 MG/5ML IV SOSY
PREFILLED_SYRINGE | INTRAVENOUS | Status: DC | PRN
  Administered 2020-04-29: 100 mg via INTRAVENOUS

## 2020-04-29 MED ORDER — SODIUM CHLORIDE (PF) 0.9 % IJ SOLN
INTRAMUSCULAR | Status: AC
  Filled 2020-04-29: qty 10

## 2020-04-29 MED ORDER — SODIUM CHLORIDE (PF) 0.9 % IJ SOLN
INTRAMUSCULAR | Status: AC
  Filled 2020-04-29: qty 50

## 2020-04-29 MED ORDER — SODIUM CHLORIDE (PF) 0.9 % IJ SOLN
INTRAMUSCULAR | Status: DC | PRN
  Administered 2020-04-29: 60 mL

## 2020-04-29 MED ORDER — ACETAMINOPHEN 10 MG/ML IV SOLN
1000.0000 mg | Freq: Four times a day (QID) | INTRAVENOUS | Status: DC
  Administered 2020-04-29: 1000 mg via INTRAVENOUS
  Filled 2020-04-29: qty 100

## 2020-04-29 MED ORDER — METOCLOPRAMIDE HCL 5 MG/ML IJ SOLN
5.0000 mg | Freq: Three times a day (TID) | INTRAMUSCULAR | Status: DC | PRN
  Administered 2020-04-29: 10 mg via INTRAVENOUS
  Filled 2020-04-29: qty 2

## 2020-04-29 MED ORDER — PROPOFOL 10 MG/ML IV BOLUS
INTRAVENOUS | Status: DC | PRN
  Administered 2020-04-29: 120 mg via INTRAVENOUS
  Administered 2020-04-29: 30 mg via INTRAVENOUS

## 2020-04-29 MED ORDER — HYDROMORPHONE HCL 1 MG/ML IJ SOLN
INTRAMUSCULAR | Status: AC
  Filled 2020-04-29: qty 1

## 2020-04-29 MED ORDER — HYDROMORPHONE HCL 1 MG/ML IJ SOLN
0.2500 mg | INTRAMUSCULAR | Status: DC | PRN
  Administered 2020-04-29 (×3): 0.5 mg via INTRAVENOUS

## 2020-04-29 MED ORDER — POVIDONE-IODINE 10 % EX SWAB
2.0000 "application " | Freq: Once | CUTANEOUS | Status: AC
  Administered 2020-04-29: 2 via TOPICAL

## 2020-04-29 MED ORDER — TRANEXAMIC ACID-NACL 1000-0.7 MG/100ML-% IV SOLN
1000.0000 mg | INTRAVENOUS | Status: AC
  Administered 2020-04-29: 1000 mg via INTRAVENOUS
  Filled 2020-04-29: qty 100

## 2020-04-29 MED ORDER — ACETAMINOPHEN 500 MG PO TABS
1000.0000 mg | ORAL_TABLET | Freq: Four times a day (QID) | ORAL | Status: AC
  Administered 2020-04-29 – 2020-04-30 (×3): 1000 mg via ORAL
  Filled 2020-04-29 (×3): qty 2

## 2020-04-29 MED ORDER — LABETALOL HCL 100 MG PO TABS
100.0000 mg | ORAL_TABLET | Freq: Two times a day (BID) | ORAL | Status: DC
  Administered 2020-04-30 – 2020-05-02 (×5): 100 mg via ORAL
  Filled 2020-04-29 (×5): qty 1

## 2020-04-29 MED ORDER — MENTHOL 3 MG MT LOZG: 1.0000 | LOZENGE | OROMUCOSAL | Status: DC | PRN

## 2020-04-29 MED ORDER — CEFAZOLIN SODIUM-DEXTROSE 2-4 GM/100ML-% IV SOLN
2.0000 g | INTRAVENOUS | Status: AC
  Administered 2020-04-29: 2 g via INTRAVENOUS
  Filled 2020-04-29: qty 100

## 2020-04-29 MED ORDER — BUPIVACAINE LIPOSOME 1.3 % IJ SUSP
INTRAMUSCULAR | Status: DC | PRN
  Administered 2020-04-29: 20 mL

## 2020-04-29 MED ORDER — METHOCARBAMOL 500 MG IVPB - SIMPLE MED
500.0000 mg | Freq: Four times a day (QID) | INTRAVENOUS | Status: DC | PRN
  Administered 2020-04-29: 500 mg via INTRAVENOUS
  Filled 2020-04-29: qty 50

## 2020-04-29 MED ORDER — ONDANSETRON HCL 4 MG/2ML IJ SOLN: 4.0000 mg | Freq: Once | INTRAMUSCULAR | Status: DC | PRN

## 2020-04-29 MED ORDER — MEPERIDINE HCL 50 MG/ML IJ SOLN: 6.2500 mg | INTRAMUSCULAR | Status: DC | PRN

## 2020-04-29 MED ORDER — DOCUSATE SODIUM 100 MG PO CAPS
100.0000 mg | ORAL_CAPSULE | Freq: Two times a day (BID) | ORAL | Status: DC
  Administered 2020-04-29 – 2020-05-02 (×5): 100 mg via ORAL
  Filled 2020-04-29 (×6): qty 1

## 2020-04-29 MED ORDER — EPHEDRINE 5 MG/ML INJ
INTRAVENOUS | Status: AC
  Filled 2020-04-29: qty 10

## 2020-04-29 MED ORDER — BISACODYL 10 MG RE SUPP: 10.0000 mg | Freq: Every day | RECTAL | Status: DC | PRN

## 2020-04-29 MED ORDER — ONDANSETRON HCL 4 MG/2ML IJ SOLN
INTRAMUSCULAR | Status: AC
  Filled 2020-04-29: qty 2

## 2020-04-29 MED ORDER — TRAZODONE HCL 50 MG PO TABS
50.0000 mg | ORAL_TABLET | Freq: Every day | ORAL | Status: DC
  Administered 2020-04-29 – 2020-05-01 (×3): 50 mg via ORAL
  Filled 2020-04-29 (×3): qty 1

## 2020-04-29 MED ORDER — ORAL CARE MOUTH RINSE: 15.0000 mL | Freq: Once | OROMUCOSAL | Status: AC

## 2020-04-29 MED ORDER — DEXAMETHASONE SODIUM PHOSPHATE 10 MG/ML IJ SOLN
INTRAMUSCULAR | Status: AC
  Filled 2020-04-29: qty 1

## 2020-04-29 MED ORDER — CHLORHEXIDINE GLUCONATE 0.12 % MT SOLN
15.0000 mL | Freq: Once | OROMUCOSAL | Status: AC
  Administered 2020-04-29: 15 mL via OROMUCOSAL

## 2020-04-29 MED ORDER — METHOCARBAMOL 500 MG IVPB - SIMPLE MED
INTRAVENOUS | Status: AC
  Filled 2020-04-29: qty 50

## 2020-04-29 MED ORDER — DIPHENHYDRAMINE HCL 12.5 MG/5ML PO ELIX: 12.5000 mg | ORAL_SOLUTION | ORAL | Status: DC | PRN

## 2020-04-29 MED ORDER — METOCLOPRAMIDE HCL 5 MG PO TABS: 5.0000 mg | ORAL_TABLET | Freq: Three times a day (TID) | ORAL | Status: DC | PRN

## 2020-04-29 MED ORDER — LEFLUNOMIDE 20 MG PO TABS
10.0000 mg | ORAL_TABLET | Freq: Every day | ORAL | Status: DC
  Administered 2020-04-30 – 2020-05-02 (×3): 10 mg via ORAL
  Filled 2020-04-29 (×3): qty 0.5

## 2020-04-29 MED ORDER — AMLODIPINE BESYLATE 5 MG PO TABS
5.0000 mg | ORAL_TABLET | Freq: Every day | ORAL | Status: DC
  Administered 2020-04-30 – 2020-05-02 (×3): 5 mg via ORAL
  Filled 2020-04-29 (×3): qty 1

## 2020-04-29 MED ORDER — SODIUM CHLORIDE 0.9 % IR SOLN
Status: DC | PRN
  Administered 2020-04-29: 1000 mL

## 2020-04-29 MED ORDER — ONDANSETRON HCL 4 MG/2ML IJ SOLN: 4.0000 mg | Freq: Four times a day (QID) | INTRAMUSCULAR | Status: DC | PRN

## 2020-04-29 MED ORDER — SODIUM CHLORIDE 0.9 % IV SOLN
INTRAVENOUS | Status: DC
  Administered 2020-04-29: 75 mL/h via INTRAVENOUS

## 2020-04-29 MED ORDER — ASPIRIN EC 325 MG PO TBEC
325.0000 mg | DELAYED_RELEASE_TABLET | Freq: Two times a day (BID) | ORAL | Status: DC
  Administered 2020-04-30 – 2020-05-02 (×5): 325 mg via ORAL
  Filled 2020-04-29 (×5): qty 1

## 2020-04-29 MED ORDER — METHOCARBAMOL 500 MG PO TABS
500.0000 mg | ORAL_TABLET | Freq: Four times a day (QID) | ORAL | Status: DC | PRN
  Administered 2020-04-29 – 2020-05-02 (×10): 500 mg via ORAL
  Filled 2020-04-29 (×10): qty 1

## 2020-04-29 MED ORDER — CEFAZOLIN SODIUM-DEXTROSE 2-4 GM/100ML-% IV SOLN
2.0000 g | Freq: Four times a day (QID) | INTRAVENOUS | Status: AC
  Administered 2020-04-29 (×2): 2 g via INTRAVENOUS
  Filled 2020-04-29 (×2): qty 100

## 2020-04-29 MED ORDER — ROPIVACAINE HCL 7.5 MG/ML IJ SOLN
INTRAMUSCULAR | Status: DC | PRN
  Administered 2020-04-29: 20 mL via PERINEURAL

## 2020-04-29 MED ORDER — ONDANSETRON HCL 4 MG PO TABS: 4.0000 mg | ORAL_TABLET | Freq: Four times a day (QID) | ORAL | Status: DC | PRN

## 2020-04-29 MED ORDER — LACTATED RINGERS IV SOLN
INTRAVENOUS | Status: DC
  Administered 2020-04-29: 1000 mL via INTRAVENOUS

## 2020-04-29 MED ORDER — OXYCODONE HCL 5 MG PO TABS
5.0000 mg | ORAL_TABLET | ORAL | Status: DC | PRN
  Administered 2020-04-29 – 2020-05-02 (×10): 5 mg via ORAL
  Filled 2020-04-29 (×10): qty 1

## 2020-04-29 MED ORDER — FENTANYL CITRATE (PF) 100 MCG/2ML IJ SOLN
50.0000 ug | INTRAMUSCULAR | Status: DC
  Filled 2020-04-29: qty 2

## 2020-04-29 MED ORDER — LACTATED RINGERS IV SOLN: INTRAVENOUS | Status: DC

## 2020-04-29 MED ORDER — PHENOL 1.4 % MT LIQD: 1.0000 | OROMUCOSAL | Status: DC | PRN

## 2020-04-29 MED ORDER — OXYCODONE HCL 5 MG PO TABS
10.0000 mg | ORAL_TABLET | ORAL | Status: DC | PRN
  Administered 2020-04-29 – 2020-04-30 (×2): 10 mg via ORAL
  Filled 2020-04-29 (×2): qty 2

## 2020-04-29 MED ORDER — POLYETHYLENE GLYCOL 3350 17 G PO PACK: 17.0000 g | PACK | Freq: Every day | ORAL | Status: DC | PRN

## 2020-04-29 MED ORDER — DEXAMETHASONE SODIUM PHOSPHATE 10 MG/ML IJ SOLN
8.0000 mg | Freq: Once | INTRAMUSCULAR | Status: AC
  Administered 2020-04-29: 8 mg via INTRAVENOUS

## 2020-04-29 MED ORDER — PHENYLEPHRINE HCL (PRESSORS) 10 MG/ML IV SOLN
INTRAVENOUS | Status: AC
  Filled 2020-04-29: qty 1

## 2020-04-29 MED ORDER — FENTANYL CITRATE (PF) 250 MCG/5ML IJ SOLN
INTRAMUSCULAR | Status: AC
  Filled 2020-04-29: qty 5

## 2020-04-29 MED ORDER — TRAMADOL HCL 50 MG PO TABS
50.0000 mg | ORAL_TABLET | Freq: Four times a day (QID) | ORAL | Status: DC | PRN
  Administered 2020-04-30 (×2): 100 mg via ORAL
  Filled 2020-04-29 (×2): qty 2

## 2020-04-29 MED ORDER — FENTANYL CITRATE (PF) 250 MCG/5ML IJ SOLN
INTRAMUSCULAR | Status: DC | PRN
  Administered 2020-04-29 (×2): 25 ug via INTRAVENOUS
  Administered 2020-04-29: 50 ug via INTRAVENOUS

## 2020-04-29 MED ORDER — LATANOPROST 0.005 % OP SOLN
1.0000 [drp] | Freq: Every day | OPHTHALMIC | Status: DC
  Administered 2020-04-29 – 2020-05-01 (×3): 1 [drp] via OPHTHALMIC
  Filled 2020-04-29: qty 2.5

## 2020-04-29 MED ORDER — PREDNISONE 1 MG PO TABS
4.0000 mg | ORAL_TABLET | Freq: Every day | ORAL | Status: DC
  Administered 2020-04-30 – 2020-05-02 (×3): 4 mg via ORAL
  Filled 2020-04-29 (×3): qty 4

## 2020-04-29 MED ORDER — FLEET ENEMA 7-19 GM/118ML RE ENEM: 1.0000 | ENEMA | Freq: Once | RECTAL | Status: DC | PRN

## 2020-04-29 MED ORDER — MORPHINE SULFATE (PF) 2 MG/ML IV SOLN
0.5000 mg | INTRAVENOUS | Status: DC | PRN
  Administered 2020-04-29: 1 mg via INTRAVENOUS
  Filled 2020-04-29: qty 1

## 2020-04-29 MED ORDER — PROPOFOL 10 MG/ML IV BOLUS
INTRAVENOUS | Status: AC
  Filled 2020-04-29: qty 20

## 2020-04-29 SURGICAL SUPPLY — 54 items
BAG ZIPLOCK 12X15 (MISCELLANEOUS) ×2 IMPLANT
BLADE SAG 18X100X1.27 (BLADE) ×2 IMPLANT
BLADE SAW SGTL 11.0X1.19X90.0M (BLADE) ×2 IMPLANT
BLADE SURG SZ10 CARB STEEL (BLADE) ×4 IMPLANT
BNDG ELASTIC 6X10 VLCR STRL LF (GAUZE/BANDAGES/DRESSINGS) ×2 IMPLANT
BNDG ELASTIC 6X5.8 VLCR STR LF (GAUZE/BANDAGES/DRESSINGS) ×2 IMPLANT
BOWL SMART MIX CTS (DISPOSABLE) ×2 IMPLANT
CEMENT HV SMART SET (Cement) ×4 IMPLANT
CEMENT TIBIA MBT SIZE 4 (Knees) ×1 IMPLANT
CLSR STERI-STRIP ANTIMIC 1/2X4 (GAUZE/BANDAGES/DRESSINGS) ×2 IMPLANT
COVER SURGICAL LIGHT HANDLE (MISCELLANEOUS) ×2 IMPLANT
COVER WAND RF STERILE (DRAPES) IMPLANT
CUFF TOURN SGL QUICK 34 (TOURNIQUET CUFF) ×1
CUFF TRNQT CYL 34X4.125X (TOURNIQUET CUFF) ×1 IMPLANT
DECANTER SPIKE VIAL GLASS SM (MISCELLANEOUS) ×2 IMPLANT
DRAPE U-SHAPE 47X51 STRL (DRAPES) ×2 IMPLANT
DRSG AQUACEL AG ADV 3.5X10 (GAUZE/BANDAGES/DRESSINGS) ×2 IMPLANT
DURAPREP 26ML APPLICATOR (WOUND CARE) ×2 IMPLANT
ELECT REM PT RETURN 15FT ADLT (MISCELLANEOUS) ×2 IMPLANT
FEMUR SIGMA PS SZ 3.0 R (Femur) ×2 IMPLANT
GLOVE BIO SURGEON STRL SZ7 (GLOVE) ×2 IMPLANT
GLOVE BIO SURGEON STRL SZ8 (GLOVE) ×2 IMPLANT
GLOVE BIOGEL PI IND STRL 7.0 (GLOVE) ×1 IMPLANT
GLOVE BIOGEL PI IND STRL 8 (GLOVE) ×1 IMPLANT
GLOVE BIOGEL PI INDICATOR 7.0 (GLOVE) ×1
GLOVE BIOGEL PI INDICATOR 8 (GLOVE) ×1
GOWN STRL REUS W/TWL LRG LVL3 (GOWN DISPOSABLE) ×4 IMPLANT
HANDPIECE INTERPULSE COAX TIP (DISPOSABLE) ×1
HOLDER FOLEY CATH W/STRAP (MISCELLANEOUS) IMPLANT
IMMOBILIZER KNEE 20 (SOFTGOODS) ×2
IMMOBILIZER KNEE 20 THIGH 36 (SOFTGOODS) ×1 IMPLANT
KIT TURNOVER KIT A (KITS) IMPLANT
MANIFOLD NEPTUNE II (INSTRUMENTS) ×2 IMPLANT
NS IRRIG 1000ML POUR BTL (IV SOLUTION) ×2 IMPLANT
PACK TOTAL KNEE CUSTOM (KITS) ×2 IMPLANT
PADDING CAST COTTON 6X4 STRL (CAST SUPPLIES) ×2 IMPLANT
PATELLA DOME PFC 35MM (Knees) ×2 IMPLANT
PENCIL SMOKE EVACUATOR (MISCELLANEOUS) IMPLANT
PIN DRILL FIX HALF THREAD (BIT) ×2 IMPLANT
PIN STEINMAN FIXATION KNEE (PIN) ×2 IMPLANT
PLATE ROT INSERT 12.5MM SIZE 3 (Plate) ×2 IMPLANT
PROTECTOR NERVE ULNAR (MISCELLANEOUS) ×2 IMPLANT
SET HNDPC FAN SPRY TIP SCT (DISPOSABLE) ×1 IMPLANT
STRIP CLOSURE SKIN 1/2X4 (GAUZE/BANDAGES/DRESSINGS) ×4 IMPLANT
SUT MNCRL AB 4-0 PS2 18 (SUTURE) ×2 IMPLANT
SUT STRATAFIX 0 PDS 27 VIOLET (SUTURE) ×2
SUT VIC AB 2-0 CT1 27 (SUTURE) ×3
SUT VIC AB 2-0 CT1 TAPERPNT 27 (SUTURE) ×3 IMPLANT
SUTURE STRATFX 0 PDS 27 VIOLET (SUTURE) ×1 IMPLANT
TIBIA MBT CEMENT SIZE 4 (Knees) ×2 IMPLANT
TRAY FOLEY MTR SLVR 16FR STAT (SET/KITS/TRAYS/PACK) IMPLANT
WATER STERILE IRR 1000ML POUR (IV SOLUTION) ×4 IMPLANT
WRAP KNEE MAXI GEL POST OP (GAUZE/BANDAGES/DRESSINGS) ×2 IMPLANT
YANKAUER SUCT BULB TIP 10FT TU (MISCELLANEOUS) ×2 IMPLANT

## 2020-04-29 NOTE — Progress Notes (Signed)
AssistedDr. Ossey with right, ultrasound guided, adductor canal block. Side rails up, monitors on throughout procedure. See vital signs in flow sheet. Tolerated Procedure well.  

## 2020-04-29 NOTE — Progress Notes (Signed)
Physical Therapy Evaluation Patient Details Name: [name] MRN: [id number] DOB: 24-Apr-1941 Today's Date: 04/29/2020   Clinical Impression [name] is a 79 y.o. female POD 0 s/p Rt TKA. Patient reports independence with [facility] for mobility at baseline. Patient is now limited by functional impairments (see PT problem list below) and requires min assist for bed mob and transfers with RW. Patient was able to complete stand step transfer to recliner with min assist to steady and manage walker. Patient instructed in exercise to facilitate ROM and circulation. Patient will benefit from continued skilled PT interventions to address impairments and progress towards PLOF. Acute PT will follow to progress mobility and stair training in preparation for safe discharge home.    04/29/20 1400  PT Visit Information  Last PT Received On 04/29/20  Assistance Needed +1  History of Present Illness Patient is 79 y.o. female s/p Rt TKA on 04/29/20 with PMH significant for vertigo, RA, OA, HTN, HLD, depression, VP shunt, lumbar surgery.  Precautions  Precautions Fall  Restrictions  Weight Bearing Restrictions No  Other Position/Activity Restrictions WBAT  Home Living  Family/patient expects to be discharged to: Private residence [facility])  Living Arrangements Spouse/significant other  Available Help at Discharge Family  Type of [place] Access Level entry  [place] One level  Bathroom Shower/Tub Walk-in shower  Kyle bars - tub/shower;Walker - 2 wheels;Cane - single point;Wheelchair - [employer] - 4 wheels;BSC  Prior Function  Level of Independence Independent with assistive device(s)  Communication  Communication HOH  Pain Assessment  Pain Assessment 0-10  Pain Score 5  Pain Location Rt Knee  Pain Descriptors / Indicators Aching;Burning;Discomfort  Pain Intervention(s) Limited activity within patient's  tolerance;Monitored during session;Repositioned;Ice applied  Cognition  Arousal/Alertness Awake/alert  Behavior During Therapy WFL for tasks assessed/performed  Overall Cognitive Status Impaired/Different from baseline  General Comments suspect related to anesthesia. pt slightly groggy and delayed responses to all questions and cues for tasks.   Upper Extremity Assessment  Upper Extremity Assessment Generalized weakness  Lower Extremity Assessment  Lower Extremity Assessment Generalized weakness  Cervical / Trunk Assessment  Cervical / Trunk Assessment Normal  Bed Mobility  Overal bed mobility Needs Assistance  Bed Mobility Supine to Sit  Supine to sit [facility] elevated;Min assist  General bed mobility comments Cues for sequencing use of bed rail and assist to raise Rt LE  Transfers  Overall transfer level Needs assistance  Equipment used Rolling walker (2 wheeled)  Transfers Sit to/from Stand;Stand Pivot Transfers  Sit to Stand Min assist;From elevated surface  Stand pivot transfers Min assist  General transfer comment cues for safe technique with RW, assist required to rise. pt stood for ~ 10 seconds and sat back on bed without warning. Pt reported she just felt like her legs needed to give out. pt performed sit<>stand again and was able to stand for ~1 min. pt practiced marching in place and progressed to stand step transfer to recliner, min assist to steady and manage walker.   Balance  Overall balance assessment Needs assistance  Sitting-balance support Feet supported  Sitting balance-Leahy Scale Good  Standing balance support During functional activity;Bilateral upper extremity supported  Standing balance-Leahy Scale Poor  Exercises  Exercises Total Joint  Total Joint Exercises  Ankle Circles/Pumps AROM;Both;10 reps;Seated  Quad Sets AROM;Right;10 reps;Seated  Heel Slides AAROM;Right;5 reps;Seated  PT - End of Session  Equipment Utilized During Treatment Gait belt  Activity  Tolerance Patient tolerated treatment well  Patient left in chair;with call bell/phone within reach;with chair alarm set;with family/visitor present  Nurse Communication Mobility status  PT Assessment  PT Recommendation/Assessment Patient needs continued PT services  PT Visit Diagnosis Muscle weakness (generalized) (M62.81);Difficulty in walking, not elsewhere classified (R26.2)  PT Problem List Decreased strength;Decreased range of motion;Decreased activity tolerance;Decreased balance;Decreased mobility;Decreased knowledge of use of DME  PT Plan  PT Frequency (ACUTE ONLY) 7X/week  PT Treatment/Interventions (ACUTE ONLY) DME instruction;Gait training;Stair training;Functional mobility training;Therapeutic activities;Therapeutic exercise;Balance training;Patient/family education  AM-PAC PT "6 Clicks" Mobility Outcome Measure (Version 2)  Help needed turning from your back to your side while in a flat bed without using bedrails? 3  Help needed moving from lying on your back to sitting on the side of a flat bed without using bedrails? 3  Help needed moving to and from a bed to a chair (including a wheelchair)? 3  Help needed standing up from a chair using your arms (e.g., wheelchair or bedside chair)? 3  Help needed to walk in hospital room? 3  Help needed climbing 3-5 steps with a railing?  2  6 Click Score 17  Consider Recommendation of Discharge To: Home with [facility]  PT Recommendation  Follow Up Recommendations Follow surgeon's recommendation for DC plan and follow-up therapies  PT equipment None recommended by PT  Individuals Consulted  Consulted and Agree with Results and Recommendations Patient;Family member/caregiver  Family Member Consulted spouse Frankey Poot  Acute Rehab PT Goals  Patient Stated Goal go back home  PT Goal Formulation With patient  Time For Goal Achievement 05/06/20  Potential to Achieve Goals Good  PT Time Calculation  PT Start Time (ACUTE ONLY) 1433  PT Stop Time (ACUTE  ONLY) 1502  PT Time Calculation (min) (ACUTE ONLY) 29 min  PT General Charges  $$ ACUTE PT VISIT 1 Visit  PT Evaluation  $PT Eval Low Complexity 1 Low  PT Treatments  $Therapeutic Exercise 8-22 mins  Written Expression  Dominant Hand Left    Verner Mould, DPT Acute Rehabilitation Services  Office [phone] Pager [phone]  04/29/2020 7:48 PM

## 2020-04-29 NOTE — Progress Notes (Signed)
Orthopedic Tech Progress Note Patient Details:  [name] 1941-01-10 [id number]  CPM Right Knee CPM Right Knee: Off Right Knee Flexion (Degrees): 40 Right Knee Extension (Degrees): 10 Additional Comments: Trapeze bar  Post Interventions Patient Tolerated: Well  [name] 04/29/2020, 1:19 PM

## 2020-04-29 NOTE — Plan of Care (Signed)
Plan of care 

## 2020-04-29 NOTE — Anesthesia Postprocedure Evaluation (Signed)
Anesthesia Post Note  Patient: [name]  Procedure(s) Performed: TOTAL KNEE ARTHROPLASTY (Right Knee)     Patient location during evaluation: PACU Anesthesia Type: General Level of consciousness: awake and alert Pain management: pain level controlled Vital Signs Assessment: post-procedure vital signs reviewed and stable Respiratory status: spontaneous breathing, nonlabored ventilation, respiratory function stable and patient connected to nasal cannula oxygen Cardiovascular status: blood pressure returned to baseline and stable Postop Assessment: no apparent nausea or vomiting Anesthetic complications: no   No complications documented.  Last Vitals:  Vitals:   04/29/20 1314 04/29/20 1404  BP: (!) 148/85 120/70  Pulse: 86 80  Resp: 16 16  Temp: 36.6 C (!) 36.4 C  SpO2: 97% 98%    Last Pain:  Vitals:   04/29/20 1404  TempSrc: Oral  PainSc:                  Henrry Feil DAVID

## 2020-04-29 NOTE — Anesthesia Procedure Notes (Signed)
Procedure Name: LMA Insertion Date/Time: 04/29/2020 8:21 AM Performed by: Glory Buff, CRNA Pre-anesthesia Checklist: Patient identified, Emergency Drugs available, Suction available and Patient being monitored Patient Re-evaluated:Patient Re-evaluated prior to induction Oxygen Delivery Method: Circle system utilized Preoxygenation: Pre-oxygenation with 100% oxygen Induction Type: IV induction LMA: LMA inserted LMA Size: 4.0 Number of attempts: 1 Placement Confirmation: positive ETCO2 and breath sounds checked- equal and bilateral Tube secured with: Tape Dental Injury: Teeth and Oropharynx as per pre-operative assessment

## 2020-04-29 NOTE — Progress Notes (Signed)
Pt stable with no needs at time of bedside rounding. No needs or signs of distress at time of rounding.

## 2020-04-29 NOTE — Progress Notes (Signed)
Orthopedic Tech Progress Note Patient Details:  [name] 10-01-1941 [id number]  CPM Right Knee CPM Right Knee: On Right Knee Flexion (Degrees): 40 Right Knee Extension (Degrees): 10 Additional Comments: Trapeze bar  Post Interventions Patient Tolerated: Well  [place] Pink 04/29/2020, 10:03 AM

## 2020-04-29 NOTE — Op Note (Addendum)
OPERATIVE REPORT-TOTAL KNEE ARTHROPLASTY   Pre-operative diagnosis- Osteoarthritis  Right knee(s)  Post-operative diagnosis- Osteoarthritis Right knee(s)  Procedure-  Right  Total Knee Arthroplasty  Surgeon- Margaret Plover. Miyuki Rzasa, MD  Assistant- Theresa Duty, PA-C   Anesthesia-  GA combined with regional for post-op pain  EBL- 25 ml   Drains None  Tourniquet time-  Total Tourniquet Time Documented: Thigh (Right) - 31 minutes Total: Thigh (Right) - 31 minutes     Complications- None  Condition-PACU - hemodynamically stable.   Brief Clinical Note  [name] is a 79 y.o. year old female with end stage OA of her right knee with progressively worsening pain and dysfunction. She has constant pain, with activity and at rest and significant functional deficits with difficulties even with ADLs. She has had extensive non-op management including analgesics, injections of cortisone and viscosupplements, and home exercise program, but remains in significant pain with significant dysfunction.Radiographs show bone on bone arthritis medial and patellofemoral. She presents now for right Total Knee Arthroplasty.    Procedure in detail---   The patient is brought into the operating room and positioned supine on the operating table. After successful administration of  GA combined with regional for post-op pain,   a tourniquet is placed high on the  Right thigh(s) and the lower extremity is prepped and draped in the usual sterile fashion. Time out is performed by the operating team and then the  Right lower extremity is wrapped in Esmarch, knee flexed and the tourniquet inflated to 300 mmHg.       A midline incision is made with a ten blade through the subcutaneous tissue to the level of the extensor mechanism. A fresh blade is used to make a medial parapatellar arthrotomy. Soft tissue over the proximal medial tibia is subperiosteally elevated to the joint line with a knife and into the  semimembranosus bursa with a Cobb elevator. Soft tissue over the proximal lateral tibia is elevated with attention being paid to avoiding the patellar tendon on the tibial tubercle. The patella is everted, knee flexed 90 degrees and the ACL and PCL are removed. Findings are bone on bone medial and patellofemoral with large global osteophytes.        The drill is used to create a starting hole in the distal femur and the canal is thoroughly irrigated with sterile saline to remove the fatty contents. The 5 degree Right  valgus alignment guide is placed into the femoral canal and the distal femoral cutting block is pinned to remove 10 mm off the distal femur. Resection is made with an oscillating saw.      The tibia is subluxed forward and the menisci are removed. The extramedullary alignment guide is placed referencing proximally at the medial aspect of the tibial tubercle and distally along the second metatarsal axis and tibial crest. The block is pinned to remove 73mm off the more deficient medial  side. Resection is made with an oscillating saw. Size 4is the most appropriate size for the tibia and the proximal tibia is prepared with the modular drill and keel punch for that size.      The femoral sizing guide is placed and size 3 is most appropriate. Rotation is marked off the epicondylar axis and confirmed by creating a rectangular flexion gap at 90 degrees. The size 3 cutting block is pinned in this rotation and the anterior, posterior and chamfer cuts are made with the oscillating saw. The intercondylar block is then placed and that cut  is made.      Trial size 4 tibial component, trial size 3 posterior stabilized femur and a 12.5  mm posterior stabilized rotating platform insert trial is placed. Full extension is achieved with excellent varus/valgus and anterior/posterior balance throughout full range of motion. The patella is everted and thickness measured to be 22  mm. Free hand resection is taken to 12  mm, a 35 template is placed, lug holes are drilled, trial patella is placed, and it tracks normally. Osteophytes are removed off the posterior femur with the trial in place. All trials are removed and the cut bone surfaces prepared with pulsatile lavage. Cement is mixed and once ready for implantation, the size 4 tibial implant, size  3 posterior stabilized femoral component, and the size 35 patella are cemented in place and the patella is held with the clamp. The trial insert is placed and the knee held in full extension. The Exparel (20 ml mixed with 60 ml saline) is injected into the extensor mechanism, posterior capsule, medial and lateral gutters and subcutaneous tissues.  All extruded cement is removed and once the cement is hard the permanent 12.5 mm posterior stabilized rotating platform insert is placed into the tibial tray.      The wound is copiously irrigated with saline solution and the extensor mechanism closed with #1 V-loc suture. The tourniquet is released for a total tourniquet time of 30  minutes. Flexion against gravity is 140 degrees and the patella tracks normally. Subcutaneous tissue is closed with 2.0 vicryl and subcuticular with running 4.0 Monocryl. The incision is cleaned and dried and steri-strips and a bulky sterile dressing are applied. The limb is placed into a knee immobilizer and the patient is awakened and transported to recovery in stable condition.      Please note that a surgical assistant was a medical necessity for this procedure in order to perform it in a safe and expeditious manner. Surgical assistant was necessary to retract the ligaments and vital neurovascular structures to prevent injury to them and also necessary for proper positioning of the limb to allow for anatomic placement of the prosthesis.   Margaret Plover Riva Sesma, MD    04/29/2020, 9:21 AM

## 2020-04-29 NOTE — Anesthesia Preprocedure Evaluation (Addendum)
Anesthesia Evaluation  Patient identified by MRN, date of birth, ID band Patient awake    Reviewed: Allergy & Precautions, NPO status , Patient's Chart, lab work & pertinent test results  Airway Mallampati: I  TM Distance: >3 FB Neck ROM: Full    Dental   Pulmonary former smoker,    Pulmonary exam normal        Cardiovascular hypertension, Pt. on medications Normal cardiovascular exam     Neuro/Psych Depression    GI/Hepatic   Endo/Other    Renal/GU      Musculoskeletal   Abdominal   Peds  Hematology   Anesthesia Other Findings   Reproductive/Obstetrics                             Anesthesia Physical Anesthesia Plan  ASA: III  Anesthesia Plan: General   Post-op Pain Management:    Induction: Intravenous  PONV Risk Score and Plan: 3 and Ondansetron, Dexamethasone and Treatment may vary due to age or medical condition  Airway Management Planned: LMA  Additional Equipment:   Intra-op Plan:   Post-operative Plan: Extubation in OR  Informed Consent: I have reviewed the patients History and Physical, chart, labs and discussed the procedure including the risks, benefits and alternatives for the proposed anesthesia with the patient or authorized representative who has indicated his/her understanding and acceptance.       Plan Discussed with: CRNA and Surgeon  Anesthesia Plan Comments:         Anesthesia Quick Evaluation

## 2020-04-29 NOTE — Anesthesia Procedure Notes (Signed)
Performed by: Shemicka Cohrs G, CRNA       

## 2020-04-29 NOTE — Interval H&P Note (Signed)
History and Physical Interval Note:  04/29/2020 6:31 AM  [name]  has presented today for surgery, with the diagnosis of Right knee osteoarthritis.  The various methods of treatment have been discussed with the patient and family. After consideration of risks, benefits and other options for treatment, the patient has consented to  Procedure(s) with comments: TOTAL KNEE ARTHROPLASTY (Right) - 67min as a surgical intervention.  The patient's history has been reviewed, patient examined, no change in status, stable for surgery.  I have reviewed the patient's chart and labs.  Questions were answered to the patient's satisfaction.     Pilar Plate Danee Soller

## 2020-04-29 NOTE — Anesthesia Procedure Notes (Signed)
Anesthesia Regional Block: Adductor canal block   Pre-Anesthetic Checklist: ,, timeout performed, Correct Patient, Correct Site, Correct Laterality, Correct Procedure, Correct Position, site marked, Risks and benefits discussed,  Surgical consent,  Pre-op evaluation,  At surgeon's request and post-op pain management  Laterality: Right  Prep: chloraprep       Needles:  Injection technique: Single-shot  Needle Type: Echogenic Stimulator Needle     Needle Length: 9cm  Needle Gauge: 21     Additional Needles:   Narrative:  Start time: 04/29/2020 7:43 AM End time: 04/29/2020 7:53 AM Injection made incrementally with aspirations every 5 mL.  Performed by: Personally  Anesthesiologist: Lillia Abed, MD  Additional Notes: Monitors applied. Patient sedated. Sterile prep and drape,hand hygiene and sterile gloves were used. Relevant anatomy identified.Needle position confirmed.Local anesthetic injected incrementally after negative aspiration. Local anesthetic spread visualized around nerve(s). Vascular puncture avoided. No complications. Image printed for medical record.The patient tolerated the procedure well.    Lillia Abed MD

## 2020-04-29 NOTE — Transfer of Care (Signed)
Immediate Anesthesia Transfer of Care Note  Patient: [name]  Procedure(s) Performed: TOTAL KNEE ARTHROPLASTY (Right Knee)  Patient Location: PACU  Anesthesia Type:General  Level of Consciousness: drowsy, patient cooperative and responds to stimulation  Airway & Oxygen Therapy: Patient Spontanous Breathing and Patient connected to face mask oxygen  Post-op Assessment: Report given to RN and Post -op Vital signs reviewed and stable  Post vital signs: Reviewed and stable  Last Vitals:  Vitals Value Taken Time  BP 149/89 04/29/20 0940  Temp    Pulse 91 04/29/20 0942  Resp 10 04/29/20 0942  SpO2 99 % 04/29/20 0942  Vitals shown include unvalidated device data.  Last Pain:  Vitals:   04/29/20 0649  TempSrc: Oral      Patients Stated Pain Goal: 4 (37/00/52 5910)  Complications: No complications documented.

## 2020-04-30 ENCOUNTER — Encounter (HOSPITAL_COMMUNITY): Payer: Self-pay | Admitting: Orthopedic Surgery

## 2020-04-30 DIAGNOSIS — M069 Rheumatoid arthritis, unspecified: Secondary | ICD-10-CM | POA: Diagnosis present

## 2020-04-30 DIAGNOSIS — Z8041 Family history of malignant neoplasm of ovary: Secondary | ICD-10-CM | POA: Diagnosis not present

## 2020-04-30 DIAGNOSIS — Z96652 Presence of left artificial knee joint: Secondary | ICD-10-CM | POA: Diagnosis present

## 2020-04-30 DIAGNOSIS — Z8249 Family history of ischemic heart disease and other diseases of the circulatory system: Secondary | ICD-10-CM | POA: Diagnosis not present

## 2020-04-30 DIAGNOSIS — M1711 Unilateral primary osteoarthritis, right knee: Secondary | ICD-10-CM | POA: Diagnosis present

## 2020-04-30 DIAGNOSIS — Z7952 Long term (current) use of systemic steroids: Secondary | ICD-10-CM | POA: Diagnosis not present

## 2020-04-30 DIAGNOSIS — Z20822 Contact with and (suspected) exposure to covid-19: Secondary | ICD-10-CM | POA: Diagnosis present

## 2020-04-30 DIAGNOSIS — Z808 Family history of malignant neoplasm of other organs or systems: Secondary | ICD-10-CM | POA: Diagnosis not present

## 2020-04-30 DIAGNOSIS — M179 Osteoarthritis of knee, unspecified: Secondary | ICD-10-CM | POA: Diagnosis present

## 2020-04-30 DIAGNOSIS — Z79899 Other long term (current) drug therapy: Secondary | ICD-10-CM | POA: Diagnosis not present

## 2020-04-30 LAB — CBC
HCT: 33.3 % — ABNORMAL LOW (ref 36.0–46.0)
Hemoglobin: 10.4 g/dL — ABNORMAL LOW (ref 12.0–15.0)
MCH: 31.2 pg (ref 26.0–34.0)
MCHC: 31.2 g/dL (ref 30.0–36.0)
MCV: 100 fL (ref 80.0–100.0)
Platelets: 155 10*3/uL (ref 150–400)
RBC: 3.33 MIL/uL — ABNORMAL LOW (ref 3.87–5.11)
RDW: 12.2 % (ref 11.5–15.5)
WBC: 11.3 10*3/uL — ABNORMAL HIGH (ref 4.0–10.5)
nRBC: 0 % (ref 0.0–0.2)

## 2020-04-30 LAB — BASIC METABOLIC PANEL
Anion gap: 7 (ref 5–15)
BUN: 14 mg/dL (ref 8–23)
CO2: 26 mmol/L (ref 22–32)
Calcium: 8.8 mg/dL — ABNORMAL LOW (ref 8.9–10.3)
Chloride: 107 mmol/L (ref 98–111)
Creatinine, Ser: 0.7 mg/dL (ref 0.44–1.00)
GFR calc Af Amer: >60 mL/min (ref >60)
GFR calc non Af Amer: >60 mL/min (ref >60)
Glucose, Bld: 120 mg/dL — ABNORMAL HIGH (ref 70–99)
Potassium: 4.5 mmol/L (ref 3.5–5.1)
Sodium: 140 mmol/L (ref 135–145)

## 2020-04-30 MED ORDER — TRAMADOL HCL 50 MG PO TABS: 50.0000 mg | ORAL_TABLET | Freq: Four times a day (QID) | ORAL | 0 refills | Status: DC | PRN

## 2020-04-30 MED ORDER — ASPIRIN 325 MG PO TBEC: 325.0000 mg | DELAYED_RELEASE_TABLET | Freq: Two times a day (BID) | ORAL | 0 refills | Status: AC

## 2020-04-30 MED ORDER — OXYCODONE HCL 5 MG PO TABS: 5.0000 mg | ORAL_TABLET | Freq: Four times a day (QID) | ORAL | 0 refills | Status: DC | PRN

## 2020-04-30 NOTE — TOC Initial Note (Signed)
Transition of Care [facility]) - Initial/Assessment Note    Patient Details  Name: [name] MRN: [id number] Date of Birth: Feb 17, 1941  [facility]) CM/SW Contact:    Lennart Pall, LCSW Phone Number: 04/30/2020, 2:10 PM  Clinical Narrative:     Met with pt and spouse to review potential dc needs.  Pt reports that they live in independent apt at [facility] in [place] and she is hopeful to return directly back there.  She know she has option for SNF/short term rehab at [facility] if this is needed/ recommended.  She was receiving HHPT at her apartment PTA and plans to resume these services upon return.  Have reached out to [place] contacts to let them know I am following if they need anything from the hospital.  Will continue to monitor for dc needs.              Expected Discharge Plan: [place] (*[place] apt at [facility]) Barriers to Discharge: Continued Medical Work up   Patient Goals and CMS Choice        Expected Discharge Plan and Services Expected Discharge Plan: [place] (*[place] apt at [place]) In-house Referral: Clinical Social Work   Post Acute Care Choice: [place] arrangements for the past 2 months: [place] Expected Discharge Date: 04/30/20               DME Arranged: N/A DME Agency: NA                  Prior Living Arrangements/Services Living arrangements for the past 2 months: [place] Lives with:: Spouse Patient language and need for interpreter reviewed:: Yes Do you feel safe going back to the place where you live?: Yes      Need for Family Participation in Patient Care: Yes (Comment) Care giver support system in place?: Yes (comment) Current home services: Home PT, DME Criminal Activity/Legal Involvement Pertinent to Current Situation/Hospitalization: No - Comment as needed  Activities of Daily Living Home Assistive Devices/Equipment: Gilford Rile (specify type) ADL  Screening (condition at time of admission) Patient's cognitive ability adequate to safely complete daily activities?: Yes Is the patient deaf or have difficulty hearing?: No Does the patient have difficulty seeing, even when wearing glasses/contacts?: No Does the patient have difficulty concentrating, remembering, or making decisions?: No Patient able to express need for assistance with ADLs?: No Does the patient have difficulty dressing or bathing?: Yes Independently performs ADLs?: Yes (appropriate for developmental age) Does the patient have difficulty walking or climbing stairs?: No Weakness of Legs: Both Weakness of Arms/Hands: None  Permission Sought/Granted Permission sought to share information with : Facility Art therapist granted to share information with : Yes, Verbal Permission Granted              Emotional Assessment Appearance:: Appears stated age Attitude/Demeanor/Rapport: Gracious Affect (typically observed): Accepting, Adaptable Orientation: : Oriented to Self, Oriented to Place, Oriented to  Time, Oriented to Situation Alcohol / Substance Use: Not Applicable Psych Involvement: No (comment)  Admission diagnosis:  Primary osteoarthritis of right knee [M17.11] Patient Active Problem List   Diagnosis Date Noted  . OA (osteoarthritis) of knee 04/29/2020  . Primary osteoarthritis of right knee 04/29/2020  . 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 Vertigo 02/19/2015  . Peripheral neuropathy 11/20/2013  . Abnormality of gait 04/17/2013  . Occipital neuralgia 04/17/2013  . Headache 10/17/2012  . Cervical spondylosis without myelopathy 10/17/2012  .  Osteomyelitis of left knee region [facility]) 02/27/2011   PCP:  Javier Glazier, MD Pharmacy:   [facility] [place], [address] AT [address] [place] [address] Phone: [phone] Fax: [phone]  [facility] ([place]) [place], [address] [place] [address] Phone: [phone] Fax: [phone]  [facility] [address], [place] [address] Phone: [phone] Fax: [phone]     Social Determinants of Health (SDOH) Interventions    Readmission Risk Interventions No flowsheet data found.

## 2020-04-30 NOTE — Plan of Care (Signed)
  Problem: Clinical Measurements: Goal: Respiratory complications will improve Outcome: Progressing   Problem: Clinical Measurements: Goal: Cardiovascular complication will be avoided Outcome: Progressing   Problem: Pain Managment: Goal: General experience of comfort will improve Outcome: Progressing   Problem: Elimination: Goal: Will not experience complications related to urinary retention Outcome: Progressing   Problem: Activity: Goal: Ability to avoid complications of mobility impairment will improve Outcome: Progressing   Problem: Activity: Goal: Range of joint motion will improve Outcome: Progressing   Problem: Skin Integrity: Goal: Will show signs of wound healing Outcome: Progressing

## 2020-04-30 NOTE — Progress Notes (Signed)
Physical Therapy Treatment Patient Details Name: [name] MRN: [id number] DOB: 1941/08/06 Today's Date: 04/30/2020    History of Present Illness Patient is 79 y.o. female s/p Rt TKA on 04/29/20 with PMH significant for vertigo, RA, OA, HTN, HLD, depression, VP shunt, lumbar surgery.    PT Comments    Pt progressing slowly. Multiple attempts to maintain standing d/t knee buckling.  With incr time able to amb ~20' with min/mod assist.  Follow Up Recommendations  Follow surgeon's recommendation for DC plan and follow-up therapies     Equipment Recommendations  None recommended by PT    Recommendations for Other Services       Precautions / Restrictions Precautions Precautions: Fall Precaution Comments: knee buckling in standing, no KI in room, called ortho tech, KI to room--pt husband stated he would go and get KI from home and bring it to the pt Required Braces or Orthoses: Knee Immobilizer - Right Knee Immobilizer - Right: Discontinue once straight leg raise with < 10 degree lag Restrictions Weight Bearing Restrictions: No Other Position/Activity Restrictions: WBAT    Mobility  Bed Mobility               General bed mobility comments: in chair   Transfers Overall transfer level: Needs assistance Equipment used: Rolling walker (2 wheeled) Transfers: Sit to/from Stand Sit to Stand: Min assist;Mod assist         General transfer comment: cues for safe technique, episode of knee buckling and  pt sat back on chair without warning.  assisted to stand a second time, pt able to achieve midline with incr time and cues (initial posterior LOB).    Ambulation/Gait Ambulation/Gait assistance: Min assist;Mod assist Gait Distance (Feet): 20 Feet Assistive device: Rolling walker (2 wheeled) Gait Pattern/deviations: Step-to pattern;Decreased stance time - right Gait velocity: decr   General Gait Details: cues for sequence, RW position. incr time needed. positions RLE in  ER throughout gait cycle   Stairs             Wheelchair Mobility    Modified Rankin (Stroke Patients Only)       Balance     Sitting balance-Leahy Scale: Good     Standing balance support: During functional activity;Bilateral upper extremity supported Standing balance-Leahy Scale: Poor                              Cognition Arousal/Alertness: Awake/alert Behavior During Therapy: WFL for tasks assessed/performed Overall Cognitive Status: Impaired/Different from baseline Area of Impairment: Problem solving;Following commands                       Following Commands: Follows one step commands with increased time;Follows multi-step commands inconsistently     Problem Solving: Slow processing;Decreased initiation;Difficulty sequencing;Requires verbal cues;Requires tactile cues General Comments: possibly related to meds      Exercises Total Joint Exercises Ankle Circles/Pumps: AROM;Both;10 reps Quad Sets: AROM;Right;10 reps    General Comments        Pertinent Vitals/Pain Pain Assessment: 0-10 Pain Score: 8  Pain Location: Rt Knee Pain Descriptors / Indicators: Aching;Burning;Discomfort Pain Intervention(s): Limited activity within patient's tolerance;Monitored during session;Premedicated before session;Repositioned;Ice applied    Home Living                      Prior Function            PT Goals (current goals can now  be found in the care plan section) Acute Rehab PT Goals Patient Stated Goal: go back home PT Goal Formulation: With patient Time For Goal Achievement: 05/06/20 Potential to Achieve Goals: Good Progress towards PT goals: Progressing toward goals    Frequency    7X/week      PT Plan Current plan remains appropriate    Co-evaluation              AM-PAC PT "6 Clicks" Mobility   Outcome Measure  Help needed turning from your back to your side while in a flat bed without using bedrails?: A  Little Help needed moving from lying on your back to sitting on the side of a flat bed without using bedrails?: A Little Help needed moving to and from a bed to a chair (including a wheelchair)?: A Little Help needed standing up from a chair using your arms (e.g., wheelchair or bedside chair)?: A Little Help needed to walk in hospital room?: A Little Help needed climbing 3-5 steps with a railing? : A Little 6 Click Score: 18    End of Session Equipment Utilized During Treatment: Gait belt Activity Tolerance: Patient tolerated treatment well Patient left: in chair;with call bell/phone within reach;with chair alarm set;with family/visitor present Nurse Communication: Mobility status PT Visit Diagnosis: Muscle weakness (generalized) (M62.81);Difficulty in walking, not elsewhere classified (R26.2)     Time: 7001-7494 PT Time Calculation (min) (ACUTE ONLY): 16 min  Charges:  $Gait Training: 8-22 mins                     Baxter Flattery, PT  Acute Rehab Dept ([place]) [phone] Pager [phone]  04/30/2020    [facility] 04/30/2020, 11:19 AM

## 2020-04-30 NOTE — Progress Notes (Signed)
04/30/20 1400  PT Visit Information  Last PT Received On 04/30/20  Progressing toward goals, incr gait distance this pm, continues to require mod assist to stand and safely achieve midline/ standing balance. Continue PT POC  Assistance Needed +1  History of Present Illness Patient is 79 y.o. female s/p Rt TKA on 04/29/20 with PMH significant for vertigo, RA, OA, HTN, HLD, depression, VP shunt, lumbar surgery.  Subjective Data  Patient Stated Goal go back home  Precautions  Precautions Fall  Precaution Comments knee buckling in standing, no KI in room, called ortho tech, KI to room--pt husband stated he would go and get KI from home and bring it to the pt  Required Braces or Orthoses Knee Immobilizer - Right  Knee Immobilizer - Right Discontinue once straight leg raise with < 10 degree lag  Restrictions  Weight Bearing Restrictions No  Other Position/Activity Restrictions WBAT  Pain Assessment  Pain Assessment 0-10  Pain Score 6  Pain Location Rt Knee  Pain Descriptors / Indicators Aching;Burning;Discomfort  Pain Intervention(s) Limited activity within patient's tolerance;Monitored during session;Premedicated before session;Repositioned  Cognition  Arousal/Alertness Awake/alert  Behavior During Therapy WFL for tasks assessed/performed  Overall Cognitive Status Impaired/Different from baseline  Area of Impairment Problem solving;Following commands  Following Commands Follows one step commands with increased time;Follows multi-step commands inconsistently  Problem Solving Slow processing;Decreased initiation;Difficulty sequencing;Requires verbal cues;Requires tactile cues  General Comments possibly related to meds, improved from am  Bed Mobility  Overal bed mobility Needs Assistance  Bed Mobility Sit to Supine  Sit to supine Min assist;Mod assist  General bed mobility comments assist with LEs, cues for sequence  Transfers  Overall transfer level Needs assistance  Equipment used  Rolling walker (2 wheeled)  Transfers Sit to/from Stand  Sit to Stand Min assist;Mod assist  General transfer comment cues for safe technique, assist with anterior-superior wt shift, initial posterior LOB in standing  Ambulation/Gait  Ambulation/Gait assistance Min assist;Mod assist  Gait Distance (Feet) 40 Feet  Assistive device Rolling walker (2 wheeled)  Gait Pattern/deviations Step-to pattern;Decreased stance time - right  General Gait Details cues for sequence, RW position. incr time needed. positions RLE in ER throughout gait cycle  Gait velocity decr  Balance  Sitting balance-Leahy Scale Good  Standing balance support During functional activity;Bilateral upper extremity supported  Standing balance-Leahy Scale Poor (posterior LOB, reliant on UEs)  Total Joint Exercises  Ankle Circles/Pumps AROM;Both;10 reps  Quad Sets AROM;Right;10 reps  Heel Slides AROM;AAROM;Right;10 reps  Hip ABduction/ADduction AROM;AAROM;Right;10 reps  Straight Leg Raises AROM;Right;10 reps  Goniometric ROM grossly  6 to 65 degrees right knee flexion  PT - End of Session  Equipment Utilized During Treatment Gait belt;Right knee immobilizer  Activity Tolerance Patient tolerated treatment well  Patient left with family/visitor present;in bed;with call bell/phone within reach;with bed alarm set  Nurse Communication Mobility status   PT - Assessment/Plan  PT Plan Current plan remains appropriate  PT Visit Diagnosis Muscle weakness (generalized) (M62.81);Difficulty in walking, not elsewhere classified (R26.2)  PT Frequency (ACUTE ONLY) 7X/week  Follow Up Recommendations Follow surgeon's recommendation for DC plan and follow-up therapies  PT equipment None recommended by PT  AM-PAC PT "6 Clicks" Mobility Outcome Measure (Version 2)  Help needed turning from your back to your side while in a flat bed without using bedrails? 3  Help needed moving from lying on your back to sitting on the side of a flat bed  without using bedrails? 3  Help needed moving  to and from a bed to a chair (including a wheelchair)? 3  Help needed standing up from a chair using your arms (e.g., wheelchair or bedside chair)? 3  Help needed to walk in hospital room? 3  Help needed climbing 3-5 steps with a railing?  2  6 Click Score 17  Consider Recommendation of Discharge To: Home with [facility]  PT Goal Progression  Progress towards PT goals Progressing toward goals  Acute Rehab PT Goals  PT Goal Formulation With patient  Time For Goal Achievement 05/06/20  Potential to Achieve Goals Good  PT Time Calculation  PT Start Time (ACUTE ONLY) 1418  PT Stop Time (ACUTE ONLY) 1442  PT Time Calculation (min) (ACUTE ONLY) 24 min  PT General Charges  $$ ACUTE PT VISIT 1 Visit  PT Treatments  $Gait Training 8-22 mins  $Therapeutic Exercise 8-22 mins

## 2020-04-30 NOTE — Progress Notes (Signed)
° °  Subjective: 1 Day Post-Op Procedure(s) (LRB): TOTAL KNEE ARTHROPLASTY (Right) Patient reports pain as mild.   Patient seen in rounds by Dr. Wynelle Link. Patient is well, and has had no acute complaints or problems other than pain in the right knee. Denies chest pain, SOB, or calf pain. Voiding without difficulty. No issues overnight.  We will continue therapy today.   Objective: Vital signs in last 24 hours: Temp:  [97.4 F (36.3 C)-98.8 F (37.1 C)] 97.9 F (36.6 C) (07/20 0518) Pulse Rate:  [70-94] 88 (07/20 0518) Resp:  [9-21] 15 (07/20 0518) BP: (108-171)/(70-99) 133/83 (07/20 0518) SpO2:  [93 %-99 %] 98 % (07/20 0518) Weight:  [71 kg] 71 kg (07/19 1144)  Intake/Output from previous day:  Intake/Output Summary (Last 24 hours) at 04/30/2020 0728 Last data filed at 04/30/2020 0153 Gross per 24 hour  Intake 2300 ml  Output 925 ml  Net 1375 ml     Intake/Output this shift: No intake/output data recorded.  Labs: Recent Labs    04/30/20 0352  HGB 10.4*   Recent Labs    04/30/20 0352  WBC 11.3*  RBC 3.33*  HCT 33.3*  PLT 155   Recent Labs    04/30/20 0352  NA 140  K 4.5  CL 107  CO2 26  BUN 14  CREATININE 0.70  GLUCOSE 120*  CALCIUM 8.8*   No results for input(s): LABPT, INR in the last 72 hours.  Exam: General - Patient is Alert and Oriented Extremity - Neurologically intact Neurovascular intact Sensation intact distally Dorsiflexion/Plantar flexion intact Dressing - dressing C/D/I Motor Function - intact, moving foot and toes well on exam.   Past Medical History:  Diagnosis Date   Abnormality of gait 04/17/2013   Cervical spondylosis    Cervicogenic headache    Right occipital neuralgia   Depression    Dyslipidemia    Gait disorder    Hyperlipidemia    Hypertension    Normal pressure hydrocephalus (HCC)    Occipital neuralgia 04/17/2013   right   Osteomyelitis of left knee region [facility])    Rheumatoid arthritis(714.0)     Vertigo 02/19/2015    Assessment/Plan: 1 Day Post-Op Procedure(s) (LRB): TOTAL KNEE ARTHROPLASTY (Right) Principal Problem:   OA (osteoarthritis) of knee Active Problems:   Primary osteoarthritis of right knee  Estimated body mass index is 26.05 kg/m as calculated from the following:   Height as of this encounter: 5\' 5"  (1.651 m).   Weight as of this encounter: 71 kg. Advance diet Up with therapy D/C IV fluids   Patient's anticipated LOS is less than 2 midnights, meeting these requirements: - Lives within 1 hour of care - Has a competent adult at home to recover with post-op recover - NO history of  - Chronic pain requiring opioids  - Diabetes  - Coronary Artery Disease  - Heart failure  - Heart attack  - Stroke  - DVT/VTE  - Cardiac arrhythmia  - Respiratory Failure/COPD  - Renal failure  - Anemia  - Advanced Liver disease  DVT Prophylaxis - Aspirin Weight bearing as tolerated. Continue therapy.  Plan is to go Home after hospital stay. Possible discharge later today if progresses with therapy and meeting goals. Scheduled for outpatient PT at [facility] (independent living resident) Follow-up in the office August 3rd  The [place] was reviewed today (04/30/2020) prior to any opioid medications being prescribed to this patient.   Theresa Duty, PA-C Orthopedic Surgery [phone] 04/30/2020, 7:28 AM

## 2020-05-01 ENCOUNTER — Encounter (HOSPITAL_COMMUNITY): Payer: Self-pay | Admitting: Orthopedic Surgery

## 2020-05-01 LAB — BASIC METABOLIC PANEL
Anion gap: 9 (ref 5–15)
BUN: 13 mg/dL (ref 8–23)
CO2: 26 mmol/L (ref 22–32)
Calcium: 8.9 mg/dL (ref 8.9–10.3)
Chloride: 102 mmol/L (ref 98–111)
Creatinine, Ser: 0.72 mg/dL (ref 0.44–1.00)
GFR calc Af Amer: >60 mL/min (ref >60)
GFR calc non Af Amer: >60 mL/min (ref >60)
Glucose, Bld: 129 mg/dL — ABNORMAL HIGH (ref 70–99)
Potassium: 4.2 mmol/L (ref 3.5–5.1)
Sodium: 137 mmol/L (ref 135–145)

## 2020-05-01 LAB — CBC
HCT: 32.1 % — ABNORMAL LOW (ref 36.0–46.0)
Hemoglobin: 10.3 g/dL — ABNORMAL LOW (ref 12.0–15.0)
MCH: 31.8 pg (ref 26.0–34.0)
MCHC: 32.1 g/dL (ref 30.0–36.0)
MCV: 99.1 fL (ref 80.0–100.0)
Platelets: 143 10*3/uL — ABNORMAL LOW (ref 150–400)
RBC: 3.24 MIL/uL — ABNORMAL LOW (ref 3.87–5.11)
RDW: 12.5 % (ref 11.5–15.5)
WBC: 10.4 10*3/uL (ref 4.0–10.5)
nRBC: 0 % (ref 0.0–0.2)

## 2020-05-01 NOTE — Progress Notes (Signed)
Subjective: 2 Days Post-Op Procedure(s) (LRB): TOTAL KNEE ARTHROPLASTY (Right) Patient reports pain as mild.   Patient seen in rounds for Dr. Wynelle Link. Patient is well, and has had no acute complaints or problems other than discomfort in the right knee. Ambulated 20 feet with PT yesterday. No acute events overnight. Denies CP, SHOB, N/V.  We will continue therapy today.   Objective: Vital signs in last 24 hours: Temp:  [98 F (36.7 C)-99.1 F (37.3 C)] 99.1 F (37.3 C) (07/21 0509) Pulse Rate:  [83-104] 90 (07/21 0637) Resp:  [16-17] 17 (07/21 0509) BP: (130-185)/(67-81) 185/81 (07/21 0637) SpO2:  [90 %-95 %] 92 % (07/21 0509)  Intake/Output from previous day:  Intake/Output Summary (Last 24 hours) at 05/01/2020 0801 Last data filed at 05/01/2020 0745 Gross per 24 hour  Intake 1064.92 ml  Output 300 ml  Net 764.92 ml     Intake/Output this shift: Total I/O In: 120 [P.O.:120] Out: -   Labs: Recent Labs    04/30/20 0352 05/01/20 0320  HGB 10.4* 10.3*   Recent Labs    04/30/20 0352 05/01/20 0320  WBC 11.3* 10.4  RBC 3.33* 3.24*  HCT 33.3* 32.1*  PLT 155 143*   Recent Labs    04/30/20 0352 05/01/20 0320  NA 140 137  K 4.5 4.2  CL 107 102  CO2 26 26  BUN 14 13  CREATININE 0.70 0.72  GLUCOSE 120* 129*  CALCIUM 8.8* 8.9   No results for input(s): LABPT, INR in the last 72 hours.  Exam: General - Patient is Alert and Oriented Extremity - Neurologically intact Sensation intact distally Intact pulses distally Dorsiflexion/Plantar flexion intact Dressing - dressing C/D/I Motor Function - intact, moving foot and toes well on exam.   Past Medical History:  Diagnosis Date   Abnormality of gait 04/17/2013   Cervical spondylosis    Cervicogenic headache    Right occipital neuralgia   Depression    Dyslipidemia    Gait disorder    Hyperlipidemia    Hypertension    Normal pressure hydrocephalus (HCC)    Occipital neuralgia 04/17/2013   right    Osteomyelitis of left knee region [facility])    Rheumatoid arthritis(714.0)    Vertigo 02/19/2015    Assessment/Plan: 2 Days Post-Op Procedure(s) (LRB): TOTAL KNEE ARTHROPLASTY (Right) Principal Problem:   OA (osteoarthritis) of knee Active Problems:   Primary osteoarthritis of right knee   Osteoarthritis of knee  Estimated body mass index is 26.05 kg/m as calculated from the following:   Height as of this encounter: 5\' 5"  (1.651 m).   Weight as of this encounter: 71 kg. Advance diet Up with therapy  Anticipated LOS equal to or greater than 2 midnights due to - Age 80 and older with one or more of the following:  - Obesity  - Expected need for hospital services (PT, OT, Nursing) required for safe  discharge  - Anticipated need for postoperative skilled nursing care or inpatient rehab  - Active co-morbidities: None OR   - Unanticipated findings during/Post Surgery: None  - Patient is a high risk of re-admission due to: None    DVT Prophylaxis - Aspirin Weight bearing as tolerated.  Hemoglobin stable at 10.3 this AM. Plan is to go Home after hospital stay. Plan for discharge today following 1-2 sessions of therapy if she is progressing and meeting her goals. Scheduled for OPPT at [facility]. Follow up with Dr. Wynelle Link in 2 weeks.   Griffith Citron, PA-C Orthopedic Surgery (  [phone] 05/01/2020, 8:01 AM

## 2020-05-01 NOTE — Progress Notes (Addendum)
05/01/20 1500  PT Visit Information  Last PT Received On 05/01/20  Pt progressing. Continues to require min to mod assist with sit to stand transfers with initial posterior LOB upon standing. (amb 60' and 45' with min to min/guard assist today, no knee buckling but overall unsteady gait). Pt will need assist for mobility at home for safety and fall prevention.  TOC and family working on aligning assist to return to [place] with f/u [place] vs OPPT.  Continue PT in acute setting.  Assistance Needed +1  History of Present Illness Patient is 79 y.o. female s/p Rt TKA on 04/29/20 with PMH significant for vertigo, RA, OA, HTN, HLD, depression, VP shunt, lumbar surgery.  Subjective Data  Patient Stated Goal go back home  Precautions  Precautions Fall  Required Braces or Orthoses Knee Immobilizer - Right  Knee Immobilizer - Right Discontinue once straight leg raise with < 10 degree lag  Restrictions  Weight Bearing Restrictions No  Other Position/Activity Restrictions WBAT  Pain Assessment  Pain Assessment 0-10  Pain Score 4  Pain Location Rt Knee  Pain Descriptors / Indicators Aching;Burning;Discomfort  Pain Intervention(s) Limited activity within patient's tolerance;Monitored during session;Repositioned;Premedicated before session  Cognition  Arousal/Alertness Awake/alert  Behavior During Therapy WFL for tasks assessed/performed  Overall Cognitive Status Impaired/Different from baseline  Area of Impairment Problem solving;Following commands;Memory  Memory Decreased short-term memory  Following Commands Follows one step commands with increased time;Follows multi-step commands inconsistently  Problem Solving Slow processing;Decreased initiation;Difficulty sequencing;Requires verbal cues;Requires tactile cues  Bed Mobility  Sit to supine Min assist;Mod assist  General bed mobility comments assist with bil LEs on to bed, incr time, cues for sequencing task  Transfers  Overall transfer level Needs  assistance  Equipment used Rolling walker (2 wheeled)  Transfers Sit to/from Stand  Sit to Stand Min assist;Mod assist  General transfer comment multi-modal  cues for safe technique, assist with anterior-superior wt shift, initial posterior LOB in standing, incr time and assist to achieve midline  Ambulation/Gait  Ambulation/Gait assistance Min assist  Gait Distance (Feet) 45 Feet  Assistive device Rolling walker (2 wheeled)  Gait Pattern/deviations Step-to pattern;Decreased stance time - right  General Gait Details cues for sequence, RW position. incr time needed. positions RLE in ER throughout gait cycle  Gait velocity decr  Balance  Sitting balance-Leahy Scale Good  Standing balance support During functional activity;Bilateral upper extremity supported  Standing balance-Leahy Scale Poor (posterior LOB on initial standing, reliant on UEs)  Total Joint Exercises  Ankle Circles/Pumps AROM;Both;10 reps  Quad Sets AROM;Right;10 reps  Heel Slides AROM;AAROM;Right;10 reps  Hip ABduction/ADduction AROM;AAROM;Right;10 reps  Straight Leg Raises AROM;Right;10 reps;AAROM  Knee Flexion AAROM;Right;10 reps  PT - End of Session  Equipment Utilized During Treatment Gait belt;Right knee immobilizer  Activity Tolerance Patient tolerated treatment well  Patient left with call bell/phone within reach;in chair;with chair alarm set;with family/visitor present  Nurse Communication Mobility status   PT - Assessment/Plan  PT Plan Current plan remains appropriate  PT Visit Diagnosis Muscle weakness (generalized) (M62.81);Difficulty in walking, not elsewhere classified (R26.2)  PT Frequency (ACUTE ONLY) 7X/week  Follow Up Recommendations Follow surgeon's recommendation for DC plan and follow-up therapies;Supervision for mobility/OOB  PT equipment None recommended by PT  AM-PAC PT "6 Clicks" Mobility Outcome Measure (Version 2)  Help needed turning from your back to your side while in a flat bed without  using bedrails? 3  Help needed moving from lying on your back to sitting on the side  of a flat bed without using bedrails? 3  Help needed moving to and from a bed to a chair (including a wheelchair)? 3  Help needed standing up from a chair using your arms (e.g., wheelchair or bedside chair)? 3  Help needed to walk in hospital room? 3  Help needed climbing 3-5 steps with a railing?  2  6 Click Score 17  Consider Recommendation of Discharge To: Home with [facility]  PT Goal Progression  Progress towards PT goals Progressing toward goals  Acute Rehab PT Goals  PT Goal Formulation With patient  Time For Goal Achievement 05/06/20  Potential to Achieve Goals Good  PT Time Calculation  PT Start Time (ACUTE ONLY) 1430  PT Stop Time (ACUTE ONLY) 1456  PT Time Calculation (min) (ACUTE ONLY) 26 min  PT General Charges  $$ ACUTE PT VISIT 1 Visit  PT Treatments  $Gait Training 8-22 mins  $Therapeutic Exercise 8-22 mins

## 2020-05-01 NOTE — Progress Notes (Signed)
   05/01/20 0509  Vitals  Temp 99.1 F (37.3 C)  Temp Source Oral  BP (!) 184/77  MAP (mmHg) 106  BP Location Right Arm  BP Method Automatic  Patient Position (if appropriate) Lying  Pulse Rate (!) 104  Pulse Rate Source Monitor  Resp 17  MEWS COLOR  MEWS Score Color Green  Oxygen Therapy  SpO2 92 %  O2 Device Room Air  MEWS Score  MEWS Temp 0  MEWS Systolic 0  MEWS Pulse 1  MEWS RR 0  MEWS LOC 0  MEWS Score 1   Pt has high blood pressure, rechecked at 0637 and its 185/81 HR 90, pt has no c/o pain nor distress. Paged provider on call Dierdre Highman) and ordered to give blood pressure medicine NOW that is schedule at 1000. Will inform dayshift nurse for this reschedule.

## 2020-05-01 NOTE — Progress Notes (Signed)
Physical Therapy Treatment Patient Details Name: [name] MRN: [id number] DOB: 18-Sep-1941 Today's Date: 05/01/2020    History of Present Illness Patient is 79 y.o. female s/p Rt TKA on 04/29/20 with PMH significant for vertigo, RA, OA, HTN, HLD, depression, VP shunt, lumbar surgery.    PT Comments    Pt progressing. Emphasis on safety and correct technique with sit to stand transfers. Pt will need assist wit mobility at home. Pt/family leaning toward hiring assist and returning home with HHPT vs OPPT.   Follow Up Recommendations  Follow surgeon's recommendation for DC plan and follow-up therapies     Equipment Recommendations  None recommended by PT    Recommendations for Other Services       Precautions / Restrictions Precautions Precautions: Fall Required Braces or Orthoses: Knee Immobilizer - Right Knee Immobilizer - Right: Discontinue once straight leg raise with < 10 degree lag Restrictions Weight Bearing Restrictions: No Other Position/Activity Restrictions: WBAT    Mobility  Bed Mobility               General bed mobility comments: pt in chair on arrival   Transfers Overall transfer level: Needs assistance Equipment used: Rolling walker (2 wheeled) Transfers: Sit to/from Stand Sit to Stand: Min assist;Mod assist         General transfer comment: repeated x 3 for strengthening. multi-modal  cues for safe technique, assist with anterior-superior wt shift, initial posterior LOB in standing  Ambulation/Gait Ambulation/Gait assistance: Min assist;Mod assist Gait Distance (Feet): 60 Feet (10') Assistive device: Rolling walker (2 wheeled) Gait Pattern/deviations: Step-to pattern;Decreased stance time - right Gait velocity: decr   General Gait Details: cues for sequence, RW position. incr time needed. positions RLE in ER throughout gait cycle   Stairs             Wheelchair Mobility    Modified Rankin (Stroke Patients Only)       Balance      Sitting balance-Leahy Scale: Good     Standing balance support: During functional activity;Bilateral upper extremity supported Standing balance-Leahy Scale: Poor (posterior LOB on initial standing, reliant on UEs)                              Cognition Arousal/Alertness: Awake/alert Behavior During Therapy: WFL for tasks assessed/performed Overall Cognitive Status: Impaired/Different from baseline Area of Impairment: Problem solving;Following commands                       Following Commands: Follows one step commands with increased time;Follows multi-step commands inconsistently     Problem Solving: Slow processing;Decreased initiation;Difficulty sequencing;Requires verbal cues;Requires tactile cues        Exercises Total Joint Exercises Ankle Circles/Pumps: AROM;Both;10 reps Knee Flexion: AAROM;Right;10 reps Goniometric ROM: ~  6 to 75 degrees AAROM R knee     General Comments        Pertinent Vitals/Pain Pain Assessment: 0-10 Pain Score: 5  Pain Location: Rt Knee Pain Descriptors / Indicators: Aching;Burning;Discomfort Pain Intervention(s): Limited activity within patient's tolerance;Monitored during session;Premedicated before session;Repositioned    Home Living                      Prior Function            PT Goals (current goals can now be found in the care plan section) Acute Rehab PT Goals Patient Stated Goal: go back home PT Goal  Formulation: With patient Time For Goal Achievement: 05/06/20 Potential to Achieve Goals: Good Progress towards PT goals: Progressing toward goals    Frequency    7X/week      PT Plan Current plan remains appropriate    Co-evaluation              AM-PAC PT "6 Clicks" Mobility   Outcome Measure  Help needed turning from your back to your side while in a flat bed without using bedrails?: A Little Help needed moving from lying on your back to sitting on the side of a flat bed  without using bedrails?: A Little Help needed moving to and from a bed to a chair (including a wheelchair)?: A Little Help needed standing up from a chair using your arms (e.g., wheelchair or bedside chair)?: A Little Help needed to walk in hospital room?: A Little Help needed climbing 3-5 steps with a railing? : A Lot 6 Click Score: 17    End of Session Equipment Utilized During Treatment: Gait belt;Right knee immobilizer Activity Tolerance: Patient tolerated treatment well Patient left: with call bell/phone within reach;in chair;with chair alarm set;with family/visitor present Nurse Communication: Mobility status PT Visit Diagnosis: Muscle weakness (generalized) (M62.81);Difficulty in walking, not elsewhere classified (R26.2)     Time: 0017-4944 PT Time Calculation (min) (ACUTE ONLY): 31 min  Charges:  $Gait Training: 23-37 mins                     Baxter Flattery, PT  Acute Rehab Dept ([place]) [phone] Pager [phone]  05/01/2020    [facility] 05/01/2020, 1:28 PM

## 2020-05-02 LAB — CBC
HCT: 30 % — ABNORMAL LOW (ref 36.0–46.0)
Hemoglobin: 9.5 g/dL — ABNORMAL LOW (ref 12.0–15.0)
MCH: 31.9 pg (ref 26.0–34.0)
MCHC: 31.7 g/dL (ref 30.0–36.0)
MCV: 100.7 fL — ABNORMAL HIGH (ref 80.0–100.0)
Platelets: 143 10*3/uL — ABNORMAL LOW (ref 150–400)
RBC: 2.98 MIL/uL — ABNORMAL LOW (ref 3.87–5.11)
RDW: 12.9 % (ref 11.5–15.5)
WBC: 8 10*3/uL (ref 4.0–10.5)
nRBC: 0 % (ref 0.0–0.2)

## 2020-05-02 NOTE — TOC Transition Note (Signed)
Transition of Care [facility]) - CM/SW Discharge Note   Patient Details  Name: [name] MRN: [id number] Date of Birth: 09/30/1941  Transition of [facility]) CM/SW Contact:  Lennart Pall, LCSW Phone Number: 05/02/2020, 2:58 PM   Clinical Narrative:     Pt cleared for d/c today.  DME and HHPT referrals placed (see below).  No further TOC needs.  Final next level of care: [place] Barriers to Discharge: Barriers Resolved   Patient Goals and CMS Choice Patient states their goals for this hospitalization and ongoing recovery are:: go home CMS Medicare.gov Compare Post Acute Care list provided to:: Patient Choice offered to / list presented to : Patient, Spouse  Discharge Placement                       Discharge Plan and Services In-house Referral: Clinical Social Work   Post Acute Care Choice: Home Health          DME Arranged: Gilford Rile rolling DME Agency: [employer] Date DME Agency Contacted: 05/02/20 Time DME Agency Contacted: 23 Representative spoke with at DME Agency: [place]: PT [place]: Other - See comment (to be provided by [facility])        Social Determinants of Health (SDOH) Interventions     Readmission Risk Interventions No flowsheet data found.

## 2020-05-02 NOTE — TOC Progression Note (Signed)
Transition of Care [facility]) - Progression Note    Patient Details  Name: Jeannette Maddy MRN: [id number] Date of Birth: 1941/10/05  Transition of Care [facility]) CM/SW Contact  Lennart Pall, LCSW Phone Number: 05/02/2020, 10:26 AM  Clinical Narrative:    Met with pt, spouse and son this morning and all reporting plan is for [place] with private duty caregivers assisting.  Spouse has arranged this through [facility].  I did stress to all that therapies are recommending this assistance throughout the day.  Did touch base with Kandyce Rud at [facility] and shared tx recs with her as well.  She is following up again with the spouse to confirm hours needed.   Anticipate dc today and have alerted [facility].   Expected Discharge Plan: [place] (*[place] apt at [facility]) Barriers to Discharge: Continued Medical Work up  Expected Discharge Plan and Services Expected Discharge Plan: [place] (*[place] apt at [facility]) In-house Referral: Clinical Social Work   Post Acute Care Choice: [place] arrangements for the past 2 months: [place] Expected Discharge Date: 05/01/20               DME Arranged: N/A DME Agency: NA                   Social Determinants of Health (SDOH) Interventions    Readmission Risk Interventions No flowsheet data found.

## 2020-05-02 NOTE — Progress Notes (Signed)
   Subjective: 3 Days Post-Op Procedure(s) (LRB): TOTAL KNEE ARTHROPLASTY (Right) Patient reports pain as mild.   Patient seen in rounds by Dr. Wynelle Link. Patient is well, and has had no acute complaints or problems other than discomfort in the right knee. No acute events overnight. Denies CP, SHOB, N/V. Ambulated 45 feet with PT yesterday.  Plan is to go Home after hospital stay.  Objective: Vital signs in last 24 hours: Temp:  [98.1 F (36.7 C)-99.3 F (37.4 C)] 98.8 F (37.1 C) (07/22 0602) Pulse Rate:  [87-100] 87 (07/22 0602) Resp:  [16-18] 18 (07/22 0602) BP: (139-172)/(67-74) 171/71 (07/22 0602) SpO2:  [93 %-97 %] 93 % (07/22 0602)  Intake/Output from previous day:  Intake/Output Summary (Last 24 hours) at 05/02/2020 0748 Last data filed at 05/02/2020 0602 Gross per 24 hour  Intake 660 ml  Output 675 ml  Net -15 ml    Intake/Output this shift: No intake/output data recorded.  Labs: Recent Labs    04/30/20 0352 05/01/20 0320 05/02/20 0324  HGB 10.4* 10.3* 9.5*   Recent Labs    05/01/20 0320 05/02/20 0324  WBC 10.4 8.0  RBC 3.24* 2.98*  HCT 32.1* 30.0*  PLT 143* 143*   Recent Labs    04/30/20 0352 05/01/20 0320  NA 140 137  K 4.5 4.2  CL 107 102  CO2 26 26  BUN 14 13  CREATININE 0.70 0.72  GLUCOSE 120* 129*  CALCIUM 8.8* 8.9   No results for input(s): LABPT, INR in the last 72 hours.  Exam: General - Patient is Alert and Oriented Extremity - Neurologically intact Sensation intact distally Intact pulses distally Dorsiflexion/Plantar flexion intact Dressing/Incision - clean, dry, no drainage Motor Function - intact, moving foot and toes well on exam.   Past Medical History:  Diagnosis Date  . Abnormality of gait 04/17/2013  . Cervical spondylosis   . Cervicogenic headache    Right occipital neuralgia  . Depression   . Dyslipidemia   . Gait disorder   . Hyperlipidemia   . Hypertension   . Normal pressure hydrocephalus (HCC)   .  Occipital neuralgia 04/17/2013   right  . 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 Rheumatoid arthritis(714.0)   . Vertigo 02/19/2015    Assessment/Plan: 3 Days Post-Op Procedure(s) (LRB): TOTAL KNEE ARTHROPLASTY (Right) Principal Problem:   OA (osteoarthritis) of knee Active Problems:   Primary osteoarthritis of right knee   Osteoarthritis of knee  Estimated body mass index is 26.05 kg/m as calculated from the following:   Height as of this encounter: 5\' 5"  (1.651 m).   Weight as of this encounter: 71 kg. Advance diet Up with therapy D/C IV fluids  DVT Prophylaxis - Aspirin Weight-bearing as tolerated  Plan for likely discharge home today following 1-2 sessions of therapy as long as she is meeting her goals and safe for discharge. Follow up in the office in 2 weeks.   Griffith Citron, PA-C Orthopedic Surgery [phone] 05/02/2020, 7:48 AM

## 2020-05-02 NOTE — Progress Notes (Signed)
Physical Therapy Treatment Patient Details Name: [name] MRN: [id number] DOB: 04-09-1941 Today's Date: 05/02/2020    History of Present Illness Patient is 79 y.o. female s/p Rt TKA on 04/29/20 with PMH significant for vertigo, RA, OA, HTN, HLD, depression, VP shunt, lumbar surgery.    PT Comments    POD # 3 Pt's cognition has improved.  Assisted with amb.  General transfer comment: Still requires "hands on" Pysical Assist to safely rise from recliner.  Present with posterior lean and instability.  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  Pt tolerated an increased distance however still requires "hands on" Physical Assist due to instability and impaired ballance correctuion responce.  Advised family to be "hands on" with all transfers and all mobility. Pt plans to return to her [place] with spouse.  Pt will need 24/7 initial care/assist with all mobility and ADL's.    Follow Up Recommendations  Follow surgeons recommendation for DC plan and follow-up therapies;Supervision for mobility/OOB     Equipment Recommendations  Rolling walker with 5" wheels (pt stated her walker is over 59 years old and)    Recommendations for Other Services       Precautions / Restrictions Precautions Precautions: Fall Restrictions Weight Bearing Restrictions: No Other Position/Activity Restrictions: WBAT    Mobility  Bed Mobility               General bed mobility comments: OOB in recliner  Transfers Overall transfer level: Needs assistance Equipment used: Rolling walker (2 wheeled) Transfers: Sit to/from Stand Sit to Stand: Min assist;Mod assist Stand pivot transfers: Mod assist       General transfer comment: Still requires "hands on" Pysical Assist to  safely rise from recliner.  Present with posterior lean and instability.  Present with uncontrolled stand to sit and requires Physical Assist to prevent LOB during turns and backward gait.  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  Pt tolerated an increased distance however still requires "hands on" Physical Assist due to instability and impaired ballance correctuion responce.  Advised family to be "hands on" with all transfers and all mobility.   Stairs             Wheelchair Mobility    Modified Rankin (Stroke Patients Only)       Balance                                            Cognition Arousal/Alertness: Awake/alert Behavior During Therapy: WFL for tasks assessed/performed Overall Cognitive Status: Within Functional Limits for tasks assessed                                 General Comments: improved cognition  AxO x 3 Pleasant Retired Immunologist   Total Knee Replacement TE's  following HEP handout 10 reps B LE ankle pumps 05 reps towel squeezes 05 reps knee presses 05 reps heel slides  05 reps SAQ's 05 reps SLR's 05 reps ABD Educated on use of gait belt to assist with TE's Followed by ICE     General Comments        Pertinent Vitals/Pain Pain Assessment: 0-10 Pain Score: 8  Pain Location: Rt Knee Pain Descriptors / Indicators: Aching;Burning;Discomfort Pain Intervention(s): Monitored during session;Repositioned;Patient requesting pain meds-RN notified;Ice applied    Home Living                      Prior Function            PT Goals (current goals can now be found in the  care plan section) Progress towards PT goals: Progressing toward goals    Frequency    7X/week      PT Plan Current plan remains appropriate    Co-evaluation              AM-PAC PT "6 Clicks" Mobility   Outcome Measure  Help needed turning from your back to your side while in a flat bed without using bedrails?: A Little Help needed moving from lying on your back to sitting on the side of a flat bed without using bedrails?: A Little Help needed moving to and from a bed to a chair (including a wheelchair)?: A Little Help needed standing up from a chair using your arms (e.g., wheelchair or bedside chair)?: A Little Help needed to walk in hospital room?: A Little Help needed climbing 3-5 steps with a railing? : A Lot 6 Click Score: 17    End of Session Equipment Utilized During Treatment: Gait belt Activity Tolerance: Patient tolerated treatment well Patient left: with call bell/phone within reach;in chair;with chair alarm set;with family/visitor present Nurse Communication: Mobility status PT Visit Diagnosis: Muscle weakness (generalized) (M62.81);Difficulty in walking, not elsewhere classified (R26.2)     Time: 0930-1004 PT Time Calculation (min) (ACUTE ONLY): 34 min  Charges:  $Gait Training: 8-22 mins $Therapeutic Exercise: 8-22 mins                     Rica Koyanagi  PTA Acute  Rehabilitation Services Pager      [phone] Office      [phone]

## 2020-05-02 NOTE — Plan of Care (Signed)
Patient discharged in stable condition.

## 2020-05-08 NOTE — Discharge Summary (Signed)
Physician Discharge Summary   Patient ID: [name] MRN: [id number] DOB/AGE: [birth year] 80 y.o.  Admit date: 04/29/2020 Discharge date: 05/02/2020  Primary Diagnosis: Osteoarthritis Right knee(s)  Admission Diagnoses:  Past Medical History:  Diagnosis Date  . Abnormality of gait 04/17/2013  . Cervical spondylosis   . Cervicogenic headache    Right occipital neuralgia  . Depression   . Dyslipidemia   . Gait disorder   . Hyperlipidemia   . Hypertension   . Normal pressure hydrocephalus (HCC)   . Occipital neuralgia 04/17/2013   right  . 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 Rheumatoid arthritis(714.0)   . Vertigo 02/19/2015   Discharge Diagnoses:   Principal Problem:   OA (osteoarthritis) of knee Active Problems:   Primary osteoarthritis of right knee   Osteoarthritis of knee  Estimated body mass index is 26.05 kg/m as calculated from the following:   Height as of this encounter: 5\' 5"  (1.651 m).   Weight as of this encounter: 71 kg.  Procedure:  Procedure(s) (LRB): TOTAL KNEE ARTHROPLASTY (Right)   Consults: None  HPI: [name] is a 79 y.o. year old female with end stage OA of her right knee with progressively worsening pain and dysfunction. She has constant pain, with activity and at rest and significant functional deficits with difficulties even with ADLs. She has had extensive non-op management including analgesics, injections of cortisone and viscosupplements, and home exercise program, but remains in significant pain with significant dysfunction.Radiographs show bone on bone arthritis medial and patellofemoral. She presents now for right Total Knee Arthroplasty.    Laboratory Data: Admission on 04/29/2020, Discharged on 05/02/2020  Component Date Value Ref Range Status  . WBC 04/30/2020 11.3* 4.0 - 10.5 K/uL Final  . RBC 04/30/2020 3.33* 3.87 - 5.11 MIL/uL Final  . Hemoglobin 04/30/2020 10.4* 12.0 - 15.0 g/dL Final  . HCT 04/30/2020 33.3* 36 -  46 % Final  . MCV 04/30/2020 100.0  80.0 - 100.0 fL Final  . MCH 04/30/2020 31.2  26.0 - 34.0 pg Final  . MCHC 04/30/2020 31.2  30.0 - 36.0 g/dL Final  . RDW 04/30/2020 12.2  11.5 - 15.5 % Final  . Platelets 04/30/2020 155  150 - 400 K/uL Final  . nRBC 04/30/2020 0.0  0.0 - 0.2 % Final   Performed at [facility], [place] [address], [place], [place] [address]  . Sodium 04/30/2020 140  135 - 145 mmol/L Final  . Potassium 04/30/2020 4.5  3.5 - 5.1 mmol/L Final  . Chloride 04/30/2020 107  98 - 111 mmol/L Final  . CO2 04/30/2020 26  22 - 32 mmol/L Final  . Glucose, Bld 04/30/2020 120* 70 - 99 mg/dL Final   Glucose reference range applies only to samples taken after fasting for at least 8 hours.  . BUN 04/30/2020 14  8 - 23 mg/dL Final  . Creatinine, Ser 04/30/2020 0.70  0.44 - 1.00 mg/dL Final  . Calcium 04/30/2020 8.8* 8.9 - 10.3 mg/dL Final  . GFR calc non Af Amer 04/30/2020 >60  >60 mL/min Final  . GFR calc Af Amer 04/30/2020 >60  >60 mL/min Final  . Anion gap 04/30/2020 7  5 - 15 Final   Performed at [facility] [place] [address] [place], [place] [address]  . WBC 05/01/2020 10.4  4.0 - 10.5 K/uL Final  . RBC 05/01/2020 3.24* 3.87 - 5.11 MIL/uL Final  . Hemoglobin 05/01/2020 10.3* 12.0 - 15.0 g/dL Final  .  HCT 05/01/2020 32.1* 36 - 46 % Final  . MCV 05/01/2020 99.1  80.0 - 100.0 fL Final  . MCH 05/01/2020 31.8  26.0 - 34.0 pg Final  . MCHC 05/01/2020 32.1  30.0 - 36.0 g/dL Final  . RDW 05/01/2020 12.5  11.5 - 15.5 % Final  . Platelets 05/01/2020 143* 150 - 400 K/uL Final  . nRBC 05/01/2020 0.0  0.0 - 0.2 % Final   Performed at [facility], [place] [address], [place], [place] [address]  . Sodium 05/01/2020 137  135 - 145 mmol/L Final  . Potassium 05/01/2020 4.2  3.5 - 5.1 mmol/L Final  . Chloride 05/01/2020 102  98 - 111 mmol/L Final  . CO2 05/01/2020 26  22 - 32 mmol/L Final  . Glucose, Bld 05/01/2020 129* 70 - 99 mg/dL Final    Glucose reference range applies only to samples taken after fasting for at least 8 hours.  . BUN 05/01/2020 13  8 - 23 mg/dL Final  . Creatinine, Ser 05/01/2020 0.72  0.44 - 1.00 mg/dL Final  . Calcium 05/01/2020 8.9  8.9 - 10.3 mg/dL Final  . GFR calc non Af Amer 05/01/2020 >60  >60 mL/min Final  . GFR calc Af Amer 05/01/2020 >60  >60 mL/min Final  . Anion gap 05/01/2020 9  5 - 15 Final   Performed at [facility], [place] [address], [place], [place] [address]  . WBC 05/02/2020 8.0  4.0 - 10.5 K/uL Final  . RBC 05/02/2020 2.98* 3.87 - 5.11 MIL/uL Final  . Hemoglobin 05/02/2020 9.5* 12.0 - 15.0 g/dL Final  . HCT 05/02/2020 30.0* 36 - 46 % Final  . MCV 05/02/2020 100.7* 80.0 - 100.0 fL Final  . MCH 05/02/2020 31.9  26.0 - 34.0 pg Final  . MCHC 05/02/2020 31.7  30.0 - 36.0 g/dL Final  . RDW 05/02/2020 12.9  11.5 - 15.5 % Final  . Platelets 05/02/2020 143* 150 - 400 K/uL Final  . nRBC 05/02/2020 0.0  0.0 - 0.2 % Final   Performed at [facility], [place] [address], [place], [place] [address]  Hospital Outpatient Visit on 04/25/2020  Component Date Value Ref Range Status  . SARS Coronavirus 2 04/25/2020 NEGATIVE  NEGATIVE Final   Comment: (NOTE) SARS-CoV-2 target nucleic acids are NOT DETECTED.  The SARS-CoV-2 RNA is generally detectable in upper and lower respiratory specimens during the acute phase of infection. Negative results do not preclude SARS-CoV-2 infection, do not rule out co-infections with other pathogens, and should not be used as the sole basis for treatment or other patient management decisions. Negative results must be combined with clinical observations, patient history, and epidemiological information. The expected result is Negative.  Fact Sheet for Patients: SugarRoll.be  Fact Sheet for Healthcare Providers: https://www.woods-mathews.com/  This test is not yet approved or cleared by the  [place] FDA and  has been authorized for detection and/or diagnosis of SARS-CoV-2 by FDA under an Emergency Use Authorization (EUA). This EUA will remain  in effect (meaning this test can be used) for the duration of the COVID-19 declaration under Se                          ction 564(b)(1) of the Act, 21 U.S.C. section 360bbb-3(b)(1), unless the authorization is terminated or revoked sooner.  Performed at [facility], [place] [address], [place],  [address]   Hospital Outpatient Visit on 04/23/2020  Component Date Value Ref Range Status  . aPTT 04/23/2020 27  24 - 36 seconds Final   Performed at [facility], [place] [address], [place], [place] [address]  . WBC 04/23/2020 7.2  4.0 - 10.5 K/uL Final  . RBC 04/23/2020 4.08  3.87 - 5.11 MIL/uL Final  . Hemoglobin 04/23/2020 13.0  12.0 - 15.0 g/dL Final  . HCT 04/23/2020 40.6  36 - 46 % Final  . MCV 04/23/2020 99.5  80.0 - 100.0 fL Final  . MCH 04/23/2020 31.9  26.0 - 34.0 pg Final  . MCHC 04/23/2020 32.0  30.0 - 36.0 g/dL Final  . RDW 04/23/2020 12.5  11.5 - 15.5 % Final  . Platelets 04/23/2020 171  150 - 400 K/uL Final  . nRBC 04/23/2020 0.0  0.0 - 0.2 % Final   Performed at [facility], [place] [address], [place], [place] [address]  . Sodium 04/23/2020 138  135 - 145 mmol/L Final  . Potassium 04/23/2020 4.7  3.5 - 5.1 mmol/L Final  . Chloride 04/23/2020 103  98 - 111 mmol/L Final  . CO2 04/23/2020 29  22 - 32 mmol/L Final  . Glucose, Bld 04/23/2020 115* 70 - 99 mg/dL Final   Glucose reference range applies only to samples taken after fasting for at least 8 hours.  . BUN 04/23/2020 16  8 - 23 mg/dL Final  . Creatinine, Ser 04/23/2020 0.95  0.44 - 1.00 mg/dL Final  . Calcium 04/23/2020 9.7  8.9 - 10.3 mg/dL Final  . Total Protein 04/23/2020 6.9  6.5 - 8.1 g/dL Final  . Albumin 04/23/2020 4.1  3.5 - 5.0 g/dL Final  . AST 04/23/2020 20  15 - 41 U/L Final  . ALT 04/23/2020 13  0 - 44  U/L Final  . Alkaline Phosphatase 04/23/2020 53  38 - 126 U/L Final  . Total Bilirubin 04/23/2020 0.9  0.3 - 1.2 mg/dL Final  . GFR calc non Af Amer 04/23/2020 57* >60 mL/min Final  . GFR calc Af Amer 04/23/2020 >60  >60 mL/min Final  . Anion gap 04/23/2020 6  5 - 15 Final   Performed at [facility], [place] [address], [place], [place] [address]  . Prothrombin Time 04/23/2020 12.2  11.4 - 15.2 seconds Final  . INR 04/23/2020 0.9  0.8 - 1.2 Final   Comment: (NOTE) INR goal varies based on device and disease states. Performed at [facility], [place] [address], [place], [place] [address]   . ABO/RH(D) 04/23/2020 A NEG   Final  . Antibody Screen 04/23/2020 NEG   Final  . Sample Expiration 04/23/2020 05/02/2020,2359   Final  . Extend sample reason 04/23/2020    Final                   Value:NO TRANSFUSIONS OR PREGNANCY IN THE PAST 3 MONTHS Performed at [facility], [place] [address] [place], [place] [address]   . MRSA, PCR 04/23/2020 NEGATIVE  NEGATIVE Final  . Staphylococcus aureus 04/23/2020 NEGATIVE  NEGATIVE Final   Comment: (NOTE) The Xpert SA Assay (FDA approved for NASAL specimens in patients 1 years of age and older), is one component of a comprehensive surveillance program. It is not intended to diagnose infection nor to guide or monitor treatment. Performed at [facility], [place] [address] [place], [place] [address]      X-Rays:No results found.  EKG: Orders placed or performed during the hospital  encounter of 04/23/20  . EKG 12 lead per protocol  . EKG 12 lead per protocol     Hospital Course: [name] is a 79 y.o. who was admitted to [facility]. They were brought to the operating room on 04/29/2020 and underwent Procedure(s): TOTAL KNEE ARTHROPLASTY.  Patient tolerated the procedure well and was later transferred to the recovery room and then to the orthopaedic floor for  postoperative care. They were given PO and IV analgesics for pain control following their surgery. They were given 24 hours of postoperative antibiotics of  Anti-infectives (From admission, onward)   Start     Dose/Rate Route Frequency Ordered Stop   04/29/20 1430  ceFAZolin (ANCEF) IVPB 2g/100 mL premix        2 g 200 mL/hr over 30 Minutes Intravenous Every 6 hours 04/29/20 1144 04/29/20 2004   04/29/20 0600  ceFAZolin (ANCEF) IVPB 2g/100 mL premix        2 g 200 mL/hr over 30 Minutes Intravenous On call to O.R. 04/29/20 1610 04/29/20 9604     and started on DVT prophylaxis in the form of Aspirin.   PT and OT were ordered for total joint protocol. Discharge planning consulted to help with postop disposition and equipment needs. Patient had a good night on the evening of surgery. They started to get up OOB with therapy on POD #0. She was progressing with PT on POD #1. Continued to work with therapy into POD #2.  Pt was seen during rounds on day three and was ready to go home pending progress with therapy. Pt worked with therapy for one additional session and was meeting their goals. She was discharged to home later that day in stable condition.  Diet: Regular diet Activity: WBAT Follow-up: in 2 weeks Disposition: Home Discharged Condition: good   Discharge Instructions    Call MD / Call 911   Complete by: As directed    If you experience chest pain or shortness of breath, CALL 911 and be transported to the hospital emergency room.  If you develope a fever above 101 F, pus (white drainage) or increased drainage or redness at the wound, or calf pain, call your surgeon's office.   Change dressing   Complete by: As directed    You may remove the bulky bandage (ACE wrap and gauze) two days after surgery. You will have an adhesive waterproof bandage underneath. Leave this in place until your first follow-up appointment.   Constipation Prevention   Complete by: As directed    Drink plenty of  fluids.  Prune juice may be helpful.  You may use a stool softener, such as Colace (over the counter) 100 mg twice a day.  Use MiraLax (over the counter) for constipation as needed.   Diet - low sodium heart healthy   Complete by: As directed    Do not put a pillow under the knee. Place it under the heel.   Complete by: As directed    Driving restrictions   Complete by: As directed    No driving for two weeks   TED hose   Complete by: As directed    Use stockings (TED hose) for three weeks on both leg(s).  You may remove them at night for sleeping.   Weight bearing as tolerated   Complete by: As directed      Allergies as of 05/02/2020      Reactions   Methotrexate Derivatives Other (See Comments)   Dangerously  decreased platelet count   Penicillins Itching   Tolerated Cephalosporin Date: 04/30/20.   Sulfa Antibiotics Itching      Medication List    STOP taking these medications   acetaminophen-codeine 300-30 MG tablet Commonly known as: TYLENOL #3     TAKE these medications   acetaminophen 500 MG tablet Commonly known as: TYLENOL Take 1,000 mg by mouth 3 (three) times daily as needed for mild pain.   alendronate 70 MG tablet Commonly known as: FOSAMAX Take 70 mg by mouth once a week.   amLODipine 5 MG tablet Commonly known as: NORVASC Take 5 mg by mouth daily.   aspirin 325 MG EC tablet Take 1 tablet (325 mg total) by mouth 2 (two) times daily for 20 days. Then take one 81 mg aspirin once a day for three weeks. Then discontinue aspirin.   CALCIUM SOFT CHEWS PO Take by mouth daily.   labetalol 100 MG tablet Commonly known as: NORMODYNE Take 100 mg by mouth 2 (two) times daily.   latanoprost 0.005 % ophthalmic solution Commonly known as: XALATAN Place 1 drop into both eyes at bedtime.   leflunomide 10 MG tablet Commonly known as: ARAVA Take 10 mg by mouth daily.   meclizine 25 MG tablet Commonly known as: ANTIVERT Take 1 tablet (25 mg total) by mouth 3  (three) times daily as needed for dizziness.   methocarbamol 750 MG tablet Commonly known as: Robaxin-750 Take 1 tablet (750 mg total) by mouth every 8 (eight) hours as needed for muscle spasms.   multivitamin tablet Take 1 tablet by mouth daily.   ondansetron 4 MG tablet Commonly known as: ZOFRAN Take 1 tablet (4 mg total) by mouth every 6 (six) hours. What changed:   when to take this  reasons to take this   oxyCODONE 5 MG immediate release tablet Commonly known as: Oxy IR/ROXICODONE Take 1-2 tablets (5-10 mg total) by mouth every 6 (six) hours as needed for severe pain.   predniSONE 1 MG tablet Commonly known as: DELTASONE Take 4 mg by mouth daily.   traMADol 50 MG tablet Commonly known as: ULTRAM Take 1-2 tablets (50-100 mg total) by mouth every 6 (six) hours as needed for moderate pain.   traZODone 50 MG tablet Commonly known as: DESYREL TAKE 1 TABLET BY MOUTH AT BEDTIME. GENERIC EQUIVALENT FOR DESYREL What changed:   how much to take  how to take this  when to take this  additional instructions            Discharge Care Instructions  (From admission, onward)         Start     Ordered   04/30/20 0000  Weight bearing as tolerated        04/30/20 0734   04/30/20 0000  Change dressing       Comments: You may remove the bulky bandage (ACE wrap and gauze) two days after surgery. You will have an adhesive waterproof bandage underneath. Leave this in place until your first follow-up appointment.   04/30/20 0734          Follow-up Information    Aluisio, Pilar Plate, MD. Schedule an appointment as soon as possible for a visit on 05/14/2020.   Specialty: Orthopedic Surgery Contact information: [address] [place] [address] [phone]               Signed: Griffith Citron, PA-C Orthopedic Surgery 05/08/2020, 9:06 AM

## 2020-10-31 ENCOUNTER — Other Ambulatory Visit: Payer: Self-pay | Admitting: Neurology

## 2020-11-07 ENCOUNTER — Other Ambulatory Visit: Payer: Self-pay | Admitting: Emergency Medicine

## 2020-11-07 MED ORDER — TRAZODONE HCL 50 MG PO TABS: 50.0000 mg | ORAL_TABLET | Freq: Every day | ORAL | 3 refills | Status: DC

## 2020-12-23 ENCOUNTER — Encounter: Payer: Self-pay | Admitting: Neurology

## 2020-12-23 ENCOUNTER — Ambulatory Visit (INDEPENDENT_AMBULATORY_CARE_PROVIDER_SITE_OTHER): Payer: Medicare Other | Admitting: Neurology

## 2020-12-23 VITALS — BP 139/73 | HR 68 | Ht 67.0 in | Wt 150.0 lb

## 2020-12-23 DIAGNOSIS — G609 Hereditary and idiopathic neuropathy, unspecified: Secondary | ICD-10-CM | POA: Diagnosis not present

## 2020-12-23 DIAGNOSIS — G912 (Idiopathic) normal pressure hydrocephalus: Secondary | ICD-10-CM | POA: Diagnosis not present

## 2020-12-23 DIAGNOSIS — R269 Unspecified abnormalities of gait and mobility: Secondary | ICD-10-CM

## 2020-12-23 MED ORDER — TRAMADOL HCL 50 MG PO TABS
50.0000 mg | ORAL_TABLET | Freq: Four times a day (QID) | ORAL | 0 refills | Status: DC | PRN
Start: 2020-12-23 — End: 2021-07-09

## 2020-12-23 NOTE — Progress Notes (Signed)
Reason for visit: Gait disorder  [name] is an 80 y.o. 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  The patient also has intermittent episodes of vertigo, she may have some dizziness lying down at night and dizziness when she gets up out of bed in the morning.  The patient had the surgery on the right since last seen, she continues to believe that her stamina with walking is slowly declining.  She has not had any falls, she walks with a walker.  She does have some chronic discomfort, she is followed through rheumatology and gets Tylenol 3 but she does not like using it because of its constipating tendencies.  She returns to the office today for further evaluation.  Past Medical History:  Diagnosis Date  . Abnormality of gait 04/17/2013  . Cervical spondylosis   . Cervicogenic headache    Right occipital neuralgia  . Depression   . Dyslipidemia   . Gait disorder   . Hyperlipidemia   . Hypertension   . Normal pressure hydrocephalus (HCC)   . Occipital neuralgia 04/17/2013   right  . Osteomyelitis of left knee region ([place])   . Rheumatoid arthritis(714.0)   . Vertigo 02/19/2015    Past Surgical History:  Procedure Laterality Date  . CERVICAL SPINE SURGERY    . JOINT REPLACEMENT    . LUMBAR SPINE SURGERY    . OVARY SURGERY    . REPLACEMENT TOTAL KNEE Left   . TOTAL KNEE ARTHROPLASTY Right 04/29/2020   Procedure: TOTAL KNEE ARTHROPLASTY;  Surgeon: [name], MD;  Location: [facility] ORS;  Service: Orthopedics;  Laterality: Right;  82mn  . VENTRICULO-PERITONEAL SHUNT PLACEMENT / LAPAROSCOPIC INSERTION PERITONEAL CATHETER      Family History  Problem Relation Age of Onset  . Ovarian cancer Mother   . Heart disease  Father   . Brain cancer Sister     Social history:  reports that she has quit smoking. She has never used smokeless tobacco. She reports previous alcohol use. She reports that she does not use drugs.    Allergies  Allergen Reactions  . Methotrexate Derivatives Other (See Comments)    Dangerously decreased platelet count  . Penicillins Itching    Tolerated Cephalosporin Date: 04/30/20.    . Sulfa Antibiotics Itching    Medications:  Prior to Admission medications   Medication Sig Start Date End Date Taking? Authorizing Provider  acetaminophen (TYLENOL) 500 MG tablet Take 1,000 mg by mouth 3 (three) times daily as needed for mild pain.    Yes [provider]  alendronate (FOSAMAX) 70 MG tablet Take 70 mg by mouth once a week. 01/12/20  Yes [provider]  amLODipine (NORVASC) 5 MG tablet Take 5 mg by mouth daily.   Yes [provider]  labetalol (NORMODYNE) 100 MG tablet Take 100 mg by mouth 2 (two) times daily.    Yes [provider]  latanoprost (XALATAN) 0.005 % ophthalmic solution Place 1 drop into both eyes at bedtime.  08/02/17  Yes [provider]  leflunomide (ARAVA) 10 MG tablet Take 10 mg by mouth daily.   Yes [provider]  meclizine (ANTIVERT) 25 MG tablet Take 1 tablet (25 mg total) by mouth 3 (three) times daily as needed for dizziness. 02/18/15  Yes  Quintella Reichert, MD  methocarbamol (ROBAXIN-750) 750 MG tablet Take 1 tablet (750 mg total) by mouth every 8 (eight) hours as needed for muscle spasms. 11/07/19  Yes Kathrynn Ducking, MD  Multiple Vitamin (MULTIVITAMIN) tablet Take 1 tablet by mouth daily.   Yes [provider]  ondansetron (ZOFRAN) 4 MG tablet Take 1 tablet (4 mg total) by mouth every 6 (six) hours. Patient taking differently: Take 4 mg by mouth every 8 (eight) hours as needed for nausea or vomiting. 02/18/15  Yes Quintella Reichert, MD  predniSONE (DELTASONE) 1 MG tablet Take 4 mg by mouth daily.     Yes [provider]  traMADol (ULTRAM) 50 MG tablet Take 1-2 tablets (50-100 mg total) by mouth every 6 (six) hours as needed for moderate pain. 04/30/20  Yes Edmisten, Kristie L, PA  Calcium-Vitamin D-Vitamin K (CALCIUM SOFT CHEWS PO) Take by mouth daily.     [provider]  oxyCODONE (OXY IR/ROXICODONE) 5 MG immediate release tablet Take 1-2 tablets (5-10 mg total) by mouth every 6 (six) hours as needed for severe pain. 04/30/20   Edmisten, Ok Anis, PA  traZODone (DESYREL) 50 MG tablet Take 1 tablet (50 mg total) by mouth at bedtime. 11/07/20   Kathrynn Ducking, MD    ROS:  Out of a complete 14 system review of symptoms, the patient complains only of the following symptoms, and all other reviewed systems are negative.  Walking difficulty Ankle swelling and discomfort Arthritis pain  Blood pressure 139/73, pulse 68, height '5\' 7"'$  (1.702 m), weight 150 lb (68 kg).  Physical Exam  General: The patient is alert and cooperative at the time of the examination.  Skin: No significant peripheral edema is noted.   Neurologic Exam  Mental status: The patient is alert and oriented x 3 at the time of the examination. The patient has apparent normal recent and remote memory, with an apparently normal attention span and concentration ability.   Cranial nerves: Facial symmetry is present. Speech is normal, no aphasia or dysarthria is noted. Extraocular movements are full. Visual fields are full.  Motor: The patient has good strength in the upper extremities, with the lower extremities she has significant weakness with hip flexion on the left, 4/5 strength on the right with hip flexion, a mild left foot drop.  Sensory examination: Soft touch sensation is symmetric on the face, arms, and legs.  Coordination: The patient has good finger-nose-finger bilaterally.  She has difficulty performing heel-to-shin on both sides, worse on the left.  Gait and station: The patient is able to  walk with a walker with a slow deliberate gait pattern, slightly wide-based.  Tandem gait was not attempted.  Reflexes: Deep tendon reflexes are symmetric.   Assessment/Plan:  1.  Multifactorial gait disorder  2.  History of normal pressure selfless  The patient is to engage in muscle toning exercises with the lower extremities.  She is to walk on a regular basis.  She wishes to try a small prescription for Ultram for her discomfort, she does not like using codeine.  If the Ultram is effective, she may continue to get the prescription filled through her rheumatologist in the future.  The patient will follow up here in 6 months.  Jill Alexanders MD 12/23/2020 12:40 PM  [facility] [address] [place], [place] [address]  Phone [phone] Fax [phone]

## 2021-01-30 ENCOUNTER — Other Ambulatory Visit: Payer: Self-pay | Admitting: Neurology

## 2021-02-03 ENCOUNTER — Other Ambulatory Visit: Payer: Self-pay | Admitting: Neurology

## 2021-02-06 ENCOUNTER — Other Ambulatory Visit: Payer: Self-pay | Admitting: Neurology

## 2021-02-10 ENCOUNTER — Other Ambulatory Visit: Payer: Self-pay | Admitting: Neurology

## 2021-07-09 ENCOUNTER — Ambulatory Visit (INDEPENDENT_AMBULATORY_CARE_PROVIDER_SITE_OTHER): Payer: Medicare Other | Admitting: Neurology

## 2021-07-09 ENCOUNTER — Encounter: Payer: Self-pay | Admitting: Neurology

## 2021-07-09 VITALS — BP 122/64 | HR 74 | Ht 67.0 in | Wt 162.0 lb

## 2021-07-09 DIAGNOSIS — G912 (Idiopathic) normal pressure hydrocephalus: Secondary | ICD-10-CM | POA: Diagnosis not present

## 2021-07-09 DIAGNOSIS — G609 Hereditary and idiopathic neuropathy, unspecified: Secondary | ICD-10-CM

## 2021-07-09 DIAGNOSIS — R269 Unspecified abnormalities of gait and mobility: Secondary | ICD-10-CM | POA: Diagnosis not present

## 2021-07-09 NOTE — Progress Notes (Signed)
Reason for visit: Gait disorder, normal pressure hydrocephalus, peripheral neuropathy  [name] is an 80 y.o. female  History of present illness:  [name] is an 80 year old right-handed white female with a history of normal pressure hydrocephalus, cervical spine disease, rheumatoid arthritis, peripheral neuropathy, and occasional vertigo.  The patient has a chronic gait disorder associated with the above, she reports no recent falls.  She uses a walker for ambulation.  She does report some gradually worsening fatigue issues, she has to nap on a daily basis after lunch.  She sleeps fairly well at night.  She reports no significant issues with pain, she claims that she takes Tylenol 3, at least 1 tablet daily.  She does not take Ultram at this point.  She may take trazodone at night for sleep.  She does report some urinary urgency at times.  She has undergone physical therapy in the past, she no longer is in physical therapy.  She does some minimal exercise during the day.  Past Medical History:  Diagnosis Date   Abnormality of gait 04/17/2013   Cervical spondylosis    Cervicogenic headache    Right occipital neuralgia   Depression    Dyslipidemia    Gait disorder    Hyperlipidemia    Hypertension    Normal pressure hydrocephalus (HCC)    Occipital neuralgia 04/17/2013   right   Osteomyelitis of left knee region [facility])    Rheumatoid arthritis(714.0)    Vertigo 02/19/2015    Past Surgical History:  Procedure Laterality Date   CERVICAL SPINE SURGERY     JOINT REPLACEMENT     LUMBAR SPINE SURGERY     OVARY SURGERY     REPLACEMENT TOTAL KNEE Left    TOTAL KNEE ARTHROPLASTY Right 04/29/2020   Procedure: TOTAL KNEE ARTHROPLASTY;  Surgeon: [name], MD;  Location: [facility];  Service: Orthopedics;  Laterality: Right;  68mn   VENTRICULO-PERITONEAL SHUNT PLACEMENT / LAPAROSCOPIC INSERTION PERITONEAL CATHETER      Family History  Problem Relation Age of Onset    Ovarian cancer Mother    Heart disease Father    Brain cancer Sister     Social history:  reports that she has quit smoking. She has never used smokeless tobacco. She reports that she does not currently use alcohol. She reports that she does not use drugs.    Allergies  Allergen Reactions   Methotrexate Derivatives Other (See Comments)    Dangerously decreased platelet count   Penicillins Itching    Tolerated Cephalosporin Date: 04/30/20.     Sulfa Antibiotics Itching    Medications:  Prior to Admission medications   Medication Sig Start Date End Date Taking? Authorizing Provider  acetaminophen (TYLENOL) 500 MG tablet Take 1,000 mg by mouth 3 (three) times daily as needed for mild pain.    Yes [provider]  alendronate (FOSAMAX) 70 MG tablet Take 70 mg by mouth once a week. 01/12/20  Yes [provider]  amLODipine (NORVASC) 5 MG tablet Take 5 mg by mouth daily.   Yes [provider]  labetalol (NORMODYNE) 100 MG tablet Take 100 mg by mouth 2 (two) times daily.    Yes [provider]  latanoprost (XALATAN) 0.005 % ophthalmic solution Place 1 drop into both eyes at bedtime.  08/02/17  Yes [provider]  leflunomide (ARAVA) 10 MG tablet Take 10 mg by mouth daily.   Yes [provider]  meclizine (ANTIVERT) 25 MG tablet Take 1  tablet (25 mg total) by mouth 3 (three) times daily as needed for dizziness. 02/18/15  Yes Quintella Reichert, MD  methocarbamol (ROBAXIN) 750 MG tablet TAKE 1 TABLET(750 MG) BY MOUTH EVERY 8 HOURS AS NEEDED FOR MUSCLE SPASMS 02/11/21  Yes Kathrynn Ducking, MD  Multiple Vitamin (MULTIVITAMIN) tablet Take 1 tablet by mouth daily.   Yes [provider]  ondansetron (ZOFRAN) 4 MG tablet Take 1 tablet (4 mg total) by mouth every 6 (six) hours. Patient taking differently: Take 4 mg by mouth every 8 (eight) hours as needed for nausea or vomiting. 02/18/15  Yes Quintella Reichert, MD  predniSONE (DELTASONE) 1 MG  tablet Take 2 mg by mouth daily.   Yes [provider]  traMADol (ULTRAM) 50 MG tablet Take 1 tablet (50 mg total) by mouth every 6 (six) hours as needed for moderate pain. 12/23/20  Yes Kathrynn Ducking, MD  traZODone (DESYREL) 50 MG tablet Take 1 tablet (50 mg total) by mouth at bedtime. 11/07/20  Yes Kathrynn Ducking, MD    ROS:  Out of a complete 14 system review of symptoms, the patient complains only of the following symptoms, and all other reviewed systems are negative.  Fatigue Walking difficulty Joint pains  Blood pressure 122/64, pulse 74, height '5\' 7"'$  (1.702 m), weight 162 lb (73.5 kg), SpO2 94 %.  Physical Exam  General: The patient is alert and cooperative at the time of the examination.  Skin: No significant peripheral edema is noted.  The left leg is significantly shorter than the right.   Neurologic Exam  Mental status: The patient is alert and oriented x 3 at the time of the examination. The patient has apparent normal recent and remote memory, with an apparently normal attention span and concentration ability.   Cranial nerves: Facial symmetry is present. Speech is normal, no aphasia or dysarthria is noted. Extraocular movements are full. Visual fields are full.  Motor: The patient has good strength in all 4 extremities, with exception of 2-3/5 strength with hip flexion on the left.  Sensory examination: Soft touch sensation is symmetric on the face, arms, and legs.  Coordination: The patient has good finger-nose-finger bilaterally.  The patient has difficulty performing heel-to-shin on either side, worse on the left.  Gait and station: The patient has a wide-based gait, she walks with a walker.  She has fairly good stride and turns with a walker.  Romberg is negative.  Reflexes: Deep tendon reflexes are symmetric.   Assessment/Plan:  1.  Normal pressure hydrocephalus  2.  Peripheral neuropathy  3.  Chronic gait disorder  The patient overall  is doing fairly well, she reports a gradual worsening of fatigue over time, but clinically her examination is unchanged.  The patient will continue to take trazodone as needed for sleep.  She will follow-up here in 1 year, in the future she can be followed through Dr. Krista Blue.  Jill Alexanders MD 07/09/2021 2:05 PM  [facility] [address] [place], [place] [address]  Phone [phone] Fax [phone]

## 2021-07-30 ENCOUNTER — Encounter (HOSPITAL_BASED_OUTPATIENT_CLINIC_OR_DEPARTMENT_OTHER): Payer: Self-pay

## 2021-07-30 ENCOUNTER — Other Ambulatory Visit: Payer: Self-pay

## 2021-07-30 ENCOUNTER — Inpatient Hospital Stay (HOSPITAL_BASED_OUTPATIENT_CLINIC_OR_DEPARTMENT_OTHER)
Admission: EM | Admit: 2021-07-30 | Discharge: 2021-08-09 | DRG: 266 | Disposition: A | Discharge status: Skilled Nursing Facility | Payer: Medicare Other | Attending: Cardiology | Admitting: Cardiology

## 2021-07-30 ENCOUNTER — Emergency Department (HOSPITAL_BASED_OUTPATIENT_CLINIC_OR_DEPARTMENT_OTHER): Payer: Medicare Other

## 2021-07-30 DIAGNOSIS — Z87891 Personal history of nicotine dependence: Secondary | ICD-10-CM

## 2021-07-30 DIAGNOSIS — F03A3 Unspecified dementia, mild, with mood disturbance: Secondary | ICD-10-CM | POA: Diagnosis present

## 2021-07-30 DIAGNOSIS — Y848 Other medical procedures as the cause of abnormal reaction of the patient, or of later complication, without mention of misadventure at the time of the procedure: Secondary | ICD-10-CM | POA: Diagnosis not present

## 2021-07-30 DIAGNOSIS — A419 Sepsis, unspecified organism: Secondary | ICD-10-CM

## 2021-07-30 DIAGNOSIS — N183 Chronic kidney disease, stage 3 unspecified: Secondary | ICD-10-CM | POA: Diagnosis present

## 2021-07-30 DIAGNOSIS — M069 Rheumatoid arthritis, unspecified: Secondary | ICD-10-CM | POA: Diagnosis present

## 2021-07-30 DIAGNOSIS — J95851 Ventilator associated pneumonia: Secondary | ICD-10-CM | POA: Diagnosis not present

## 2021-07-30 DIAGNOSIS — D649 Anemia, unspecified: Secondary | ICD-10-CM | POA: Diagnosis present

## 2021-07-30 DIAGNOSIS — I5031 Acute diastolic (congestive) heart failure: Secondary | ICD-10-CM | POA: Diagnosis not present

## 2021-07-30 DIAGNOSIS — I4892 Unspecified atrial flutter: Secondary | ICD-10-CM | POA: Diagnosis not present

## 2021-07-30 DIAGNOSIS — Z20822 Contact with and (suspected) exposure to covid-19: Secondary | ICD-10-CM | POA: Diagnosis not present

## 2021-07-30 DIAGNOSIS — I2510 Atherosclerotic heart disease of native coronary artery without angina pectoris: Secondary | ICD-10-CM | POA: Diagnosis not present

## 2021-07-30 DIAGNOSIS — I4891 Unspecified atrial fibrillation: Secondary | ICD-10-CM | POA: Diagnosis not present

## 2021-07-30 DIAGNOSIS — I5033 Acute on chronic diastolic (congestive) heart failure: Secondary | ICD-10-CM | POA: Diagnosis present

## 2021-07-30 DIAGNOSIS — D6959 Other secondary thrombocytopenia: Secondary | ICD-10-CM | POA: Diagnosis not present

## 2021-07-30 DIAGNOSIS — Z7952 Long term (current) use of systemic steroids: Secondary | ICD-10-CM

## 2021-07-30 DIAGNOSIS — I252 Old myocardial infarction: Secondary | ICD-10-CM

## 2021-07-30 DIAGNOSIS — I214 Non-ST elevation (NSTEMI) myocardial infarction: Secondary | ICD-10-CM | POA: Diagnosis not present

## 2021-07-30 DIAGNOSIS — R7401 Elevation of levels of liver transaminase levels: Secondary | ICD-10-CM | POA: Diagnosis not present

## 2021-07-30 DIAGNOSIS — J189 Pneumonia, unspecified organism: Secondary | ICD-10-CM

## 2021-07-30 DIAGNOSIS — Z66 Do not resuscitate: Secondary | ICD-10-CM | POA: Diagnosis not present

## 2021-07-30 DIAGNOSIS — I512 Rupture of papillary muscle, not elsewhere classified: Secondary | ICD-10-CM | POA: Diagnosis not present

## 2021-07-30 DIAGNOSIS — R739 Hyperglycemia, unspecified: Secondary | ICD-10-CM | POA: Diagnosis not present

## 2021-07-30 DIAGNOSIS — J810 Acute pulmonary edema: Secondary | ICD-10-CM | POA: Diagnosis not present

## 2021-07-30 DIAGNOSIS — G912 (Idiopathic) normal pressure hydrocephalus: Secondary | ICD-10-CM

## 2021-07-30 DIAGNOSIS — Z888 Allergy status to other drugs, medicaments and biological substances status: Secondary | ICD-10-CM

## 2021-07-30 DIAGNOSIS — E872 Acidosis, unspecified: Secondary | ICD-10-CM | POA: Diagnosis present

## 2021-07-30 DIAGNOSIS — I340 Nonrheumatic mitral (valve) insufficiency: Secondary | ICD-10-CM | POA: Diagnosis not present

## 2021-07-30 DIAGNOSIS — I248 Other forms of acute ischemic heart disease: Secondary | ICD-10-CM | POA: Diagnosis present

## 2021-07-30 DIAGNOSIS — N179 Acute kidney failure, unspecified: Secondary | ICD-10-CM | POA: Diagnosis present

## 2021-07-30 DIAGNOSIS — K761 Chronic passive congestion of liver: Secondary | ICD-10-CM | POA: Diagnosis present

## 2021-07-30 DIAGNOSIS — E876 Hypokalemia: Secondary | ICD-10-CM | POA: Diagnosis not present

## 2021-07-30 DIAGNOSIS — Z79899 Other long term (current) drug therapy: Secondary | ICD-10-CM

## 2021-07-30 DIAGNOSIS — Z9911 Dependence on respirator [ventilator] status: Secondary | ICD-10-CM | POA: Diagnosis not present

## 2021-07-30 DIAGNOSIS — I10 Essential (primary) hypertension: Secondary | ICD-10-CM

## 2021-07-30 DIAGNOSIS — Z8249 Family history of ischemic heart disease and other diseases of the circulatory system: Secondary | ICD-10-CM

## 2021-07-30 DIAGNOSIS — J9601 Acute respiratory failure with hypoxia: Secondary | ICD-10-CM | POA: Diagnosis present

## 2021-07-30 DIAGNOSIS — M47812 Spondylosis without myelopathy or radiculopathy, cervical region: Secondary | ICD-10-CM | POA: Diagnosis present

## 2021-07-30 DIAGNOSIS — Z880 Allergy status to penicillin: Secondary | ICD-10-CM

## 2021-07-30 DIAGNOSIS — Z7983 Long term (current) use of bisphosphonates: Secondary | ICD-10-CM

## 2021-07-30 DIAGNOSIS — I130 Hypertensive heart and chronic kidney disease with heart failure and stage 1 through stage 4 chronic kidney disease, or unspecified chronic kidney disease: Secondary | ICD-10-CM | POA: Diagnosis present

## 2021-07-30 DIAGNOSIS — R0902 Hypoxemia: Secondary | ICD-10-CM

## 2021-07-30 DIAGNOSIS — R531 Weakness: Secondary | ICD-10-CM

## 2021-07-30 DIAGNOSIS — Z006 Encounter for examination for normal comparison and control in clinical research program: Secondary | ICD-10-CM

## 2021-07-30 DIAGNOSIS — Z954 Presence of other heart-valve replacement: Secondary | ICD-10-CM | POA: Diagnosis not present

## 2021-07-30 DIAGNOSIS — R570 Cardiogenic shock: Secondary | ICD-10-CM | POA: Diagnosis present

## 2021-07-30 DIAGNOSIS — I509 Heart failure, unspecified: Secondary | ICD-10-CM | POA: Diagnosis not present

## 2021-07-30 DIAGNOSIS — D849 Immunodeficiency, unspecified: Secondary | ICD-10-CM | POA: Diagnosis not present

## 2021-07-30 DIAGNOSIS — Z882 Allergy status to sulfonamides status: Secondary | ICD-10-CM

## 2021-07-30 DIAGNOSIS — Z452 Encounter for adjustment and management of vascular access device: Secondary | ICD-10-CM

## 2021-07-30 DIAGNOSIS — J90 Pleural effusion, not elsewhere classified: Secondary | ICD-10-CM | POA: Diagnosis not present

## 2021-07-30 DIAGNOSIS — E785 Hyperlipidemia, unspecified: Secondary | ICD-10-CM | POA: Diagnosis present

## 2021-07-30 DIAGNOSIS — R079 Chest pain, unspecified: Secondary | ICD-10-CM | POA: Diagnosis not present

## 2021-07-30 DIAGNOSIS — G629 Polyneuropathy, unspecified: Secondary | ICD-10-CM | POA: Diagnosis present

## 2021-07-30 DIAGNOSIS — Z0189 Encounter for other specified special examinations: Secondary | ICD-10-CM

## 2021-07-30 LAB — COMPREHENSIVE METABOLIC PANEL
ALT: 31 U/L (ref 0–44)
AST: 112 U/L — ABNORMAL HIGH (ref 15–41)
Albumin: 4.2 g/dL (ref 3.5–5.0)
Alkaline Phosphatase: 58 U/L (ref 38–126)
Anion gap: 13 (ref 5–15)
BUN: 62 mg/dL — ABNORMAL HIGH (ref 8–23)
CO2: 18 mmol/L — ABNORMAL LOW (ref 22–32)
Calcium: 9.5 mg/dL (ref 8.9–10.3)
Chloride: 102 mmol/L (ref 98–111)
Creatinine, Ser: 4.43 mg/dL — ABNORMAL HIGH (ref 0.44–1.00)
GFR, Estimated: 9 mL/min — ABNORMAL LOW (ref >60)
Glucose, Bld: 136 mg/dL — ABNORMAL HIGH (ref 70–99)
Potassium: 4.4 mmol/L (ref 3.5–5.1)
Sodium: 133 mmol/L — ABNORMAL LOW (ref 135–145)
Total Bilirubin: 1 mg/dL (ref 0.3–1.2)
Total Protein: 6.9 g/dL (ref 6.5–8.1)

## 2021-07-30 LAB — URINALYSIS, ROUTINE W REFLEX MICROSCOPIC
Glucose, UA: NEGATIVE mg/dL
Hgb urine dipstick: NEGATIVE
Ketones, ur: NEGATIVE mg/dL
Leukocytes,Ua: NEGATIVE
Nitrite: NEGATIVE
Protein, ur: 30 mg/dL — AB
Specific Gravity, Urine: 1.03 (ref 1.005–1.030)
pH: 5 (ref 5.0–8.0)

## 2021-07-30 LAB — RESP PANEL BY RT-PCR (FLU A&B, COVID) ARPGX2
Influenza A by PCR: NEGATIVE
Influenza B by PCR: NEGATIVE
SARS Coronavirus 2 by RT PCR: NEGATIVE

## 2021-07-30 LAB — PROTIME-INR
INR: 1 (ref 0.8–1.2)
Prothrombin Time: 13.4 seconds (ref 11.4–15.2)

## 2021-07-30 LAB — CBC WITH DIFFERENTIAL/PLATELET
Abs Immature Granulocytes: 0.07 10*3/uL (ref 0.00–0.07)
Basophils Absolute: 0.1 10*3/uL (ref 0.0–0.1)
Basophils Relative: 0 %
Eosinophils Absolute: 0 10*3/uL (ref 0.0–0.5)
Eosinophils Relative: 0 %
HCT: 35.7 % — ABNORMAL LOW (ref 36.0–46.0)
Hemoglobin: 12.2 g/dL (ref 12.0–15.0)
Immature Granulocytes: 0 %
Lymphocytes Relative: 28 %
Lymphs Abs: 5.6 10*3/uL — ABNORMAL HIGH (ref 0.7–4.0)
MCH: 31.8 pg (ref 26.0–34.0)
MCHC: 34.2 g/dL (ref 30.0–36.0)
MCV: 93 fL (ref 80.0–100.0)
Monocytes Absolute: 1.8 10*3/uL — ABNORMAL HIGH (ref 0.1–1.0)
Monocytes Relative: 9 %
Neutro Abs: 12.8 10*3/uL — ABNORMAL HIGH (ref 1.7–7.7)
Neutrophils Relative %: 63 %
Platelets: 212 10*3/uL (ref 150–400)
RBC: 3.84 MIL/uL — ABNORMAL LOW (ref 3.87–5.11)
RDW: 13.2 % (ref 11.5–15.5)
WBC: 20.4 10*3/uL — ABNORMAL HIGH (ref 4.0–10.5)
nRBC: 0 % (ref 0.0–0.2)

## 2021-07-30 LAB — TROPONIN I (HIGH SENSITIVITY)
Troponin I (High Sensitivity): 16652 ng/L (ref <18)
Troponin I (High Sensitivity): 16254 ng/L (ref <18)
Troponin I (High Sensitivity): 13992 ng/L (ref <18)
Troponin I (High Sensitivity): 14937 ng/L (ref <18)

## 2021-07-30 LAB — URINALYSIS, MICROSCOPIC (REFLEX)

## 2021-07-30 LAB — LACTIC ACID, PLASMA
Lactic Acid, Venous: 2.6 mmol/L (ref 0.5–1.9)
Lactic Acid, Venous: 2 mmol/L (ref 0.5–1.9)

## 2021-07-30 LAB — APTT: aPTT: 23 seconds — ABNORMAL LOW (ref 24–36)

## 2021-07-30 LAB — TSH: TSH: 1.05 u[IU]/mL (ref 0.350–4.500)

## 2021-07-30 LAB — BRAIN NATRIURETIC PEPTIDE: B Natriuretic Peptide: 2001.3 pg/mL — ABNORMAL HIGH (ref 0.0–100.0)

## 2021-07-30 LAB — T4, FREE: Free T4: 0.9 ng/dL (ref 0.61–1.12)

## 2021-07-30 MED ORDER — SODIUM CHLORIDE 0.9% FLUSH
3.0000 mL | Freq: Two times a day (BID) | INTRAVENOUS | Status: DC
  Administered 2021-07-31 – 2021-08-09 (×19): 3 mL via INTRAVENOUS

## 2021-07-30 MED ORDER — SODIUM CHLORIDE 0.9 % IV SOLN
2.0000 g | INTRAVENOUS | Status: DC
  Filled 2021-07-30: qty 20

## 2021-07-30 MED ORDER — SODIUM CHLORIDE 0.9 % IV BOLUS
1000.0000 mL | Freq: Once | INTRAVENOUS | Status: AC
  Administered 2021-07-30: 1000 mL via INTRAVENOUS

## 2021-07-30 MED ORDER — SODIUM CHLORIDE 0.9 % IV SOLN
500.0000 mg | INTRAVENOUS | Status: DC
  Filled 2021-07-30: qty 500

## 2021-07-30 MED ORDER — LEFLUNOMIDE 20 MG PO TABS
10.0000 mg | ORAL_TABLET | Freq: Every day | ORAL | Status: DC
  Filled 2021-07-30: qty 0.5

## 2021-07-30 MED ORDER — ADULT MULTIVITAMIN W/MINERALS CH
1.0000 | ORAL_TABLET | Freq: Every day | ORAL | Status: DC
  Administered 2021-08-01: 1 via ORAL
  Filled 2021-07-30: qty 1

## 2021-07-30 MED ORDER — SODIUM CHLORIDE 0.9 % IV SOLN: INTRAVENOUS | Status: DC

## 2021-07-30 MED ORDER — LACTATED RINGERS IV BOLUS
1500.0000 mL | Freq: Once | INTRAVENOUS | Status: AC
  Administered 2021-07-30: 1500 mL via INTRAVENOUS

## 2021-07-30 MED ORDER — SODIUM CHLORIDE 0.9 % IV SOLN
2.0000 g | Freq: Once | INTRAVENOUS | Status: AC
  Administered 2021-07-30: 2 g via INTRAVENOUS
  Filled 2021-07-30: qty 20

## 2021-07-30 MED ORDER — LACTATED RINGERS IV BOLUS: 1500.0000 mL/kg | Freq: Once | INTRAVENOUS | Status: DC

## 2021-07-30 MED ORDER — SODIUM CHLORIDE 0.9 % IV SOLN
500.0000 mg | Freq: Once | INTRAVENOUS | Status: AC
  Administered 2021-07-30: 500 mg via INTRAVENOUS
  Filled 2021-07-30: qty 500

## 2021-07-30 MED ORDER — NOREPINEPHRINE 4 MG/250ML-% IV SOLN
0.0000 ug/min | INTRAVENOUS | Status: DC
  Filled 2021-07-30 (×2): qty 250

## 2021-07-30 MED ORDER — ACETAMINOPHEN 650 MG RE SUPP: 650.0000 mg | Freq: Four times a day (QID) | RECTAL | Status: DC | PRN

## 2021-07-30 MED ORDER — POLYETHYLENE GLYCOL 3350 17 G PO PACK: 17.0000 g | PACK | Freq: Every day | ORAL | Status: DC | PRN

## 2021-07-30 MED ORDER — HEPARIN SODIUM (PORCINE) 5000 UNIT/ML IJ SOLN
5000.0000 [IU] | Freq: Three times a day (TID) | INTRAMUSCULAR | Status: DC
  Administered 2021-07-30 – 2021-07-31 (×2): 5000 [IU] via SUBCUTANEOUS
  Filled 2021-07-30 (×2): qty 1

## 2021-07-30 MED ORDER — PREDNISONE 1 MG PO TABS
2.0000 mg | ORAL_TABLET | Freq: Every day | ORAL | Status: DC
  Administered 2021-07-30 – 2021-08-01 (×2): 2 mg via ORAL
  Filled 2021-07-30 (×3): qty 2

## 2021-07-30 MED ORDER — ACETAMINOPHEN 325 MG PO TABS: 650.0000 mg | ORAL_TABLET | Freq: Four times a day (QID) | ORAL | Status: DC | PRN

## 2021-07-30 NOTE — ED Notes (Signed)
[facility] on unit for transport  

## 2021-07-30 NOTE — ED Notes (Signed)
Pt off unit by [facility] at this time 

## 2021-07-30 NOTE — ED Provider Notes (Signed)
Patient turned over to me patient presented with septic parameters based on vital signs.  Lactic acid was elevated marked leukocytosis.  Patient was already ordered 30 cc/kg fluid.  And was already started on Rocephin and Zithromax for presumed pneumonia.  CT scan of the abdomen was negative except it showed evidence of a left lower lobe pneumonia.  Patient did have some hypotensive episodes here but map was always good.  Patient when she received 2 L of fluid her blood pressures came up to like [id number] systolic.  But patient started to sound a little bit wetter in the lungs.  Oxygen sats were okay respiratory rate was up some so we started her on BiPAP.  This was around the time that she needed to be transferred.  So she got switched to a progressive bed.  I did update the admitting physician.  And also [facility] although pressures after starting the BiPAP were a little soft around they did come up to 89.  They wanted Levophed available so we ordered it was hanging but not started.  The other interesting finding was the patient's troponins were 16,000.  Patient denied any kind of chest discomfort at all.  There was some epigastric discomfort.  So maybe that is correlating.  EKG without any acute change nothing consistent with a STEMI.  Admitting team made aware of this.  COVID testing was negative.  CT scan of the abdomen did not show any urinary retention.  They thought maybe there was some.  But most likely patient just was low on the fluid.  After the BiPAP was started it was made apparent by family members that patient was adamant about being a DNR.  Did not have her paperwork but I went and spoke with her while she was on the BiPAP.  She certainly seemed understand and she made it clear that she did not want to go on a ventilator and she did not want to be shocked or have CPR.  But she was fine with the BiPAP and fine with fluid resuscitation and antibiotics.  Taken by [employer] to [place] to progressive  bed.  CRITICAL CARE Performed by: Fredia Sorrow Total critical care time: 35 minutes Critical care time was exclusive of separately billable procedures and treating other patients. Critical care was necessary to treat or prevent imminent or life-threatening deterioration. Critical care was time spent personally by me on the following activities: development of treatment plan with patient and/or surrogate as well as nursing, discussions with consultants, evaluation of patient's response to treatment, examination of patient, obtaining history from patient or surrogate, ordering and performing treatments and interventions, ordering and review of laboratory studies, ordering and review of radiographic studies, pulse oximetry and re-evaluation of patient's condition.    Fredia Sorrow, MD 07/30/21 [phone]

## 2021-07-30 NOTE — ED Notes (Signed)
Trop IA:[id number] Zackowski MD made aware, at bedside speaking with family at this time. Pt denies CP.

## 2021-07-30 NOTE — ED Notes (Signed)
Patient transported to CT 

## 2021-07-30 NOTE — ED Notes (Signed)
LACTIC 2.0, Zackowski MD made aware, at bedside

## 2021-07-30 NOTE — H&P (Signed)
History and Physical   [name] [id number] DOB: Jun 09, 1941 DOA: 07/30/2021  PCP: Javier Glazier, MD   Patient coming from: Home  Chief Complaint: Weakness  HPI: [name] is a 80 y.o. 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  Patient continue get around on her own with some assistance however very weak this morning even unable to sit up or stand or ambulate.  She also reports some decreased p.o. intake for the past 2-3 days.  Son, who the position, stated that she appeared normal around 4 days ago.  Family reports that she started getting weak in the last couple days and today she went to go get her flu shot and she seemed altered at that location.  Also noted when son came out to see her today that she was very significantly weak and could not really even sit up or stand on her which is off her baseline as above.  They then went to the med center for further evaluation.  She does report some shortness of breath.  She denies fevers, chills, chest pain, shortness of breath, abdominal pain, constipation, diarrhea, nausea, vomiting.   ED Course: Vital signs in ED significant blood pressure in the 123XX123 to 123XX123 systolic however map remained 70s to 80s.  Also requiring 2 to 6 L to maintain saturations eventually transitioned to BiPAP after receiving IV fluids.  Lab work-up showed CMP with sodium 133, bicarb 18, BUN 62, creatinine elevated to 4.43 from baseline of normal at 0.7, glucose 136, AST 112.  CBC with leukocytosis to 20.4.  PT, PTT, INR within normal limits.  Troponin elevated to 16, 600 on first check in flat/downtrending at 16,200 on recheck.  TSH and T4 pending.  Lactic acid downtrending from 2.62.  Respiratory panel for flu and COVID is negative.  Urinalysis and urine culture pending.  Chest x-ray  showed left pleural effusion and atelectasis.  CT head showed right stent stable with a slight increase in ventricular enlargement.  CT down pelvis with small pelvic free fluid, shunt catheter in place, diverticulosis without diverticulitis, lower airspace disease worrisome for pneumonia in the visualized lower lung lobes.  Patient started on ceftriaxone and azithromycin received 2.5 L of IV fluids and received supple oxygen and placed on BiPAP as above.  Review of Systems: As per HPI otherwise all other systems reviewed and are negative.  Past Medical History:  Diagnosis Date   Abnormality of gait 04/17/2013   Cervical spondylosis    Cervicogenic headache    Right occipital neuralgia   Depression    Dyslipidemia    Gait disorder    Hyperlipidemia    Hypertension    Normal pressure hydrocephalus (HCC)    Occipital neuralgia 04/17/2013   right   Osteomyelitis of left knee region [facility])    Rheumatoid arthritis(714.0)    Vertigo 02/19/2015    Past Surgical History:  Procedure Laterality Date   CERVICAL SPINE SURGERY     JOINT REPLACEMENT     LUMBAR SPINE SURGERY     OVARY SURGERY     REPLACEMENT TOTAL KNEE Left    TOTAL KNEE ARTHROPLASTY Right 04/29/2020   Procedure: TOTAL KNEE ARTHROPLASTY;  Surgeon: [name], MD;  Location: [facility];  Service: Orthopedics;  Laterality: Right;  41mn   VENTRICULO-PERITONEAL SHUNT PLACEMENT / LAPAROSCOPIC INSERTION PERITONEAL CATHETER      Social History  reports that  she has quit smoking. She has never used smokeless tobacco. She reports that she does not currently use alcohol. She reports that she does not use drugs.  Allergies  Allergen Reactions   Methotrexate Derivatives Other (See Comments)    Dangerously decreased platelet count   Penicillins Itching    Tolerated Cephalosporin Date: 04/30/20.     Sulfa Antibiotics Itching    Family History  Problem Relation Age of Onset   Ovarian cancer Mother    Heart disease Father    Brain  cancer Sister   Reviewed on admission  Prior to Admission medications   Medication Sig Start Date End Date Taking? Authorizing Provider  acetaminophen (TYLENOL) 500 MG tablet Take 1,000 mg by mouth 3 (three) times daily as needed for mild pain.     [provider]  acetaminophen-codeine (TYLENOL #3) 300-30 MG tablet Take 1 tablet by mouth every 8 (eight) hours as needed for moderate pain.    [provider]  alendronate (FOSAMAX) 70 MG tablet Take 70 mg by mouth once a week. 01/12/20   [provider]  amLODipine (NORVASC) 5 MG tablet Take 5 mg by mouth daily.    [provider]  labetalol (NORMODYNE) 100 MG tablet Take 100 mg by mouth 2 (two) times daily.     [provider]  latanoprost (XALATAN) 0.005 % ophthalmic solution Place 1 drop into both eyes at bedtime.  08/02/17   [provider]  leflunomide (ARAVA) 10 MG tablet Take 10 mg by mouth daily.    [provider]  meclizine (ANTIVERT) 25 MG tablet Take 1 tablet (25 mg total) by mouth 3 (three) times daily as needed for dizziness. 02/18/15   Quintella Reichert, MD  Multiple Vitamin (MULTIVITAMIN) tablet Take 1 tablet by mouth daily.    [provider]  ondansetron (ZOFRAN) 4 MG tablet Take 1 tablet (4 mg total) by mouth every 6 (six) hours. Patient taking differently: Take 4 mg by mouth every 8 (eight) hours as needed for nausea or vomiting. 02/18/15   Quintella Reichert, MD  predniSONE (DELTASONE) 1 MG tablet Take 2 mg by mouth daily.    [provider]  traZODone (DESYREL) 50 MG tablet Take 1 tablet (50 mg total) by mouth at bedtime. 11/07/20   Kathrynn Ducking, MD    Physical Exam: Vitals:   07/30/21 1650 07/30/21 1700 07/30/21 1810 07/30/21 1815  BP: (!) 84/54 (!) 88/70  109/65  Pulse: 87 88  88  Resp: (!) 28 (!) 30 (!) 25   Temp:    97.6 F (36.4 C)  TempSrc:    Oral  SpO2: 94% 92%  92%   Physical Exam Constitutional:      General: She is not in acute  distress.    Comments: Ill-appearing elderly female, on BiPAP, appears to be breathing comfortably at this time.  HENT:     Head: Normocephalic and atraumatic.     Mouth/Throat:     Mouth: Mucous membranes are moist.     Pharynx: Oropharynx is clear.  Eyes:     Extraocular Movements: Extraocular movements intact.     Pupils: Pupils are equal, round, and reactive to light.  Cardiovascular:     Rate and Rhythm: Normal rate and regular rhythm.     Pulses: Normal pulses.     Heart sounds: Normal heart sounds.  Pulmonary:     Effort: Pulmonary effort is normal. No respiratory distress.     Breath sounds: Rales (trace) present.  Abdominal:     General: Bowel sounds are normal. There is no distension.     Palpations: Abdomen is soft.     Tenderness: There is no abdominal tenderness.  Musculoskeletal:        General: No swelling or deformity.  Skin:    General: Skin is warm and dry.  Neurological:     General: No focal deficit present.     Mental Status: Mental status is at baseline.   Labs on Admission: I have personally reviewed following labs and imaging studies  CBC: Recent Labs  Lab 07/30/21 1351  WBC 20.4*  NEUTROABS 12.8*  HGB 12.2  HCT 35.7*  MCV 93.0  PLT 99991111    Basic Metabolic Panel: Recent Labs  Lab 07/30/21 1351  NA 133*  K 4.4  CL 102  CO2 18*  GLUCOSE 136*  BUN 62*  CREATININE 4.43*  CALCIUM 9.5    GFR: CrCl cannot be calculated (Unknown ideal weight.).  Liver Function Tests: Recent Labs  Lab 07/30/21 1351  AST 112*  ALT 31  ALKPHOS 58  BILITOT 1.0  PROT 6.9  ALBUMIN 4.2    Urine analysis:    Component Value Date/Time   COLORURINE YELLOW 07/30/2021 1351   APPEARANCEUR HAZY (A) 07/30/2021 1351   LABSPEC 1.030 07/30/2021 1351   PHURINE 5.0 07/30/2021 1351   GLUCOSEU NEGATIVE 07/30/2021 1351   HGBUR NEGATIVE 07/30/2021 1351   BILIRUBINUR SMALL (A) 07/30/2021 1351   KETONESUR NEGATIVE 07/30/2021 1351   PROTEINUR 30 (A) 07/30/2021  1351   UROBILINOGEN 1.0 02/18/2015 1630   NITRITE NEGATIVE 07/30/2021 1351   LEUKOCYTESUR NEGATIVE 07/30/2021 1351    Radiological Exams on Admission: CT ABDOMEN PELVIS WO CONTRAST  Result Date: 07/30/2021 CLINICAL DATA:  Acute abdominal pain. EXAM: CT ABDOMEN AND PELVIS WITHOUT CONTRAST TECHNIQUE: Multidetector CT imaging of the abdomen and pelvis was performed following the standard protocol without IV contrast. COMPARISON:  None. FINDINGS: Lower chest: There are small bilateral pleural effusions. There is some patchy airspace disease in the left lower lobe and atelectasis in the right lower lobe. Hepatobiliary: No focal liver abnormality is seen. No gallstones, gallbladder wall thickening, or biliary dilatation. Pancreas: Unremarkable. No pancreatic ductal dilatation or surrounding inflammatory changes. Spleen: Normal in size without focal abnormality. Adrenals/Urinary Tract: There are small left peripelvic cysts. The kidneys, adrenal glands, ureters and bladder are otherwise within normal limits. Stomach/Bowel: Stomach is within normal limits. Appendix appears normal. No evidence of bowel wall thickening, distention, or inflammatory changes. There is sigmoid colon diverticulosis without evidence for acute diverticulitis. Vascular/Lymphatic: Aortic atherosclerosis. No enlarged abdominal or pelvic lymph nodes. Reproductive: Uterus and bilateral adnexa are unremarkable. Other: There is a small amount of free fluid in the pelvis. VP shunt catheter is present entering the right abdomen with distal catheter tip located in the right pelvis. The visualized catheter appears intact. There is a small fat containing umbilical hernia. Musculoskeletal: Multilevel degenerative changes affect the spine. There is mild chronic compression deformity of the superior endplate of 624THL. There is a healed right inferior pubic ramus fracture. There are severe degenerative changes of both hips. No acute fractures are identified.  IMPRESSION: 1. Small amount of free fluid in the pelvis. 2. 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 3. Colonic diverticulosis without evidence for diverticulitis. 4. Small bilateral pleural effusions with left lower lobe airspace disease worrisome for infection. 5.  Aortic Atherosclerosis (ICD10-I70.0). Electronically Signed   By: Ronney Asters M.D.   On: 07/30/2021 15:36   CT  Head Wo Contrast  Result Date: 07/30/2021 CLINICAL DATA:  Delirium. Bilateral leg weakness. Altered mental status. Shunt. EXAM: CT HEAD WITHOUT CONTRAST TECHNIQUE: Contiguous axial images were obtained from the base of the skull through the vertex without intravenous contrast. COMPARISON:  11/09/2018 FINDINGS: Brain: Right frontal ventricular shunt catheter in the right lateral ventricle unchanged. Moderate ventricular enlargement most notably of the third and lateral ventricles. Lateral ventricle size slightly larger. Biventricular diameter now 74.5 mm compared with 73 mm previously. Mild periventricular white matter hypodensity. Negative for acute infarct, hemorrhage, mass Vascular: Negative for hyperdense vessel Skull: No acute abnormality Sinuses/Orbits: Paranasal sinuses clear. Bilateral cataract extraction Other: None IMPRESSION: Right frontal shunt catheter unchanged. Slight progression of ventricle enlargement. No acute abnormality. Electronically Signed   By: Franchot Gallo M.D.   On: 07/30/2021 14:18   DG Chest Port 1 View  Result Date: 07/30/2021 CLINICAL DATA:  Possible sepsis.  Leg weakness. EXAM: PORTABLE CHEST 1 VIEW COMPARISON:  07/20/2017. FINDINGS: Trachea is midline. Heart size stable. Thoracic aorta is calcified. Shunt catheter projects over the midline chest. Biapical pleural thickening. Bibasilar subsegmental volume loss. Probable small left pleural effusion. IMPRESSION: 1. Probable small left pleural effusion. 2. Bibasilar atelectasis. Electronically Signed   By: Lorin Picket M.D.   On: 07/30/2021 14:04    EKG:  Independently reviewed.  Sinus rhythm at 80 bpm, baseline artifact, similar to previous, possible upsloping ST elevation/J-point elevation in V1.  Assessment/Plan Principal Problem:   Acute respiratory failure with hypoxia (HCC) Active Problems:   Normal pressure hydrocephalus (HCC)   Generalized weakness   PNA (pneumonia)   AKI (acute kidney injury) (HCC)   HTN (hypertension)  Generalized weakness Pneumonia Acute respiratory failure with hypoxia > Patient presenting with ongoing weakness.  Found to be year requiring oxygen in the ED and her hypoxic respiratory failure worsened after IV fluids eventually placed on BiPAP. > Chest x-ray was inconclusive but CT of the abdomen pelvis did not show infection abdomen pelvis however did demonstrate lower lobe disease concerning for pneumonia. > Patient started on ceftriaxone and azithromycin in the ED.  She also received a 2.5 L bolus.  Lactic acid downtrending from 2.6-2.  Troponin elevated but stable as below.  Leukocytosis 20.4. > Blood pressures been low normal in the 123XX123 to 123XX123 systolic however has maintained good maps in the 70s to 80s. - Monitor on progressive unit considering worsening respiratory failure - BiPAP continuous, then as needed - Continue ceftriaxone azithromycin - Check strep and Legionella urinary antigen - Trend fever curve and white count  AKI > Presented with creatinine of 4.43 to from previous of 0.7.  With BUN of 62 ratio consistent with prerenal disease.  No significant urinary retention noted on CT of the abdomen pelvis.  > Did receive weekly 5 L IV fluids in the ED however her respiratory status seem to worsen after this, will proceed with gentle IV fluids. - Avoid nephrotoxic agents - Continue gentle IV fluids  - Trend renal function electrolytes  NSTEMI > Patient noted to have troponin elevated to 16,000.  However this was flat/downtrending at initial 16,600 and then 16,200 on repeat.  Suspect demand ischemia  in the setting of her suspected pneumonia and hypoxia.  > No chest pain.  - We will continue to trend troponin for now - Cardiology consulted, and states they will see the patient in the morning - Check BNP  Normal pressure hydrocephalus > History of this with shunt in place.  Imaging showed CT  head with shunt in place and a slight enlargement in the ventricles.  Shunt also noted to be in place with small amount of free fluid in the abdomen and pelvis CT.  Patient remains alert and oriented and better question for weakness with her leukocytosis and respiratory failure as above. - Continue to monitor  Hypertension - Holding home antihypertensives in the setting of low normal blood pressure on presentation   RA - Continue flutamide and Prednisone  - Consider stress dose steroids if blood pressure drops  DVT prophylaxis: Heparin  Code Status:   DNR  Family Communication:  Family updated at bedside.  Disposition Plan:   Patient is from:  Home  Anticipated DC to:  Home  Anticipated DC date:  2 to 5 days  Anticipated DC barriers: None  Consults called:  Cardiology, states they will see the patient in the morning. Admission status:  Inpatient, progressive  Severity of Illness: The appropriate patient status for this patient is INPATIENT. Inpatient status is judged to be reasonable and necessary in order to provide the required intensity of service to ensure the patient's safety. The patient's presenting symptoms, physical exam findings, and initial radiographic and laboratory data in the context of their chronic comorbidities is felt to place them at high risk for further clinical deterioration. Furthermore, it is not anticipated that the patient will be medically stable for discharge from the hospital within 2 midnights of admission.   * I certify that at the point of admission it is my clinical judgment that the patient will require inpatient hospital care spanning beyond 2 midnights from  the point of admission due to high intensity of service, high risk for further deterioration and high frequency of surveillance required.Marcelyn Bruins MD [facility]  How to contact the [facility] Attending or Consulting provider [place] or covering provider during after hours [place], for this patient?   Check the care team in [facility] and look for a) attending/consulting [facility] provider listed and b) the [facility] team listed Log into [email or web address] and use [facility] universal password to access. If you do not have the password, please contact the hospital operator. Locate the [facility] provider you are looking for under [facility] and page to a number that you can be directly reached. If you still have difficulty reaching the provider, please page the [facility] (Director on Call) for the Hospitalists listed on amion for assistance.  07/30/2021, 6:45 PM

## 2021-07-30 NOTE — ED Triage Notes (Addendum)
Pt c/o increase in weakness to legs started 2 days ago-normally ueses walker-has been using w/c-denies pain-denies fever/flu sx-NAD-to triage in w/c-pt's son added that pt had had AMS as well

## 2021-07-30 NOTE — Progress Notes (Signed)
RT NOTE:  Pt received from [facility] and placed on our BiPAP machine here at [facility] with no complications noted. RT asked pt if she was comfortable and she nodded her head yes. Son and RN at bedside now. Vitals are stable at this time, RT will continue to monitor.

## 2021-07-30 NOTE — ED Notes (Signed)
Lactic 2.6, Tyrone Nine MD made aware, awaiting new orders, Radene Ou RN also made aware and at bedside.

## 2021-07-30 NOTE — ED Provider Notes (Signed)
[place] EMERGENCY DEPARTMENT Provider Note   CSN: [id number] Arrival date & time: 07/30/21  1256     History Chief Complaint  Patient presents with   Weakness    [name] is a 80 y.o. female.  80 yo F with a chief complaints of weakness.  This was noticed this morning.  Patient normally is able to get around with some assistance but this morning was unable to even sit on her own volition.  Unable to ambulate.  No cough congestion or fever noted.  Some decreased oral intake over the past couple days.  No new wounds or sores.  No recent medication changes.  No chest pain or trouble breathing.  The history is provided by the patient.  Weakness Severity:  Moderate Onset quality:  Gradual Duration:  1 day Timing:  Constant Progression:  Unchanged Chronicity:  New Relieved by:  Nothing Worsened by:  Nothing Ineffective treatments:  None tried Associated symptoms: no abdominal pain, no arthralgias, no chest pain, no dizziness, no dysuria, no fever, no headaches, no myalgias, no nausea, no shortness of breath, no urgency and no vomiting       Past Medical History:  Diagnosis Date   Abnormality of gait 04/17/2013   Cervical spondylosis    Cervicogenic headache    Right occipital neuralgia   Depression    Dyslipidemia    Gait disorder    Hyperlipidemia    Hypertension    Normal pressure hydrocephalus (HCC)    Occipital neuralgia 04/17/2013   right   Osteomyelitis of left knee region [facility])    Rheumatoid arthritis(714.0)    Vertigo 02/19/2015    Patient Active Problem List   Diagnosis Date Noted   Osteoarthritis of knee 04/30/2020   OA (osteoarthritis) of knee 04/29/2020   Primary osteoarthritis of right knee 04/29/2020   Normal pressure hydrocephalus ([place]) 02/19/2015   Vertigo 02/19/2015   Peripheral neuropathy 11/20/2013   Abnormality of gait 04/17/2013   Occipital neuralgia 04/17/2013   Headache 10/17/2012   Cervical spondylosis without  myelopathy 10/17/2012   Osteomyelitis of left knee region [facility]) 02/27/2011    Past Surgical History:  Procedure Laterality Date   CERVICAL SPINE SURGERY     JOINT REPLACEMENT     LUMBAR SPINE SURGERY     OVARY SURGERY     REPLACEMENT TOTAL KNEE Left    TOTAL KNEE ARTHROPLASTY Right 04/29/2020   Procedure: TOTAL KNEE ARTHROPLASTY;  Surgeon: [name], MD;  Location: [facility] ORS;  Service: Orthopedics;  Laterality: Right;  39mn   VENTRICULO-PERITONEAL SHUNT PLACEMENT / LAPAROSCOPIC INSERTION PERITONEAL CATHETER       OB History   No obstetric history on file.     Family History  Problem Relation Age of Onset   Ovarian cancer Mother    Heart disease Father    Brain cancer Sister     Social History   Tobacco Use   Smoking status: Former   Smokeless tobacco: Never  VScientific laboratory technicianUse: Never used  Substance Use Topics   Alcohol use: Not Currently   Drug use: No    Home Medications Prior to Admission medications   Medication Sig Start Date End Date Taking? Authorizing Provider  acetaminophen (TYLENOL) 500 MG tablet Take 1,000 mg by mouth 3 (three) times daily as needed for mild pain.     [provider]  acetaminophen-codeine (TYLENOL #3) 300-30 MG tablet Take 1 tablet by mouth every 8 (eight) hours as needed for  moderate pain.    [provider]  alendronate (FOSAMAX) 70 MG tablet Take 70 mg by mouth once a week. 01/12/20   [provider]  amLODipine (NORVASC) 5 MG tablet Take 5 mg by mouth daily.    [provider]  labetalol (NORMODYNE) 100 MG tablet Take 100 mg by mouth 2 (two) times daily.     [provider]  latanoprost (XALATAN) 0.005 % ophthalmic solution Place 1 drop into both eyes at bedtime.  08/02/17   [provider]  leflunomide (ARAVA) 10 MG tablet Take 10 mg by mouth daily.    [provider]  meclizine (ANTIVERT) 25 MG tablet Take 1 tablet (25 mg total) by mouth 3 (three) times daily as  needed for dizziness. 02/18/15   Quintella Reichert, MD  Multiple Vitamin (MULTIVITAMIN) tablet Take 1 tablet by mouth daily.    [provider]  ondansetron (ZOFRAN) 4 MG tablet Take 1 tablet (4 mg total) by mouth every 6 (six) hours. Patient taking differently: Take 4 mg by mouth every 8 (eight) hours as needed for nausea or vomiting. 02/18/15   Quintella Reichert, MD  predniSONE (DELTASONE) 1 MG tablet Take 2 mg by mouth daily.    [provider]  traZODone (DESYREL) 50 MG tablet Take 1 tablet (50 mg total) by mouth at bedtime. 11/07/20   Kathrynn Ducking, MD    Allergies    Methotrexate derivatives, Penicillins, and Sulfa antibiotics  Review of Systems   Review of Systems  Constitutional:  Negative for chills and fever.  HENT:  Negative for congestion and rhinorrhea.   Eyes:  Negative for redness and visual disturbance.  Respiratory:  Negative for shortness of breath and wheezing.   Cardiovascular:  Negative for chest pain and palpitations.  Gastrointestinal:  Negative for abdominal pain, nausea and vomiting.  Genitourinary:  Positive for decreased urine volume. Negative for dysuria and urgency.  Musculoskeletal:  Negative for arthralgias and myalgias.  Skin:  Negative for pallor and wound.  Neurological:  Positive for weakness. Negative for dizziness and headaches.   Physical Exam Updated Vital Signs BP (!) 92/55   Pulse 76   Temp 98 F (36.7 C) (Rectal)   Resp (!) 22   SpO2 93%   Physical Exam Vitals and nursing note reviewed.  Constitutional:      General: She is not in acute distress.    Appearance: She is well-developed. She is not diaphoretic.  HENT:     Head: Normocephalic and atraumatic.  Eyes:     Pupils: Pupils are equal, round, and reactive to light.  Cardiovascular:     Rate and Rhythm: Normal rate and regular rhythm.     Heart sounds: No murmur heard.   No friction rub. No gallop.  Pulmonary:     Effort: Pulmonary effort is normal.     Breath  sounds: No wheezing or rales.  Abdominal:     General: There is no distension.     Palpations: Abdomen is soft.     Tenderness: There is no abdominal tenderness. There is guarding.     Comments: Mild diffuse abdominal guarding.  No specific tenderness when asked.   Musculoskeletal:        General: No tenderness.     Cervical back: Normal range of motion and neck supple.  Skin:    General: Skin is warm and dry.     Comments: No obvious rash.    Neurological:     Mental Status: She  is alert and oriented to person, place, and time.  Psychiatric:        Behavior: Behavior normal.    ED Results / Procedures / Treatments   Labs (all labs ordered are listed, but only abnormal results are displayed) Labs Reviewed  LACTIC ACID, PLASMA - Abnormal; Notable for the following components:      Result Value   Lactic Acid, Venous 2.6 (*)    All other components within normal limits  COMPREHENSIVE METABOLIC PANEL - Abnormal; Notable for the following components:   Sodium 133 (*)    CO2 18 (*)    Glucose, Bld 136 (*)    BUN 62 (*)    Creatinine, Ser 4.43 (*)    AST 112 (*)    GFR, Estimated 9 (*)    All other components within normal limits  CBC WITH DIFFERENTIAL/PLATELET - Abnormal; Notable for the following components:   WBC 20.4 (*)    RBC 3.84 (*)    HCT 35.7 (*)    All other components within normal limits  APTT - Abnormal; Notable for the following components:   aPTT 23 (*)    All other components within normal limits  CULTURE, BLOOD (ROUTINE X 2)  CULTURE, BLOOD (ROUTINE X 2)  URINE CULTURE  RESP PANEL BY RT-PCR (FLU A&B, COVID) ARPGX2  PROTIME-INR  LACTIC ACID, PLASMA  URINALYSIS, ROUTINE W REFLEX MICROSCOPIC  TROPONIN I (HIGH SENSITIVITY)    EKG EKG Interpretation  Date/Time:  Wednesday July 30 2021 13:19:19 EDT Ventricular Rate:  80 PR Interval:  204 QRS Duration: 95 QT Interval:  377 QTC Calculation: 435 R Axis:   78 Text Interpretation: Sinus rhythm  Anterior infarct, old No significant change since last tracing Confirmed by Deno Etienne [phone]) on 07/30/2021 2:11:07 PM  Radiology CT Head Wo Contrast  Result Date: 07/30/2021 CLINICAL DATA:  Delirium. Bilateral leg weakness. Altered mental status. Shunt. EXAM: CT HEAD WITHOUT CONTRAST TECHNIQUE: Contiguous axial images were obtained from the base of the skull through the vertex without intravenous contrast. COMPARISON:  11/09/2018 FINDINGS: Brain: Right frontal ventricular shunt catheter in the right lateral ventricle unchanged. Moderate ventricular enlargement most notably of the third and lateral ventricles. Lateral ventricle size slightly larger. Biventricular diameter now 74.5 mm compared with 73 mm previously. Mild periventricular white matter hypodensity. Negative for acute infarct, hemorrhage, mass Vascular: Negative for hyperdense vessel Skull: No acute abnormality Sinuses/Orbits: Paranasal sinuses clear. Bilateral cataract extraction Other: None IMPRESSION: Right frontal shunt catheter unchanged. Slight progression of ventricle enlargement. No acute abnormality. Electronically Signed   By: Franchot Gallo M.D.   On: 07/30/2021 14:18   DG Chest Port 1 View  Result Date: 07/30/2021 CLINICAL DATA:  Possible sepsis.  Leg weakness. EXAM: PORTABLE CHEST 1 VIEW COMPARISON:  07/20/2017. FINDINGS: Trachea is midline. Heart size stable. Thoracic aorta is calcified. Shunt catheter projects over the midline chest. Biapical pleural thickening. Bibasilar subsegmental volume loss. Probable small left pleural effusion. IMPRESSION: 1. Probable small left pleural effusion. 2. Bibasilar atelectasis. Electronically Signed   By: Lorin Picket M.D.   On: 07/30/2021 14:04    Procedures Procedures   Medications Ordered in ED Medications  cefTRIAXone (ROCEPHIN) 2 g in sodium chloride 0.9 % 100 mL IVPB (has no administration in time range)  sodium chloride 0.9 % bolus 1,000 mL (1,000 mLs Intravenous New  Bag/Given 07/30/21 1412)    ED Course  I have reviewed the triage vital signs and the nursing notes.  Pertinent labs & imaging results  that were available during my care of the patient were reviewed by me and considered in my medical decision making (see chart for details).    MDM Rules/Calculators/A&P                           80 yo F with a chief complaints of increasing fatigue this morning.  Patient with inability to do her normal activities.  Found to have acute renal dysfunction metabolic acidosis with anion gap.  Lactate of 2.6.  Newly hypoxic oxygen saturation in the upper 80s.  Placed on 2 L of oxygen with some improvement.  Chest x-ray viewed by me with possible left lower lobe infiltrate.  UA not obviously infected.  With some involuntary guarding of the abdomen even without overt pain we will obtain a CT scan.  Blood pressures soft but maps greater than 65.  Will discuss with medicine for admission.  CRITICAL CARE Performed by: Cecilio Asper   Total critical care time: 35 minutes  Critical care time was exclusive of separately billable procedures and treating other patients.  Critical care was necessary to treat or prevent imminent or life-threatening deterioration.  Critical care was time spent personally by me on the following activities: development of treatment plan with patient and/or surrogate as well as nursing, discussions with consultants, evaluation of patient's response to treatment, examination of patient, obtaining history from patient or surrogate, ordering and performing treatments and interventions, ordering and review of laboratory studies, ordering and review of radiographic studies, pulse oximetry and re-evaluation of patient's condition.  The patients results and plan were reviewed and discussed.   Any x-rays performed were independently reviewed by myself.   Differential diagnosis were considered with the presenting HPI.  Medications   cefTRIAXone (ROCEPHIN) 2 g in sodium chloride 0.9 % 100 mL IVPB (has no administration in time range)  sodium chloride 0.9 % bolus 1,000 mL (1,000 mLs Intravenous New Bag/Given 07/30/21 1412)    Vitals:   07/30/21 1330 07/30/21 1406 07/30/21 1410 07/30/21 1415  BP: (!) 107/54  92/60 (!) 92/55  Pulse: 78  80 76  Resp: (!) 22  (!) 22 (!) 22  Temp:  98 F (36.7 C)    TempSrc:  Rectal    SpO2: 96%  93% 93%    Final diagnoses:  AKI (acute kidney injury) ([place])  Generalized weakness    Admission/ observation were discussed with the admitting physician, patient and/or family and they are comfortable with the plan.    Final Clinical Impression(s) / ED Diagnoses Final diagnoses:  AKI (acute kidney injury) ([place])  Generalized weakness    Rx / DC Orders ED Discharge Orders     None        Deno Etienne, DO 07/30/21 1507

## 2021-07-30 NOTE — ED Notes (Signed)
Zackowski MD and carelink MD [place] aware [place] [id number]

## 2021-07-30 NOTE — Progress Notes (Signed)
Report given to [facility] RRT at [facility].

## 2021-07-31 ENCOUNTER — Inpatient Hospital Stay (HOSPITAL_COMMUNITY): Payer: Medicare Other

## 2021-07-31 ENCOUNTER — Encounter (HOSPITAL_COMMUNITY)
Admission: EM | Disposition: A | Discharge status: Skilled Nursing Facility | Payer: Self-pay | Source: Home / Self Care | Attending: Critical Care Medicine

## 2021-07-31 ENCOUNTER — Other Ambulatory Visit (HOSPITAL_COMMUNITY): Payer: Medicare Other

## 2021-07-31 DIAGNOSIS — I340 Nonrheumatic mitral (valve) insufficiency: Principal | ICD-10-CM

## 2021-07-31 DIAGNOSIS — D849 Immunodeficiency, unspecified: Secondary | ICD-10-CM

## 2021-07-31 DIAGNOSIS — R079 Chest pain, unspecified: Secondary | ICD-10-CM

## 2021-07-31 DIAGNOSIS — J810 Acute pulmonary edema: Secondary | ICD-10-CM

## 2021-07-31 DIAGNOSIS — J9601 Acute respiratory failure with hypoxia: Secondary | ICD-10-CM | POA: Diagnosis not present

## 2021-07-31 DIAGNOSIS — R570 Cardiogenic shock: Secondary | ICD-10-CM | POA: Diagnosis not present

## 2021-07-31 DIAGNOSIS — I5031 Acute diastolic (congestive) heart failure: Secondary | ICD-10-CM | POA: Diagnosis not present

## 2021-07-31 DIAGNOSIS — R531 Weakness: Secondary | ICD-10-CM | POA: Diagnosis not present

## 2021-07-31 DIAGNOSIS — I10 Essential (primary) hypertension: Secondary | ICD-10-CM | POA: Diagnosis not present

## 2021-07-31 DIAGNOSIS — N179 Acute kidney failure, unspecified: Secondary | ICD-10-CM

## 2021-07-31 HISTORY — PX: IABP INSERTION: CATH118242

## 2021-07-31 LAB — POCT I-STAT 7, (LYTES, BLD GAS, ICA,H+H)
Acid-base deficit: 5 mmol/L — ABNORMAL HIGH (ref 0.0–2.0)
Bicarbonate: 19.6 mmol/L — ABNORMAL LOW (ref 20.0–28.0)
Calcium, Ion: 1.15 mmol/L (ref 1.15–1.40)
HCT: 34 % — ABNORMAL LOW (ref 36.0–46.0)
Hemoglobin: 11.6 g/dL — ABNORMAL LOW (ref 12.0–15.0)
O2 Saturation: 100 %
Potassium: 4.1 mmol/L (ref 3.5–5.1)
Sodium: 137 mmol/L (ref 135–145)
TCO2: 21 mmol/L — ABNORMAL LOW (ref 22–32)
pCO2 arterial: 35.7 mmHg (ref 32.0–48.0)
pH, Arterial: 7.347 — ABNORMAL LOW (ref 7.350–7.450)
pO2, Arterial: 199 mmHg — ABNORMAL HIGH (ref 83.0–108.0)

## 2021-07-31 LAB — COMPREHENSIVE METABOLIC PANEL
ALT: 30 U/L (ref 0–44)
AST: 79 U/L — ABNORMAL HIGH (ref 15–41)
Albumin: 3.6 g/dL (ref 3.5–5.0)
Alkaline Phosphatase: 51 U/L (ref 38–126)
Anion gap: 13 (ref 5–15)
BUN: 60 mg/dL — ABNORMAL HIGH (ref 8–23)
CO2: 18 mmol/L — ABNORMAL LOW (ref 22–32)
Calcium: 8.8 mg/dL — ABNORMAL LOW (ref 8.9–10.3)
Chloride: 104 mmol/L (ref 98–111)
Creatinine, Ser: 3.81 mg/dL — ABNORMAL HIGH (ref 0.44–1.00)
GFR, Estimated: 11 mL/min — ABNORMAL LOW (ref >60)
Glucose, Bld: 133 mg/dL — ABNORMAL HIGH (ref 70–99)
Potassium: 4.2 mmol/L (ref 3.5–5.1)
Sodium: 135 mmol/L (ref 135–145)
Total Bilirubin: 1 mg/dL (ref 0.3–1.2)
Total Protein: 6.3 g/dL — ABNORMAL LOW (ref 6.5–8.1)

## 2021-07-31 LAB — PROCALCITONIN: Procalcitonin: 1.66 ng/mL

## 2021-07-31 LAB — MRSA NEXT GEN BY PCR, NASAL: MRSA by PCR Next Gen: NOT DETECTED

## 2021-07-31 LAB — POCT I-STAT EG7
Acid-base deficit: 5 mmol/L — ABNORMAL HIGH (ref 0.0–2.0)
Acid-base deficit: 6 mmol/L — ABNORMAL HIGH (ref 0.0–2.0)
Bicarbonate: 21.5 mmol/L (ref 20.0–28.0)
Bicarbonate: 22.4 mmol/L (ref 20.0–28.0)
Calcium, Ion: 1.15 mmol/L (ref 1.15–1.40)
Calcium, Ion: 1.17 mmol/L (ref 1.15–1.40)
HCT: 31 % — ABNORMAL LOW (ref 36.0–46.0)
HCT: 33 % — ABNORMAL LOW (ref 36.0–46.0)
Hemoglobin: 10.5 g/dL — ABNORMAL LOW (ref 12.0–15.0)
Hemoglobin: 11.2 g/dL — ABNORMAL LOW (ref 12.0–15.0)
O2 Saturation: 76 %
O2 Saturation: 79 %
Potassium: 4.3 mmol/L (ref 3.5–5.1)
Potassium: 4.3 mmol/L (ref 3.5–5.1)
Sodium: 138 mmol/L (ref 135–145)
Sodium: 138 mmol/L (ref 135–145)
TCO2: 23 mmol/L (ref 22–32)
TCO2: 24 mmol/L (ref 22–32)
pCO2, Ven: 48.9 mmHg (ref 44.0–60.0)
pCO2, Ven: 50.6 mmHg (ref 44.0–60.0)
pH, Ven: 7.251 (ref 7.250–7.430)
pH, Ven: 7.254 (ref 7.250–7.430)
pO2, Ven: 48 mmHg — ABNORMAL HIGH (ref 32.0–45.0)
pO2, Ven: 50 mmHg — ABNORMAL HIGH (ref 32.0–45.0)

## 2021-07-31 LAB — ECHOCARDIOGRAM COMPLETE
Calc EF: 61.4 %
Height: 67 in
MV M vel: 4 m/s
MV Peak grad: 64 mmHg
Radius: 0.9 cm
S' Lateral: 3.3 cm
Single Plane A2C EF: 69.1 %
Single Plane A4C EF: 56.6 %
Weight: 2673.74 oz

## 2021-07-31 LAB — GLUCOSE, CAPILLARY
Glucose-Capillary: 113 mg/dL — ABNORMAL HIGH (ref 70–99)
Glucose-Capillary: 129 mg/dL — ABNORMAL HIGH (ref 70–99)

## 2021-07-31 LAB — CBC
HCT: 33.7 % — ABNORMAL LOW (ref 36.0–46.0)
Hemoglobin: 10.9 g/dL — ABNORMAL LOW (ref 12.0–15.0)
MCH: 31.2 pg (ref 26.0–34.0)
MCHC: 32.3 g/dL (ref 30.0–36.0)
MCV: 96.6 fL (ref 80.0–100.0)
Platelets: 153 10*3/uL (ref 150–400)
RBC: 3.49 MIL/uL — ABNORMAL LOW (ref 3.87–5.11)
RDW: 13.2 % (ref 11.5–15.5)
WBC: 17.9 10*3/uL — ABNORMAL HIGH (ref 4.0–10.5)
nRBC: 0 % (ref 0.0–0.2)

## 2021-07-31 LAB — COOXEMETRY PANEL
Carboxyhemoglobin: 0.7 % (ref 0.5–1.5)
Methemoglobin: 1.1 % (ref 0.0–1.5)
O2 Saturation: 69.1 %
Total hemoglobin: 11 g/dL — ABNORMAL LOW (ref 12.0–16.0)

## 2021-07-31 LAB — LACTIC ACID, PLASMA: Lactic Acid, Venous: 1.2 mmol/L (ref 0.5–1.9)

## 2021-07-31 LAB — SURGICAL PCR SCREEN
MRSA, PCR: NEGATIVE
Staphylococcus aureus: NEGATIVE

## 2021-07-31 LAB — STREP PNEUMONIAE URINARY ANTIGEN: Strep Pneumo Urinary Antigen: NEGATIVE

## 2021-07-31 SURGERY — IABP INSERTION
Anesthesia: LOCAL

## 2021-07-31 MED ORDER — ROCURONIUM BROMIDE 50 MG/5ML IV SOLN
1.0000 mg/kg | Freq: Once | INTRAVENOUS | Status: AC
  Filled 2021-07-31 (×2): qty 7.77

## 2021-07-31 MED ORDER — ROCURONIUM BROMIDE 10 MG/ML (PF) SYRINGE
PREFILLED_SYRINGE | INTRAVENOUS | Status: AC
  Administered 2021-07-31: 80 mg via INTRAVENOUS
  Filled 2021-07-31: qty 10

## 2021-07-31 MED ORDER — DOCUSATE SODIUM 50 MG/5ML PO LIQD
100.0000 mg | Freq: Two times a day (BID) | ORAL | Status: DC
  Administered 2021-07-31 – 2021-08-02 (×4): 100 mg
  Filled 2021-07-31 (×4): qty 10

## 2021-07-31 MED ORDER — ASPIRIN 81 MG PO CHEW
CHEWABLE_TABLET | ORAL | Status: AC
  Filled 2021-07-31: qty 4

## 2021-07-31 MED ORDER — HEPARIN (PORCINE) IN NACL 1000-0.9 UT/500ML-% IV SOLN
INTRAVENOUS | Status: AC
  Filled 2021-07-31: qty 500

## 2021-07-31 MED ORDER — TRAZODONE HCL 50 MG PO TABS
25.0000 mg | ORAL_TABLET | Freq: Once | ORAL | Status: AC
  Administered 2021-07-31: 25 mg via ORAL
  Filled 2021-07-31: qty 1

## 2021-07-31 MED ORDER — SODIUM CHLORIDE 0.9 % IV SOLN
2.0000 g | INTRAVENOUS | Status: DC
  Administered 2021-07-31 – 2021-08-04 (×5): 2 g via INTRAVENOUS
  Filled 2021-07-31 (×5): qty 2

## 2021-07-31 MED ORDER — HEPARIN (PORCINE) 25000 UT/250ML-% IV SOLN
950.0000 [IU]/h | INTRAVENOUS | Status: DC
  Administered 2021-07-31 – 2021-08-02 (×2): 750 [IU]/h via INTRAVENOUS
  Administered 2021-08-03: 950 [IU]/h via INTRAVENOUS
  Filled 2021-07-31 (×5): qty 250

## 2021-07-31 MED ORDER — VANCOMYCIN VARIABLE DOSE PER UNSTABLE RENAL FUNCTION (PHARMACIST DOSING): Status: DC

## 2021-07-31 MED ORDER — POLYETHYLENE GLYCOL 3350 17 G PO PACK
17.0000 g | PACK | Freq: Every day | ORAL | Status: DC
  Administered 2021-08-01 – 2021-08-02 (×2): 17 g
  Filled 2021-07-31 (×2): qty 1

## 2021-07-31 MED ORDER — FENTANYL CITRATE PF 50 MCG/ML IJ SOSY
PREFILLED_SYRINGE | INTRAMUSCULAR | Status: AC
  Administered 2021-07-31: 100 ug via INTRAVENOUS
  Filled 2021-07-31: qty 2

## 2021-07-31 MED ORDER — FENTANYL 2500MCG IN NS 250ML (10MCG/ML) PREMIX INFUSION
25.0000 ug/h | INTRAVENOUS | Status: DC
  Administered 2021-07-31: 50 ug/h via INTRAVENOUS
  Administered 2021-08-01: 150 ug/h via INTRAVENOUS
  Filled 2021-07-31 (×2): qty 250

## 2021-07-31 MED ORDER — ASPIRIN 81 MG PO CHEW
81.0000 mg | CHEWABLE_TABLET | Freq: Every day | ORAL | Status: DC
  Administered 2021-07-31 – 2021-08-02 (×3): 81 mg
  Filled 2021-07-31 (×3): qty 1

## 2021-07-31 MED ORDER — PROPOFOL 1000 MG/100ML IV EMUL
0.0000 ug/kg/min | INTRAVENOUS | Status: DC
Start: 2021-07-31 — End: 2021-07-31

## 2021-07-31 MED ORDER — ORAL CARE MOUTH RINSE: 15.0000 mL | Freq: Two times a day (BID) | OROMUCOSAL | Status: DC

## 2021-07-31 MED ORDER — MIDAZOLAM HCL 2 MG/2ML IJ SOLN
INTRAMUSCULAR | Status: AC
  Administered 2021-07-31: 2 mg
  Filled 2021-07-31: qty 2

## 2021-07-31 MED ORDER — MIDAZOLAM HCL 2 MG/2ML IJ SOLN
INTRAMUSCULAR | Status: DC | PRN
  Administered 2021-07-31: 2 mg via INTRAVENOUS

## 2021-07-31 MED ORDER — FENTANYL BOLUS VIA INFUSION
25.0000 ug | INTRAVENOUS | Status: DC | PRN
  Filled 2021-07-31: qty 100

## 2021-07-31 MED ORDER — ALBUTEROL SULFATE (2.5 MG/3ML) 0.083% IN NEBU
2.5000 mg | INHALATION_SOLUTION | Freq: Four times a day (QID) | RESPIRATORY_TRACT | Status: DC
  Administered 2021-07-31 – 2021-08-02 (×10): 2.5 mg via RESPIRATORY_TRACT
  Filled 2021-07-31 (×9): qty 3

## 2021-07-31 MED ORDER — CHLORHEXIDINE GLUCONATE 0.12% ORAL RINSE (MEDLINE KIT)
15.0000 mL | Freq: Two times a day (BID) | OROMUCOSAL | Status: DC
  Administered 2021-07-31 – 2021-08-01 (×2): 15 mL via OROMUCOSAL

## 2021-07-31 MED ORDER — CHLORHEXIDINE GLUCONATE CLOTH 2 % EX PADS
6.0000 | MEDICATED_PAD | Freq: Every day | CUTANEOUS | Status: DC
  Administered 2021-07-31 – 2021-08-09 (×10): 6 via TOPICAL

## 2021-07-31 MED ORDER — MIDAZOLAM HCL 2 MG/2ML IJ SOLN
1.0000 mg | INTRAMUSCULAR | Status: DC | PRN
  Administered 2021-07-31 – 2021-08-01 (×2): 1 mg via INTRAVENOUS
  Filled 2021-07-31 (×2): qty 2

## 2021-07-31 MED ORDER — LIDOCAINE HCL (PF) 1 % IJ SOLN
INTRAMUSCULAR | Status: DC | PRN
  Administered 2021-07-31: 2 mL
  Administered 2021-07-31: 10 mL

## 2021-07-31 MED ORDER — LIDOCAINE HCL (PF) 1 % IJ SOLN
INTRAMUSCULAR | Status: AC
  Filled 2021-07-31: qty 30

## 2021-07-31 MED ORDER — FENTANYL CITRATE PF 50 MCG/ML IJ SOSY
100.0000 ug | PREFILLED_SYRINGE | Freq: Once | INTRAMUSCULAR | Status: AC
Start: 2021-07-31 — End: 2021-07-31

## 2021-07-31 MED ORDER — MIDAZOLAM HCL 2 MG/2ML IJ SOLN
INTRAMUSCULAR | Status: AC
  Filled 2021-07-31: qty 2

## 2021-07-31 MED ORDER — NOREPINEPHRINE 4 MG/250ML-% IV SOLN
INTRAVENOUS | Status: AC
  Filled 2021-07-31: qty 250

## 2021-07-31 MED ORDER — CHLORHEXIDINE GLUCONATE 0.12% ORAL RINSE (MEDLINE KIT)
15.0000 mL | Freq: Two times a day (BID) | OROMUCOSAL | Status: DC
  Administered 2021-08-01 – 2021-08-04 (×7): 15 mL via OROMUCOSAL

## 2021-07-31 MED ORDER — INSULIN ASPART 100 UNIT/ML IJ SOLN
1.0000 [IU] | INTRAMUSCULAR | Status: DC
  Administered 2021-08-02 – 2021-08-03 (×2): 2 [IU] via SUBCUTANEOUS
  Administered 2021-08-03 (×2): 1 [IU] via SUBCUTANEOUS
  Administered 2021-08-04: 2 [IU] via SUBCUTANEOUS
  Administered 2021-08-04 – 2021-08-05 (×6): 1 [IU] via SUBCUTANEOUS

## 2021-07-31 MED ORDER — FENTANYL CITRATE PF 50 MCG/ML IJ SOSY: 25.0000 ug | PREFILLED_SYRINGE | Freq: Once | INTRAMUSCULAR | Status: DC

## 2021-07-31 MED ORDER — ORAL CARE MOUTH RINSE
15.0000 mL | OROMUCOSAL | Status: DC
  Administered 2021-08-01 – 2021-08-02 (×19): 15 mL via OROMUCOSAL

## 2021-07-31 MED ORDER — ETOMIDATE 2 MG/ML IV SOLN
INTRAVENOUS | Status: AC
  Filled 2021-07-31: qty 20

## 2021-07-31 MED ORDER — ATORVASTATIN CALCIUM 80 MG PO TABS
80.0000 mg | ORAL_TABLET | Freq: Every day | ORAL | Status: DC
  Administered 2021-07-31 – 2021-08-02 (×3): 80 mg
  Filled 2021-07-31 (×3): qty 1

## 2021-07-31 MED ORDER — ORAL CARE MOUTH RINSE
15.0000 mL | OROMUCOSAL | Status: DC
  Administered 2021-07-31 – 2021-08-01 (×6): 15 mL via OROMUCOSAL

## 2021-07-31 MED ORDER — NITROGLYCERIN IN D5W 200-5 MCG/ML-% IV SOLN
INTRAVENOUS | Status: AC
  Filled 2021-07-31: qty 250

## 2021-07-31 MED ORDER — PANTOPRAZOLE SODIUM 40 MG IV SOLR
40.0000 mg | Freq: Every day | INTRAVENOUS | Status: DC
  Administered 2021-08-01: 40 mg via INTRAVENOUS
  Filled 2021-07-31: qty 40

## 2021-07-31 MED ORDER — MIDAZOLAM HCL 2 MG/2ML IJ SOLN
1.0000 mg | INTRAMUSCULAR | Status: DC | PRN
  Administered 2021-07-31: 1 mg via INTRAVENOUS
  Filled 2021-07-31: qty 2

## 2021-07-31 MED ORDER — MIDAZOLAM HCL 2 MG/2ML IJ SOLN
4.0000 mg | Freq: Once | INTRAMUSCULAR | Status: AC
  Administered 2021-07-31: 2 mg via INTRAVENOUS

## 2021-07-31 MED ORDER — CHLORHEXIDINE GLUCONATE 0.12 % MT SOLN: 15.0000 mL | Freq: Two times a day (BID) | OROMUCOSAL | Status: DC

## 2021-07-31 MED ORDER — VANCOMYCIN HCL 1500 MG/300ML IV SOLN
1500.0000 mg | Freq: Once | INTRAVENOUS | Status: AC
  Administered 2021-07-31: 1500 mg via INTRAVENOUS
  Filled 2021-07-31: qty 300

## 2021-07-31 SURGICAL SUPPLY — 12 items
BALLN IABP SENSA PLUS 8F 50CC (BALLOONS) ×2
BALLOON IABP SENS PLUS 8F 50CC (BALLOONS) IMPLANT
CATH SWAN GANZ VIP 7.5F (CATHETERS) ×1 IMPLANT
ELECT DEFIB PAD ADLT CADENCE (PAD) ×1 IMPLANT
KIT ESSENTIALS PG (KITS) ×2 IMPLANT
KIT HEART LEFT (KITS) ×2 IMPLANT
KIT MICROPUNCTURE NIT STIFF (SHEATH) ×1 IMPLANT
MAT PREVALON FULL STRYKER (MISCELLANEOUS) ×1 IMPLANT
PACK CARDIAC CATHETERIZATION (CUSTOM PROCEDURE TRAY) ×2 IMPLANT
SHEATH PINNACLE 8F 10CM (SHEATH) ×1 IMPLANT
SLEEVE REPOSITIONING LENGTH 30 (MISCELLANEOUS) ×1 IMPLANT
TRANSDUCER W/STOPCOCK (MISCELLANEOUS) ×2 IMPLANT

## 2021-07-31 NOTE — Progress Notes (Signed)
PROGRESS NOTE  [name]  [id number] DOB: [birth year] DOA: 07/30/2021 PCP: Javier Glazier, MD   Brief Narrative: [name] is an 80 y.o. female with a history of NPH s/p VP shunt, HTN, depression, RA on leflunomide, chronic prednisone, severe diffuse DJD and mitral regurgitation who presented to the ED 10/19 with acute diffuse weakness. She was hypotensive and modestly hypoxic initially. Labs showed leukocytosis, lactic acidosis, acute renal failure, and troponin elevation >16k. ECG revealed ST changes thought to be nonspecific. CXR revealed opacities and pleural effusions. CT abd/pelvis performed for possible report of abdominal discomfort revealed no significant intraabdominal finding, but confirmed basilar opacities and pleural effusions thought to be consistent with pneumonia. IV fluids and antibiotics were given with improvement in blood pressure and lactate, though patient's respiratory status worsened requiring BiPAP. Cardiology was consulted and patient admitted to [facility]. She continued to require BiPAP and was subsequently transferred to ICU at [facility]. STAT echocardiogram was performed revealing new inferolateral hypokinesis, possible flail mitral valve, severe LAE. LVEF 60-65% but with severe MR, G2DD. Cardiology recommended transfer to [facility] CVICU.  Assessment & Plan: Principal Problem:   Acute respiratory failure with hypoxia (HCC) Active Problems:   Normal pressure hydrocephalus (HCC)   Generalized weakness   PNA (pneumonia)   AKI (acute kidney injury) (HCC)   HTN (hypertension)  Acute respiratory failure with hypoxia, respiratory distress due to flash pulmonary edema +/- LLL PNA:  - Recheck CXR  - Stable for transfer to [place] at [facility] on BiPAP at this time. Looks more stable than earlier this morning during trial off BiPAP.  - Stop IVF - Continuing typical abx coverage with persistence of L base asymmetric opacity and leukocytosis, though clinical scenario fits a  primary cardiac event. Check PCT.  Evolving MI with resultant WMA, papillary muscle rupture acute MR: Troponin downward trending slightly but grossly elevated. No chest discomfort. ECG with artifact in ED does show new ST change in V1.  - Defer to cardiology for management. Transferring to [facility] CVICU for probable TEE, cath, etc. Pt would likely need mechanical ventilation during procedures, consents to this but is otherwise clear that she would not desire intubation or resuscitation.   Acute heart failure: Maintaining peripheral perfusion at this time.  - Will be assessed by advanced heart failure team shortly. I've confirmed she has a bed in 2H01.  Acute renal failure: Hyaline casts on UA without RBCs or WBCs, no hydronephrosis on CT A/P. Slightly improved, but baseline is normal.  - Given fluids which we will stop for now  LFT elevation: Suspect acute congestive hepatopathy without significant abnormality on CT.   RA:  - Holding leflunomide until we know more about infection status.  - Continue prednisone for now, though may require stress steroids.   NPH: VP shunt in good position on CT with slight ventricular enlargement from prior exam biventricular measurement 73 > 74.69m.   HTN: Holding labetalol, norvasc home meds with recent hypotension  Code Status: DNR Family Communication: Husband at bedside, Son by phone. Disposition Plan:  Status is: Inpatient  Remains inpatient appropriate because: Critically ill  Consultants:  PCCM Cardiology  Procedures:  Echo:  1. Left ventricular ejection fraction, by estimation, is 60 to 65%. The  left ventricle has normal function. The left ventricle demonstrates  regional wall motion abnormalities with mid inferolateral hypokinesis.  Left ventricular diastolic parameters are  consistent with Grade II diastolic dysfunction (pseudonormalization).   2. Right ventricular systolic function is normal. The right ventricular  size is normal. There  is severely elevated pulmonary artery systolic  pressure. The estimated right ventricular systolic pressure is [id number] mmHg.   3. Left atrial size was severely dilated.   4. Right atrial size was mildly dilated.   5. The mitral valve is abnormal. Severe mitral valve regurgitation. MR is  highly eccentric and posteriorly directed. In the parasternal long axis  view, I question possible A2 partial flail. There is splay artifact noted.  PISA ERO 0.39 cm^2 (not likely  to be completely accurate as off axis). No evidence of mitral stenosis.   6. The aortic valve is tricuspid. Aortic valve regurgitation is not  visualized. No aortic stenosis is present.   7. The inferior vena cava is dilated in size with <50% respiratory  variability, suggesting right atrial pressure of 15 mmHg.   Antimicrobials: Ceftriaxone, azithromycin.   Subjective: Wanted to have coffee but was too short of breath when off BiPAP. Denies chest pain or localized pain elsewhere.   Objective: Vitals:   07/31/21 0914 07/31/21 1048 07/31/21 1100 07/31/21 1112  BP:  122/67 124/66   Pulse:  (!) 102 95 92  Resp:  (!) 28 (!) 23 (!) 26  Temp:  99.7 F (37.6 C)    TempSrc:  Axillary    SpO2: 95% 93% 95% 96%  Weight:  77.7 kg    Height:  '5\' 7"'$  (1.702 m)      Intake/Output Summary (Last 24 hours) at 07/31/2021 1136 Last data filed at 07/31/2021 1107 Gross per 24 hour  Intake 2122.08 ml  Output 150 ml  Net 1972.08 ml   Filed Weights   07/31/21 0100 07/31/21 1048  Weight: 75.8 kg 77.7 kg   Gen: Elderly female in no distress Pulm: Labored tachypnea with belly breathing when on supplemental oxygen off BiPAP this AM much improved effort but still tachypneic when placed back on BiPAP. Diminished without wheeze or crackle.  CV: Regular, soft systolic murmur at apex but distant S1S2.   GI: Abdomen soft, non-tender, non-distended, with normoactive bowel sounds. No organomegaly or masses felt. Ext: Warm, dry without peripheral  edema. No deformities Skin: No rashes, lesions or ulcers Neuro: Alert and oriented. No focal neurological deficits. Psych: Judgement and insight appear normal. Mood & affect appropriate.   Data Reviewed: I have personally reviewed following labs and imaging studies  CBC: Recent Labs  Lab 07/30/21 1351 07/31/21 0453  WBC 20.4* 17.9*  NEUTROABS 12.8*  --   HGB 12.2 10.9*  HCT 35.7* 33.7*  MCV 93.0 96.6  PLT 212 0000000   Basic Metabolic Panel: Recent Labs  Lab 07/30/21 1351 07/31/21 0453  NA 133* 135  K 4.4 4.2  CL 102 104  CO2 18* 18*  GLUCOSE 136* 133*  BUN 62* 60*  CREATININE 4.43* 3.81*  CALCIUM 9.5 8.8*   GFR: Estimated Creatinine Clearance: 12.4 mL/min (A) (by C-G formula based on SCr of 3.81 mg/dL (H)). Liver Function Tests: Recent Labs  Lab 07/30/21 1351 07/31/21 0453  AST 112* 79*  ALT 31 30  ALKPHOS 58 51  BILITOT 1.0 1.0  PROT 6.9 6.3*  ALBUMIN 4.2 3.6   No results for input(s): LIPASE, AMYLASE in the last 168 hours. No results for input(s): AMMONIA in the last 168 hours. Coagulation Profile: Recent Labs  Lab 07/30/21 1351  INR 1.0   Cardiac Enzymes: No results for input(s): CKTOTAL, CKMB, CKMBINDEX, TROPONINI in the last 168 hours. BNP (last 3 results) No results for input(s): PROBNP in the last  8760 hours. HbA1C: No results for input(s): HGBA1C in the last 72 hours. CBG: No results for input(s): GLUCAP in the last 168 hours. Lipid Profile: No results for input(s): CHOL, HDL, LDLCALC, TRIG, CHOLHDL, LDLDIRECT in the last 72 hours. Thyroid Function Tests: Recent Labs    07/30/21 1450  TSH 1.050  FREET4 0.90   Anemia Panel: No results for input(s): VITAMINB12, FOLATE, FERRITIN, TIBC, IRON, RETICCTPCT in the last 72 hours. Urine analysis:    Component Value Date/Time   COLORURINE YELLOW 07/30/2021 1351   APPEARANCEUR HAZY (A) 07/30/2021 1351   LABSPEC 1.030 07/30/2021 1351   PHURINE 5.0 07/30/2021 1351   GLUCOSEU NEGATIVE 07/30/2021  1351   HGBUR NEGATIVE 07/30/2021 1351   BILIRUBINUR SMALL (A) 07/30/2021 1351   KETONESUR NEGATIVE 07/30/2021 1351   PROTEINUR 30 (A) 07/30/2021 1351   UROBILINOGEN 1.0 02/18/2015 1630   NITRITE NEGATIVE 07/30/2021 1351   LEUKOCYTESUR NEGATIVE 07/30/2021 1351   Recent Results (from the past 240 hour(s))  Resp Panel by RT-PCR (Flu A&B, Covid) Nasopharyngeal Swab     Status: None   Collection Time: 07/30/21  1:54 PM   Specimen: Nasopharyngeal Swab; Nasopharyngeal(NP) swabs in vial transport medium  Result Value Ref Range Status   SARS Coronavirus 2 by RT PCR NEGATIVE NEGATIVE Final    Comment: (NOTE) SARS-CoV-2 target nucleic acids are NOT DETECTED.  The SARS-CoV-2 RNA is generally detectable in upper respiratory specimens during the acute phase of infection. The lowest concentration of SARS-CoV-2 viral copies this assay can detect is 138 copies/mL. A negative result does not preclude SARS-Cov-2 infection and should not be used as the sole basis for treatment or other patient management decisions. A negative result may occur with  improper specimen collection/handling, submission of specimen other than nasopharyngeal swab, presence of viral mutation(s) within the areas targeted by this assay, and inadequate number of viral copies(<138 copies/mL). A negative result must be combined with clinical observations, patient history, and epidemiological information. The expected result is Negative.  Fact Sheet for Patients:  EntrepreneurPulse.com.au  Fact Sheet for Healthcare Providers:  IncredibleEmployment.be  This test is no t yet approved or cleared by the [place] FDA and  has been authorized for detection and/or diagnosis of SARS-CoV-2 by FDA under an Emergency Use Authorization (EUA). This EUA will remain  in effect (meaning this test can be used) for the duration of the COVID-19 declaration under Section 564(b)(1) of the Act,  21 U.S.C.section 360bbb-3(b)(1), unless the authorization is terminated  or revoked sooner.       Influenza A by PCR NEGATIVE NEGATIVE Final   Influenza B by PCR NEGATIVE NEGATIVE Final    Comment: (NOTE) The Xpert Xpress SARS-CoV-2/FLU/RSV plus assay is intended as an aid in the diagnosis of influenza from Nasopharyngeal swab specimens and should not be used as a sole basis for treatment. Nasal washings and aspirates are unacceptable for Xpert Xpress SARS-CoV-2/FLU/RSV testing.  Fact Sheet for Patients: EntrepreneurPulse.com.au  Fact Sheet for Healthcare Providers: IncredibleEmployment.be  This test is not yet approved or cleared by the [place] FDA and has been authorized for detection and/or diagnosis of SARS-CoV-2 by FDA under an Emergency Use Authorization (EUA). This EUA will remain in effect (meaning this test can be used) for the duration of the COVID-19 declaration under Section 564(b)(1) of the Act, 21 U.S.C. section 360bbb-3(b)(1), unless the authorization is terminated or revoked.  Performed at [facility], [address] [place], [place] [address]  Radiology Studies: CT ABDOMEN PELVIS WO CONTRAST  Result Date: 07/30/2021 CLINICAL DATA:  Acute abdominal pain. EXAM: CT ABDOMEN AND PELVIS WITHOUT CONTRAST TECHNIQUE: Multidetector CT imaging of the abdomen and pelvis was performed following the standard protocol without IV contrast. COMPARISON:  None. FINDINGS: Lower chest: There are small bilateral pleural effusions. There is some patchy airspace disease in the left lower lobe and atelectasis in the right lower lobe. Hepatobiliary: No focal liver abnormality is seen. No gallstones, gallbladder wall thickening, or biliary dilatation. Pancreas: Unremarkable. No pancreatic ductal dilatation or surrounding inflammatory changes. Spleen: Normal in size without focal abnormality. Adrenals/Urinary Tract: There are small  left peripelvic cysts. The kidneys, adrenal glands, ureters and bladder are otherwise within normal limits. Stomach/Bowel: Stomach is within normal limits. Appendix appears normal. No evidence of bowel wall thickening, distention, or inflammatory changes. There is sigmoid colon diverticulosis without evidence for acute diverticulitis. Vascular/Lymphatic: Aortic atherosclerosis. No enlarged abdominal or pelvic lymph nodes. Reproductive: Uterus and bilateral adnexa are unremarkable. Other: There is a small amount of free fluid in the pelvis. VP shunt catheter is present entering the right abdomen with distal catheter tip located in the right pelvis. The visualized catheter appears intact. There is a small fat containing umbilical hernia. Musculoskeletal: Multilevel degenerative changes affect the spine. There is mild chronic compression deformity of the superior endplate of 624THL. There is a healed right inferior pubic ramus fracture. There are severe degenerative changes of both hips. No acute fractures are identified. IMPRESSION: 1. Small amount of free fluid in the pelvis. 2. 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 3. Colonic diverticulosis without evidence for diverticulitis. 4. Small bilateral pleural effusions with left lower lobe airspace disease worrisome for infection. 5.  Aortic Atherosclerosis (ICD10-I70.0). Electronically Signed   By: Ronney Asters M.D.   On: 07/30/2021 15:36   CT Head Wo Contrast  Result Date: 07/30/2021 CLINICAL DATA:  Delirium. Bilateral leg weakness. Altered mental status. Shunt. EXAM: CT HEAD WITHOUT CONTRAST TECHNIQUE: Contiguous axial images were obtained from the base of the skull through the vertex without intravenous contrast. COMPARISON:  11/09/2018 FINDINGS: Brain: Right frontal ventricular shunt catheter in the right lateral ventricle unchanged. Moderate ventricular enlargement most notably of the third and lateral ventricles. Lateral ventricle size slightly larger. Biventricular  diameter now 74.5 mm compared with 73 mm previously. Mild periventricular white matter hypodensity. Negative for acute infarct, hemorrhage, mass Vascular: Negative for hyperdense vessel Skull: No acute abnormality Sinuses/Orbits: Paranasal sinuses clear. Bilateral cataract extraction Other: None IMPRESSION: Right frontal shunt catheter unchanged. Slight progression of ventricle enlargement. No acute abnormality. Electronically Signed   By: Franchot Gallo M.D.   On: 07/30/2021 14:18   DG CHEST PORT 1 VIEW  Result Date: 07/31/2021 CLINICAL DATA:  80 year old female with respiratory failure. EXAM: PORTABLE CHEST 1 VIEW COMPARISON:  Portable chest 07/30/2021 and earlier. FINDINGS: Portable AP semi upright view at 912 hours. Stable lung volumes and mediastinal contours. Increasing left lung base opacity, now partially obscuring the left hemidiaphragm. Right chest shunt catheter redemonstrated. No pneumothorax. No pulmonary edema or definite effusion. Partially visible cervical ACDF. No acute osseous abnormality identified. IMPRESSION: 1. Increasing left lung base opacity since yesterday compatible with progressive atelectasis or infection. 2. No other acute cardiopulmonary abnormality identified. Electronically Signed   By: Genevie Ann M.D.   On: 07/31/2021 09:34   DG Chest Port 1 View  Result Date: 07/30/2021 CLINICAL DATA:  Possible sepsis.  Leg weakness. EXAM: PORTABLE CHEST 1 VIEW COMPARISON:  07/20/2017. FINDINGS: Trachea is  midline. Heart size stable. Thoracic aorta is calcified. Shunt catheter projects over the midline chest. Biapical pleural thickening. Bibasilar subsegmental volume loss. Probable small left pleural effusion. IMPRESSION: 1. Probable small left pleural effusion. 2. Bibasilar atelectasis. Electronically Signed   By: Lorin Picket M.D.   On: 07/30/2021 14:04   ECHOCARDIOGRAM COMPLETE  Result Date: 07/31/2021    ECHOCARDIOGRAM REPORT   Patient Name:   [name] Date of Exam:  07/31/2021 Medical Rec #:  [id number]              Height:       67.0 in Accession #:    [id number]             Weight:       167.1 lb Date of Birth:  [birth year]               BSA:          1.874 m Patient Age:    69 years               BP:           108/61 mmHg Patient Gender: F                      HR:           96 bpm. Exam Location:  Inpatient Procedure: 2D Echo, Cardiac Doppler and Color Doppler Indications:     Chest pain  History:         Patient has no prior history of Echocardiogram examinations.                  Risk Factors:Hypertension.  Sonographer:     Helmut Muster Referring Phys:  [place] Diagnosing Phys: Franki Monte IMPRESSIONS  1. Left ventricular ejection fraction, by estimation, is 60 to 65%. The left ventricle has normal function. The left ventricle demonstrates regional wall motion abnormalities with mid inferolateral hypokinesis. Left ventricular diastolic parameters are consistent with Grade II diastolic dysfunction (pseudonormalization).  2. Right ventricular systolic function is normal. The right ventricular size is normal. There is severely elevated pulmonary artery systolic pressure. The estimated right ventricular systolic pressure is [id number] mmHg.  3. Left atrial size was severely dilated.  4. Right atrial size was mildly dilated.  5. The mitral valve is abnormal. Severe mitral valve regurgitation. MR is highly eccentric and posteriorly directed. In the parasternal long axis view, I question possible A2 partial flail. There is splay artifact noted. PISA ERO 0.39 cm^2 (not likely to be completely accurate as off axis). No evidence of mitral stenosis.  6. The aortic valve is tricuspid. Aortic valve regurgitation is not visualized. No aortic stenosis is present.  7. The inferior vena cava is dilated in size with <50% respiratory variability, suggesting right atrial pressure of 15 mmHg. FINDINGS  Left Ventricle: Left ventricular ejection fraction, by estimation, is 60 to 65%. The  left ventricle has normal function. The left ventricle demonstrates regional wall motion abnormalities. The left ventricular internal cavity size was normal in size. There is no left ventricular hypertrophy. Left ventricular diastolic parameters are consistent with Grade II diastolic dysfunction (pseudonormalization). Right Ventricle: The right ventricular size is normal. No increase in right ventricular wall thickness. Right ventricular systolic function is normal. There is severely elevated pulmonary artery systolic pressure. The tricuspid regurgitant velocity is 3.49 m/s, and with an assumed right atrial pressure of 15 mmHg, the estimated right ventricular systolic pressure  is 63.7 mmHg. Left Atrium: Left atrial size was severely dilated. Right Atrium: Right atrial size was mildly dilated. Pericardium: There is no evidence of pericardial effusion. Mitral Valve: The mitral valve is abnormal. Severe mitral valve regurgitation. No evidence of mitral valve stenosis. Tricuspid Valve: The tricuspid valve is normal in structure. Tricuspid valve regurgitation is mild. Aortic Valve: The aortic valve is tricuspid. Aortic valve regurgitation is not visualized. No aortic stenosis is present. Pulmonic Valve: The pulmonic valve was normal in structure. Pulmonic valve regurgitation is not visualized. Aorta: The aortic root is normal in size and structure. Venous: The inferior vena cava is dilated in size with less than 50% respiratory variability, suggesting right atrial pressure of 15 mmHg. IAS/Shunts: No atrial level shunt detected by color flow Doppler.  LEFT VENTRICLE PLAX 2D LVIDd:         4.80 cm     Diastology LVIDs:         3.30 cm     LV e' lateral: 9.25 cm/s LV PW:         0.80 cm LV IVS:        0.80 cm LVOT diam:     2.10 cm LV SV:         68 LV SV Index:   36 LVOT Area:     3.46 cm  LV Volumes (MOD) LV vol d, MOD A2C: 51.8 ml LV vol d, MOD A4C: 60.2 ml LV vol s, MOD A2C: 16.0 ml LV vol s, MOD A4C: 26.1 ml LV SV MOD  A2C:     35.8 ml LV SV MOD A4C:     60.2 ml LV SV MOD BP:      35.9 ml RIGHT VENTRICLE             IVC RV S prime:     21.40 cm/s  IVC diam: 2.50 cm TAPSE (M-mode): 2.1 cm LEFT ATRIUM              Index        RIGHT ATRIUM           Index LA diam:        4.10 cm  2.19 cm/m   RA Area:     18.20 cm LA Vol (A2C):   95.7 ml  51.08 ml/m  RA Volume:   50.20 ml  26.79 ml/m LA Vol (A4C):   126.0 ml 67.25 ml/m LA Biplane Vol: 108.0 ml 57.64 ml/m  AORTIC VALVE LVOT Vmax:   120.00 cm/s LVOT Vmean:  67.500 cm/s LVOT VTI:    0.195 m  AORTA Ao Root diam: 3.40 cm Ao Asc diam:  3.30 cm MR Peak grad:    64.0 mmHg    TRICUSPID VALVE MR Mean grad:    41.0 mmHg    TR Peak grad:   48.7 mmHg MR Vmax:         400.00 cm/s  TR Vmax:        349.00 cm/s MR Vmean:        300.0 cm/s MR PISA:         5.09 cm     SHUNTS MR PISA Eff ROA: 39 mm       Systemic VTI:  0.20 m MR PISA Radius:  0.90 cm      Systemic Diam: 2.10 cm Dalton McleanMD Electronically signed by Franki Monte Signature Date/Time: 07/31/2021/10:52:39 AM    Final (Updated)     Scheduled Meds:  albuterol  2.5 mg Nebulization  Q6H   chlorhexidine  15 mL Mouth Rinse BID   Chlorhexidine Gluconate Cloth  6 each Topical Daily   heparin  5,000 Units Subcutaneous Q8H   mouth rinse  15 mL Mouth Rinse q12n4p   multivitamin with minerals  1 tablet Oral Daily   predniSONE  2 mg Oral Daily   sodium chloride flush  3 mL Intravenous Q12H   Continuous Infusions:  azithromycin     cefTRIAXone (ROCEPHIN)  IV       LOS: 1 day   Time spent: 35 minutes.  Patrecia Pour, MD [facility] [email or web address] 07/31/2021, 11:36 AM

## 2021-07-31 NOTE — H&P (View-Only) (Signed)
NAME:  [name], MRN:  [id number], DOB:  August 03, 1941, LOS: 1 ADMISSION DATE:  07/30/2021, CONSULTATION DATE:  07/31/21 REFERRING MD:  Dr. Beryl Meager, CHIEF COMPLAINT:  Weakness   History of Present Illness:  80 y/o F who presented to [facility] on 10/19 with reports of weakness.   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  She reported on presentation that she had been feeling increasingly weak over the preceding four days.  She went to get a flu shot two days ago and seemed somewhat altered per family.  The weakness progressed to the point she could not sit or stand on her own which is a marked change from her baseline.  In the ER she reported shortness of breath & vague abdominal pain prior to presentation.  Initial ER evaluation notable for SBP 80-100's and new O2 need of 2-6L.  She was transitioned to BiPAP support after IVF.  Labs showed Na 133, Bicarb 18, BUN 62, Cr 4.43 (baseline 0.7), AST 112, WBC 20.4, troponin of 16, 652, lactic acid 2.62.  She was negative for COVID and influenza.  CXR showed small left pleural effusion, mild atelectasis on right & bibasilar atelectasis.  EKG showed ST changes thought to be non-specific.  She was admitted per [facility] for further work up with working diagnosis of possible PNA / infectious etiology of weakness.  She had improvement in BP and lactate with IVF + abx.  Subsequent ECHO showed an LVEF of 60-65%, LV with regional wall motion abnormalities with mid inferolateral hypokinesis, grade II diastolic dysfunction, RVSP of 63.7, LA severely dilated, RA mildly dilated, question of flail mitral valve.  ECHO findings concerning for MI with papillary muscle rupture and severe MR.  She remained on BiPAP for shortness of breath.     PCCM consulted for pulmonary evaluation.   Pertinent  Medical History  Degenerative Disease  Normal Pressure Hydrocephalus s/p Stent Neuropathy  Vertigo  HLD Depression  Former Smoker - smoked total 20 years RA  - on leflunomide, prednisone  Retired Therapist, sports   Significant Hospital Events: Including procedures, antibiotic start and stop dates in addition to other pertinent events   10/19 Admit with weakness, SOB, elevated troponin  10/20 PCCM consulted, ECHO with possible findings of MI with papillary muscle rupture, severe MR. Tx to [facility] for TTE.  May need intubation for procedure.   Interim History / Subjective:  Tmax 99.7 Pt denies chest pain, pain in neck/shoulders/back/ stomach Reports shortness of breath is improved on bipap   Objective   Blood pressure 124/66, pulse 92, temperature 99.7 F (37.6 C), temperature source Axillary, resp. rate (!) 26, height '5\' 7"'$  (1.702 m), weight 77.7 kg, SpO2 96 %.    FiO2 (%):  [40 %] 40 %   Intake/Output Summary (Last 24 hours) at 07/31/2021 1131 Last data filed at 07/31/2021 1107 Gross per 24 hour  Intake 2122.08 ml  Output 150 ml  Net 1972.08 ml   Filed Weights   07/31/21 0100 07/31/21 1048  Weight: 75.8 kg 77.7 kg    Examination: General: pleasant adult female lying in bed on BiPAP in NAD, family at bedside  HENT: MM pink/dry, bipap mask in place, anicteric Lungs: non-labored on bipap, diminished breath sounds bilaterally, occasional wheeze, decreased bases Cardiovascular: S1S2 regular, distant tones Abdomen: soft/non-tender, bsx4 active  Extremities: warm/dry, no peripheral edema  Neuro: AAOx4, speech clear, MAE, normal strength     Resolved Hospital Problem list  Assessment & Plan:   Suspected evolving Acute MI with Papillary Muscle Rupture, Severe MR Acute Heart Failure  Wall motion abnormalities on ECHO, progression to severe MR findings worrisome for papillary muscle rupture.  Baseline MV disease but progression noted on ECHO.  -transfer to [facility] for cardiac evaluation  -likely will need TEE -follow troponin trend, BNP  -appreciate Cardiology evaluation and assistance with patient care  Acute Hypoxic Respiratory Failure  Left  Pleural Effusion with Atelectasis  In setting of edema, atelectasis. Doubt PNA.  -BiPAP support for to ease respiratory burden  -wean O2 for sats >90% -may need to be intubated for TEE  -assess PCT, doubt infectious process. More likely related to acute cardiac issues. If negative, stop rocephin.   AKI Baseline renal function normal.  Suspect pre-renal.   -Trend BMP / urinary output -Replace electrolytes as indicated -Avoid nephrotoxic agents, ensure adequate renal perfusion  Elevated LFT's  Likely congestive hepatopathy.  -follow trend   Leukocytosis  Immune Suppressed  Doubt infectious etiology, more likely stress response from MI.  Note immune suppression with RA therapy.  -follow trend -plan to de-escalate abx pending PCT review -follow blood cultures to maturity   Hx HTN -hold home agents with soft normal BP's  Rheumatoid Arthritis  -continue prednisone -hold leflunomide  -if worsening of LFT's, would hold leflunomide  Normal Pressure Hydrocephalus  Hx shunt. CT head with shunt in place, slight enlargement of ventricles.  CT Abd/Pelvis with small amt free fluid in abdomen.  -follow exam, no acute interventions   Best Practice (right click and "Reselect all SmartList Selections" daily)  Diet/type: NPO DVT prophylaxis: prophylactic heparin  GI prophylaxis: N/A Lines: N/A Foley:  N/A Code Status:  DNR/DNI.  Patient accepting of short term intubation if needed for procedure.  Last date of multidisciplinary goals of care discussion: family updated 10/20 at bedside on plan of care.   Labs   CBC: Recent Labs  Lab 07/30/21 1351 07/31/21 0453  WBC 20.4* 17.9*  NEUTROABS 12.8*  --   HGB 12.2 10.9*  HCT 35.7* 33.7*  MCV 93.0 96.6  PLT 212 0000000    Basic Metabolic Panel: Recent Labs  Lab 07/30/21 1351 07/31/21 0453  NA 133* 135  K 4.4 4.2  CL 102 104  CO2 18* 18*  GLUCOSE 136* 133*  BUN 62* 60*  CREATININE 4.43* 3.81*  CALCIUM 9.5 8.8*   GFR: Estimated  Creatinine Clearance: 12.4 mL/min (A) (by C-G formula based on SCr of 3.81 mg/dL (H)). Recent Labs  Lab 07/30/21 1351 07/30/21 1550 07/31/21 0453  WBC 20.4*  --  17.9*  LATICACIDVEN 2.6* 2.0*  --     Liver Function Tests: Recent Labs  Lab 07/30/21 1351 07/31/21 0453  AST 112* 79*  ALT 31 30  ALKPHOS 58 51  BILITOT 1.0 1.0  PROT 6.9 6.3*  ALBUMIN 4.2 3.6   No results for input(s): LIPASE, AMYLASE in the last 168 hours. No results for input(s): AMMONIA in the last 168 hours.  ABG No results found for: PHART, PCO2ART, PO2ART, HCO3, TCO2, ACIDBASEDEF, O2SAT   Coagulation Profile: Recent Labs  Lab 07/30/21 1351  INR 1.0    Cardiac Enzymes: No results for input(s): CKTOTAL, CKMB, CKMBINDEX, TROPONINI in the last 168 hours.  HbA1C: No results found for: HGBA1C  CBG: No results for input(s): GLUCAP in the last 168 hours.  Review of Systems: Positives in [place]  Gen: Denies fever, chills, weight change, fatigue, night sweats, weakness HEENT: Denies blurred vision, double  vision, hearing loss, tinnitus, sinus congestion, rhinorrhea, sore throat, neck stiffness, dysphagia PULM: Denies shortness of breath, cough, sputum production, hemoptysis, wheezing CV: Denies chest pain, edema, orthopnea, paroxysmal nocturnal dyspnea, palpitations GI: Denies abdominal pain, nausea, vomiting, diarrhea, hematochezia, melena, constipation, change in bowel habits GU: Denies dysuria, hematuria, polyuria, oliguria, urethral discharge Endocrine: Denies hot or cold intolerance, polyuria, polyphagia or appetite change Derm: Denies rash, dry skin, scaling or peeling skin change Heme: Denies easy bruising, bleeding, bleeding gums Neuro: Denies headache, numbness, weakness, slurred speech, loss of memory or consciousness. Mild confusion.   Past Medical History:  She,  has a past medical history of Abnormality of gait (04/17/2013), Cervical spondylosis, Cervicogenic headache, Depression, Dyslipidemia,  Gait disorder, Hyperlipidemia, Hypertension, Normal pressure hydrocephalus ([place]), Occipital neuralgia (04/17/2013), Osteomyelitis of left knee region [facility]), Rheumatoid arthritis(714.0), and Vertigo (02/19/2015).   Surgical History:   Past Surgical History:  Procedure Laterality Date   CERVICAL SPINE SURGERY     JOINT REPLACEMENT     LUMBAR SPINE SURGERY     OVARY SURGERY     REPLACEMENT TOTAL KNEE Left    TOTAL KNEE ARTHROPLASTY Right 04/29/2020   Procedure: TOTAL KNEE ARTHROPLASTY;  Surgeon: [name], MD;  Location: [facility] ORS;  Service: Orthopedics;  Laterality: Right;  14mn   VENTRICULO-PERITONEAL SHUNT PLACEMENT / LAPAROSCOPIC INSERTION PERITONEAL CATHETER       Social History:   reports that she has quit smoking. She has never used smokeless tobacco. She reports that she does not currently use alcohol. She reports that she does not use drugs.   Family History:  Her family history includes Brain cancer in her sister; Heart disease in her father; Ovarian cancer in her mother.   Allergies Allergies  Allergen Reactions   Methotrexate Derivatives Other (See Comments)    Dangerously decreased platelet count   Penicillins Itching    Tolerated Cephalosporin Date: 04/30/20.     Sulfa Antibiotics Itching     Home Medications  Prior to Admission medications   Medication Sig Start Date End Date Taking? Authorizing Provider  acetaminophen (TYLENOL) 500 MG tablet Take 1,000 mg by mouth 3 (three) times daily as needed for moderate pain.   Yes [provider]  acetaminophen-codeine (TYLENOL #3) 300-30 MG tablet Take 1 tablet by mouth every 8 (eight) hours as needed for moderate pain.   Yes [provider]  alendronate (FOSAMAX) 70 MG tablet Take 70 mg by mouth once a week. 01/12/20  Yes [provider]  amLODipine (NORVASC) 10 MG tablet Take 10 mg by mouth daily. 06/27/21  Yes [provider]  labetalol (NORMODYNE) 100 MG tablet Take 100 mg by mouth 2  (two) times daily.    Yes [provider]  latanoprost (XALATAN) 0.005 % ophthalmic solution Place 1 drop into both eyes at bedtime.  08/02/17  Yes [provider]  leflunomide (ARAVA) 10 MG tablet Take 10 mg by mouth daily.   Yes [provider]  meclizine (ANTIVERT) 25 MG tablet Take 1 tablet (25 mg total) by mouth 3 (three) times daily as needed for dizziness. 02/18/15  Yes RQuintella Reichert MD  Multiple Vitamin (MULTIVITAMIN) tablet Take 1 tablet by mouth daily.   Yes [provider]  ondansetron (ZOFRAN) 4 MG tablet Take 1 tablet (4 mg total) by mouth every 6 (six) hours. Patient taking differently: Take 4 mg by mouth every 8 (eight) hours as needed for nausea or vomiting. 02/18/15  Yes RQuintella Reichert MD  predniSONE (DELTASONE) 1 MG tablet  Take 2 mg by mouth daily.   Yes [provider]  traZODone (DESYREL) 50 MG tablet Take 1 tablet (50 mg total) by mouth at bedtime. 11/07/20  Yes Kathrynn Ducking, MD     Critical care time: 95 minutes     Noe Gens, MSN, APRN, NP-C, AGACNP-BC [facility] 07/31/2021, 12:12 PM   Please see [email or web address] for pager details.   From 7A-7P if no response, please call [phone] After hours, please call ELink [phone]

## 2021-07-31 NOTE — Progress Notes (Signed)
Report called to carelink and Arthor Captain, 2H RN. All questions answered at this time. All pt belongings sent with patient. Medical necessity completed. Paperwork given to carelink by ICU Network engineer. Carelink will continue to care for pt.

## 2021-07-31 NOTE — Progress Notes (Signed)
Pt transported from [place] to cath lab via vent w/ no apparent complications.

## 2021-07-31 NOTE — Interval H&P Note (Signed)
History and Physical Interval Note:  07/31/2021 6:24 PM  [name]  has presented today for surgery, with the diagnosis of heart failure.  The various methods of treatment have been discussed with the patient and family. After consideration of risks, benefits and other options for treatment, the patient has consented to  Procedure(s): IABP INSERTION (N/A) as a surgical intervention.  The patient's history has been reviewed, patient examined, no change in status, stable for surgery.  I have reviewed the patient's chart and labs.  Questions were answered to the patient's satisfaction.     Kamill Fulbright [employer]

## 2021-07-31 NOTE — Progress Notes (Signed)
ANTICOAGULATION CONSULT NOTE  Pharmacy Consult for heparin Indication:  IABP  Allergies  Allergen Reactions   Methotrexate Derivatives Other (See Comments)    Dangerously decreased platelet count   Penicillins Itching    Tolerated Cephalosporin Date: 04/30/20.     Sulfa Antibiotics Itching    Patient Measurements: Height: '5\' 7"'$  (170.2 cm) Weight: 77.7 kg (171 lb 4.8 oz) IBW/kg (Calculated) : 61.6 Heparin Dosing Weight: 77kg  Vital Signs: Temp: 99.7 F (37.6 C) (10/20 1048) Temp Source: Axillary (10/20 1048) BP: 104/69 (10/20 1937) Pulse Rate: 94 (10/20 1937)  Labs: Recent Labs    07/30/21 1351 07/30/21 1624 07/30/21 1945 07/30/21 2119 07/31/21 0453 07/31/21 1556  HGB 12.2  --   --   --  10.9* 11.6*  HCT 35.7*  --   --   --  33.7* 34.0*  PLT 212  --   --   --  153  --   APTT 23*  --   --   --   --   --   LABPROT 13.4  --   --   --   --   --   INR 1.0  --   --   --   --   --   CREATININE 4.43*  --   --   --  3.81*  --   TROPONINIHS [id number]* 16,254* 13,992* 14,937*  --   --     Estimated Creatinine Clearance: 12.4 mL/min (A) (by C-G formula based on SCr of 3.81 mg/dL (H)).   Medical History: Past Medical History:  Diagnosis Date   Abnormality of gait 04/17/2013   Cervical spondylosis    Cervicogenic headache    Right occipital neuralgia   Depression    Dyslipidemia    Gait disorder    Hyperlipidemia    Hypertension    Normal pressure hydrocephalus (HCC)    Occipital neuralgia 04/17/2013   right   Osteomyelitis of left knee region [facility])    Rheumatoid arthritis(714.0)    Vertigo 02/19/2015    Assessment: 22 yoF admitted with cardiogenic shock 2/2 severe MR. IABP placed in cath lab, pharmacy asked to start IV heparin. No AC PTA, CBC wnl this am.  Goal of Therapy:  Heparin level 0.2-0.5 units/ml Monitor platelets by anticoagulation protocol: Yes   Plan:  Heparin 750 units/h Check heparin level in 8h  Arrie Senate, PharmD, [place], [facility] Clinical  Pharmacist [phone] Please check AMION for all [place] numbers 07/31/2021

## 2021-07-31 NOTE — Progress Notes (Signed)
RT NOTE:  Pt transported to 2H04 without event. Report given to [place], RT.

## 2021-07-31 NOTE — Procedures (Signed)
Central Venous Catheter Insertion Procedure Note  Shanette Czarniak  [id number]  1941/08/16  Date:07/31/21  Time:3:32 PM   Provider Performing:Armie Moren E Shameer Molstad   Procedure: Insertion of Non-tunneled Central Venous [phone]) with US guidance [id number])   Indication(s) Medication administration  Consent Risks of the procedure as well as the alternatives and risks of each were explained to the patient and/or caregiver.  Consent for the procedure was obtained and is signed in the bedside chart -- Verbal consent from pt son Dr. Ashok Cordia   Anesthesia Topical 1% lidocaine . Systemic versed and fentanyl   Timeout Verified patient identification, verified procedure, site/side was marked, verified correct patient position, special equipment/implants available, medications/allergies/relevant history reviewed, required imaging and test results available.  Sterile Technique Maximal sterile technique including full sterile barrier drape, hand hygiene, sterile gown, sterile gloves, mask, hair covering, sterile ultrasound probe cover (if used).  Procedure Description Area of catheter insertion was cleaned with chlorhexidine and draped in sterile fashion.  With real-time ultrasound guidance a central venous catheter was placed into the left internal jugular vein. Guidewire placement confirmed on ultrasound. Nonpulsatile blood flow and easy flushing noted in all ports.  The catheter was sutured in place and sterile dressing applied.  Complications/Tolerance None; patient tolerated the procedure well. Chest X-ray is ordered to verify placement for internal jugular central line, and is pending.    EBL Minimal  Specimen(s) None   Eliseo Gum MSN, AGACNP-BC [place] for pager  07/31/2021, 3:34 PM

## 2021-07-31 NOTE — Progress Notes (Signed)
   07/30/21 2129  Provider Notification  Provider Name/Title Gershon Cull, NP  Date Provider Notified 07/30/21  Time Provider Notified 2129  Notification Type Page  Notification Reason Critical result  Test performed and critical result Troponin- 13,992  Date Critical Result Received 07/30/21  Time Critical Result Received 2120  Provider response No new orders (troponin of 16,254 @ 1600 07/30/21)  Date of Provider Response 07/30/21  Time of Provider Response 2132

## 2021-07-31 NOTE — Plan of Care (Signed)
  Problem: Clinical Measurements: Goal: Will remain free from infection Outcome: Progressing Goal: Diagnostic test results will improve Outcome: Progressing Goal: Respiratory complications will improve Outcome: Progressing   Problem: Nutrition: Goal: Adequate nutrition will be maintained Outcome: Progressing   Problem: Coping: Goal: Level of anxiety will decrease Outcome: Progressing   Problem: Safety: Goal: Ability to remain free from injury will improve Outcome: Progressing

## 2021-07-31 NOTE — CV Procedure (Signed)
Procedure: TEE  Indication: Mitral regurgitation  Sedation: Per CCM  Findings: Please see echo section of chart for report.  There is severe, eccentric posteriorly-directed mitral regurgitation.   No complications.   [name] 07/31/2021

## 2021-07-31 NOTE — Procedures (Signed)
Intubation Procedure Note  Deretha Azad  [id number]  1941-01-14  Date:07/31/21  Time:2:56 PM   Provider Performing:Maha Fischel Naomie Dean    Procedure: Intubation ([id number])  Indication(s) Respiratory Failure  Consent Risks of the procedure as well as the alternatives and risks of each were explained to the patient and/or caregiver.  Consent for the procedure was obtained and is signed in the bedside chart   Anesthesia Versed, Fentanyl, and Rocuronium   Time Out Verified patient identification, verified procedure, site/side was marked, verified correct patient position, special equipment/implants available, medications/allergies/relevant history reviewed, required imaging and test results available.   Sterile Technique Usual hand hygeine, masks, and gloves were used   Procedure Description Patient positioned in bed supine.  Sedation given as noted above.  Patient was intubated with endotracheal tube using Glidescope.  View was Grade 1 full glottis .  Number of attempts was 1.  Colorimetric CO2 detector was consistent with tracheal placement.   Complications/Tolerance None; patient tolerated the procedure well. Chest X-ray is ordered to verify placement.   EBL 0   Specimen(s) None  Julian Hy, DO 07/31/21 2:56 PM [facility]

## 2021-07-31 NOTE — Progress Notes (Signed)
Patient transferred to stepdown unit. Report given to stepdown RN prior to transfer.

## 2021-07-31 NOTE — Consult Note (Addendum)
NAME:  [name], MRN:  [id number], DOB:  12-31-1940, LOS: 1 ADMISSION DATE:  07/30/2021, CONSULTATION DATE:  07/31/21 REFERRING MD:  Dr. Beryl Meager, CHIEF COMPLAINT:  Weakness   History of Present Illness:  80 y/o F who presented to [facility] on 10/19 with reports of weakness.   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  She reported on presentation that she had been feeling increasingly weak over the preceding four days.  She went to get a flu shot two days ago and seemed somewhat altered per family.  The weakness progressed to the point she could not sit or stand on her own which is a marked change from her baseline.  In the ER she reported shortness of breath & vague abdominal pain prior to presentation.  Initial ER evaluation notable for SBP 80-100's and new O2 need of 2-6L.  She was transitioned to BiPAP support after IVF.  Labs showed Na 133, Bicarb 18, BUN 62, Cr 4.43 (baseline 0.7), AST 112, WBC 20.4, troponin of 16, 652, lactic acid 2.62.  She was negative for COVID and influenza.  CXR showed small left pleural effusion, mild atelectasis on right & bibasilar atelectasis.  EKG showed ST changes thought to be non-specific.  She was admitted per [facility] for further work up with working diagnosis of possible PNA / infectious etiology of weakness.  She had improvement in BP and lactate with IVF + abx.  Subsequent ECHO showed an LVEF of 60-65%, LV with regional wall motion abnormalities with mid inferolateral hypokinesis, grade II diastolic dysfunction, RVSP of 63.7, LA severely dilated, RA mildly dilated, question of flail mitral valve.  ECHO findings concerning for MI with papillary muscle rupture and severe MR.  She remained on BiPAP for shortness of breath.     PCCM consulted for pulmonary evaluation.   Pertinent  Medical History  Degenerative Disease  Normal Pressure Hydrocephalus s/p Stent Neuropathy  Vertigo  HLD Depression  Former Smoker - smoked total 20 years RA  - on leflunomide, prednisone  Retired Therapist, sports   Significant Hospital Events: Including procedures, antibiotic start and stop dates in addition to other pertinent events   10/19 Admit with weakness, SOB, elevated troponin  10/20 PCCM consulted, ECHO with possible findings of MI with papillary muscle rupture, severe MR. Tx to [facility] for TTE.  May need intubation for procedure.   Interim History / Subjective:  Tmax 99.7 Pt denies chest pain, pain in neck/shoulders/back/ stomach Reports shortness of breath is improved on bipap   Objective   Blood pressure 124/66, pulse 92, temperature 99.7 F (37.6 C), temperature source Axillary, resp. rate (!) 26, height '5\' 7"'$  (1.702 m), weight 77.7 kg, SpO2 96 %.    FiO2 (%):  [40 %] 40 %   Intake/Output Summary (Last 24 hours) at 07/31/2021 1131 Last data filed at 07/31/2021 1107 Gross per 24 hour  Intake 2122.08 ml  Output 150 ml  Net 1972.08 ml   Filed Weights   07/31/21 0100 07/31/21 1048  Weight: 75.8 kg 77.7 kg    Examination: General: pleasant adult female lying in bed on BiPAP in NAD, family at bedside  HENT: MM pink/dry, bipap mask in place, anicteric Lungs: non-labored on bipap, diminished breath sounds bilaterally, occasional wheeze, decreased bases Cardiovascular: S1S2 regular, distant tones Abdomen: soft/non-tender, bsx4 active  Extremities: warm/dry, no peripheral edema  Neuro: AAOx4, speech clear, MAE, normal strength     Resolved Hospital Problem list  Assessment & Plan:   Suspected evolving Acute MI with Papillary Muscle Rupture, Severe MR Acute Heart Failure  Wall motion abnormalities on ECHO, progression to severe MR findings worrisome for papillary muscle rupture.  Baseline MV disease but progression noted on ECHO.  -transfer to [facility] for cardiac evaluation  -likely will need TEE -follow troponin trend, BNP  -appreciate Cardiology evaluation and assistance with patient care  Acute Hypoxic Respiratory Failure  Left  Pleural Effusion with Atelectasis  In setting of edema, atelectasis. Doubt PNA.  -BiPAP support for to ease respiratory burden  -wean O2 for sats >90% -may need to be intubated for TEE  -assess PCT, doubt infectious process. More likely related to acute cardiac issues. If negative, stop rocephin.   AKI Baseline renal function normal.  Suspect pre-renal.   -Trend BMP / urinary output -Replace electrolytes as indicated -Avoid nephrotoxic agents, ensure adequate renal perfusion  Elevated LFT's  Likely congestive hepatopathy.  -follow trend   Leukocytosis  Immune Suppressed  Doubt infectious etiology, more likely stress response from MI.  Note immune suppression with RA therapy.  -follow trend -plan to de-escalate abx pending PCT review -follow blood cultures to maturity   Hx HTN -hold home agents with soft normal BP's  Rheumatoid Arthritis  -continue prednisone -hold leflunomide  -if worsening of LFT's, would hold leflunomide  Normal Pressure Hydrocephalus  Hx shunt. CT head with shunt in place, slight enlargement of ventricles.  CT Abd/Pelvis with small amt free fluid in abdomen.  -follow exam, no acute interventions   Best Practice (right click and "Reselect all SmartList Selections" daily)  Diet/type: NPO DVT prophylaxis: prophylactic heparin  GI prophylaxis: N/A Lines: N/A Foley:  N/A Code Status:  DNR/DNI.  Patient accepting of short term intubation if needed for procedure.  Last date of multidisciplinary goals of care discussion: family updated 10/20 at bedside on plan of care.   Labs   CBC: Recent Labs  Lab 07/30/21 1351 07/31/21 0453  WBC 20.4* 17.9*  NEUTROABS 12.8*  --   HGB 12.2 10.9*  HCT 35.7* 33.7*  MCV 93.0 96.6  PLT 212 0000000    Basic Metabolic Panel: Recent Labs  Lab 07/30/21 1351 07/31/21 0453  NA 133* 135  K 4.4 4.2  CL 102 104  CO2 18* 18*  GLUCOSE 136* 133*  BUN 62* 60*  CREATININE 4.43* 3.81*  CALCIUM 9.5 8.8*   GFR: Estimated  Creatinine Clearance: 12.4 mL/min (A) (by C-G formula based on SCr of 3.81 mg/dL (H)). Recent Labs  Lab 07/30/21 1351 07/30/21 1550 07/31/21 0453  WBC 20.4*  --  17.9*  LATICACIDVEN 2.6* 2.0*  --     Liver Function Tests: Recent Labs  Lab 07/30/21 1351 07/31/21 0453  AST 112* 79*  ALT 31 30  ALKPHOS 58 51  BILITOT 1.0 1.0  PROT 6.9 6.3*  ALBUMIN 4.2 3.6   No results for input(s): LIPASE, AMYLASE in the last 168 hours. No results for input(s): AMMONIA in the last 168 hours.  ABG No results found for: PHART, PCO2ART, PO2ART, HCO3, TCO2, ACIDBASEDEF, O2SAT   Coagulation Profile: Recent Labs  Lab 07/30/21 1351  INR 1.0    Cardiac Enzymes: No results for input(s): CKTOTAL, CKMB, CKMBINDEX, TROPONINI in the last 168 hours.  HbA1C: No results found for: HGBA1C  CBG: No results for input(s): GLUCAP in the last 168 hours.  Review of Systems: Positives in [place]  Gen: Denies fever, chills, weight change, fatigue, night sweats, weakness HEENT: Denies blurred vision, double  vision, hearing loss, tinnitus, sinus congestion, rhinorrhea, sore throat, neck stiffness, dysphagia PULM: Denies shortness of breath, cough, sputum production, hemoptysis, wheezing CV: Denies chest pain, edema, orthopnea, paroxysmal nocturnal dyspnea, palpitations GI: Denies abdominal pain, nausea, vomiting, diarrhea, hematochezia, melena, constipation, change in bowel habits GU: Denies dysuria, hematuria, polyuria, oliguria, urethral discharge Endocrine: Denies hot or cold intolerance, polyuria, polyphagia or appetite change Derm: Denies rash, dry skin, scaling or peeling skin change Heme: Denies easy bruising, bleeding, bleeding gums Neuro: Denies headache, numbness, weakness, slurred speech, loss of memory or consciousness. Mild confusion.   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), Rheumatoid arthritis(714.0), and Vertigo (02/19/2015).   Surgical History:   Past Surgical History:  Procedure Laterality Date   CERVICAL SPINE SURGERY     JOINT REPLACEMENT     LUMBAR SPINE SURGERY     OVARY SURGERY     REPLACEMENT TOTAL KNEE Left    TOTAL KNEE ARTHROPLASTY Right 04/29/2020   Procedure: TOTAL KNEE ARTHROPLASTY;  Surgeon: [name], MD;  Location: [facility] ORS;  Service: Orthopedics;  Laterality: Right;  38mn   VENTRICULO-PERITONEAL SHUNT PLACEMENT / LAPAROSCOPIC INSERTION PERITONEAL CATHETER       Social History:   reports that she has quit smoking. She has never used smokeless tobacco. She reports that she does not currently use alcohol. She reports that she does not use drugs.   Family History:  Her family history includes Brain cancer in her sister; Heart disease in her father; Ovarian cancer in her mother.   Allergies Allergies  Allergen Reactions   Methotrexate Derivatives Other (See Comments)    Dangerously decreased platelet count   Penicillins Itching    Tolerated Cephalosporin Date: 04/30/20.     Sulfa Antibiotics Itching     Home Medications  Prior to Admission medications   Medication Sig Start Date End Date Taking? Authorizing Provider  acetaminophen (TYLENOL) 500 MG tablet Take 1,000 mg by mouth 3 (three) times daily as needed for moderate pain.   Yes [provider]  acetaminophen-codeine (TYLENOL #3) 300-30 MG tablet Take 1 tablet by mouth every 8 (eight) hours as needed for moderate pain.   Yes [provider]  alendronate (FOSAMAX) 70 MG tablet Take 70 mg by mouth once a week. 01/12/20  Yes [provider]  amLODipine (NORVASC) 10 MG tablet Take 10 mg by mouth daily. 06/27/21  Yes [provider]  labetalol (NORMODYNE) 100 MG tablet Take 100 mg by mouth 2  (two) times daily.    Yes [provider]  latanoprost (XALATAN) 0.005 % ophthalmic solution Place 1 drop into both eyes at bedtime.  08/02/17  Yes [provider]  leflunomide (ARAVA) 10 MG tablet Take 10 mg by mouth daily.   Yes [provider]  meclizine (ANTIVERT) 25 MG tablet Take 1 tablet (25 mg total) by mouth 3 (three) times daily as needed for dizziness. 02/18/15  Yes RQuintella Reichert MD  Multiple Vitamin (MULTIVITAMIN) tablet Take 1 tablet by mouth daily.   Yes [provider]  ondansetron (ZOFRAN) 4 MG tablet Take 1 tablet (4 mg total) by mouth every 6 (six) hours. Patient taking differently: Take 4 mg by mouth every 8 (eight) hours as needed for nausea or vomiting. 02/18/15  Yes RQuintella Reichert MD  predniSONE (DELTASONE) 1 MG tablet  Take 2 mg by mouth daily.   Yes [provider]  traZODone (DESYREL) 50 MG tablet Take 1 tablet (50 mg total) by mouth at bedtime. 11/07/20  Yes Kathrynn Ducking, MD     Critical care time: 23 minutes     Noe Gens, MSN, APRN, NP-C, AGACNP-BC [facility] 07/31/2021, 12:12 PM   Please see [email or web address] for pager details.   From 7A-7P if no response, please call [phone] After hours, please call ELink [phone]

## 2021-07-31 NOTE — Progress Notes (Signed)
Pt placed on vent settings per discussion w/ Dr Carlis Abbott.

## 2021-07-31 NOTE — Plan of Care (Signed)

## 2021-07-31 NOTE — Progress Notes (Signed)
Called to room to assess BiPAP alarm. Found port on mask open causing a leak that resulted in alarm. Port closed and alarm no longer sounding. PT states she is a little short of breath- RT left PT on BiPAP at this time. RN aware.

## 2021-07-31 NOTE — Progress Notes (Addendum)
Pharmacy Antibiotic Note  [name] is a 80 y.o. female admitted on 07/30/2021 with shock and renal failure. Pharmacy has been consulted for vancomycin and cefepime dosing. Cr >3, baseline <1, minimal UOP.  Plan: Cefepime 2g IV q24h Vancomycin '1500mg'$  IV x1 Watch renal function, will need VR prior to redosing Discontinue ceftriaxone/azithromycin   Height: '5\' 7"'$  (170.2 cm) Weight: 77.7 kg (171 lb 4.8 oz) IBW/kg (Calculated) : 61.6  Temp (24hrs), Avg:98.2 F (36.8 C), Min:97.6 F (36.4 C), Max:99.7 F (37.6 C)  Recent Labs  Lab 07/30/21 1351 07/30/21 1550 07/31/21 0453  WBC 20.4*  --  17.9*  CREATININE 4.43*  --  3.81*  LATICACIDVEN 2.6* 2.0*  --     Estimated Creatinine Clearance: 12.4 mL/min (A) (by C-G formula based on SCr of 3.81 mg/dL (H)).    Allergies  Allergen Reactions   Methotrexate Derivatives Other (See Comments)    Dangerously decreased platelet count   Penicillins Itching    Tolerated Cephalosporin Date: 04/30/20.     Sulfa Antibiotics Itching    Antimicrobials this admission: Ceftriaxone 10/19 x1 Azithromycin 10/19 x1 Vancomycin 10/20 >> Cefepime 10/20 >>   Microbiology results: 10/19 BCx: NGTD 10/19 UCx: pending   10/19 MRSA PCR: negative  Thank you for allowing pharmacy to be a part of this patient's care.  Arrie Senate, PharmD, BCPS, [facility] Clinical Pharmacist [phone] Please check AMION for all [place] numbers 07/31/2021

## 2021-07-31 NOTE — Progress Notes (Addendum)
Cardiology Consultation:   Patient ID: [name] MRN: [id number]; DOB: 08-04-1941  Admit date: 07/30/2021 Date of Consult: 07/31/2021  PCP:  Margaret Glazier, MD   [name] Cardiologist:  New - Dr. Acie [name]    Patient Profile:   [name] is a 80 y.o. female with a hx of normal pressure hydrocephalus s/p stent placement, vertigo, hyperlipidemia and depression who is being seen 07/31/2021 for the evaluation of normal pressure hydrocephalus s/p stent placement, vertigo, hyperlipidemia and depression at the request of Dr. Bonner [name].  History of Present Illness:   Ms. [name] is a 80 year old female with past medical history of normal pressure hydrocephalus s/p stent placement, vertigo, hyperlipidemia and depression.  She has never seen by cardiology service in the past and not known to have cardiac issues.  Remote transesophageal echo obtained during hospitalization for sepsis in February 2010 showed EF 65%, trivial AI, no vegetation noted.  Patient was admitted on 07/30/2021 with 3-day onset of worsening weakness and shortness of breath.  She went to get her flu shot and appeared to be altered at the location.  She was subsequently sent to [facility].  On initial arrival, systolic blood pressure was in the 80s to 100 range.  Lab work showed sodium level 133, bicarb 18, BUN 62, creatinine severely elevated to 4.43 was her baseline creatinine was 0.7.  CBC shows white blood cell count 20.4.  Troponin was elevated at 16,600 --> 16,254 --> 13,992 --> 14,937.  TSH and a free T4 normal.  Lactic acid elevated at 2.62.  Flu and COVID test negative.  Urinalysis negative.  Chest x-ray showed a left pleural effusion and a lactic ectasis.  CT of the head shows stable stent in the brain with slight increase in ventricular enlargement. .  CT of abdomen shows small amount of free fluid in the pelvis, VP shunt catheter in place, colonic diverticulitis, small bilateral  pleural effusion, aortic atherosclerosis. There is concerned that she may be having pneumonia and was treated with antibiotic therapy with IV fluid.  She has been placed on BiPAP therapy for acute respiratory failure.  Cardiology service consulted for elevated troponin.  Talking with the patient, she denies any recent chest pain.  Her baseline functional ability is quite good and has been able to do everything at home.  After overnight hydration, her creatinine improved to 3.81.  White blood cell count 17.9.  Systolic blood pressure borderline low this morning in the low 100 range.  Repeat chest x-ray obtained on 07/31/2021 showed increasing left lung base opacity compatible with progressive infection.  Cardiology service has been consulted for elevated troponin.   Past Medical History:  Diagnosis Date   Abnormality of gait 04/17/2013   Cervical spondylosis    Cervicogenic headache    Right occipital neuralgia   Depression    Dyslipidemia    Gait disorder    Hyperlipidemia    Hypertension    Normal pressure hydrocephalus (HCC)    Occipital neuralgia 04/17/2013   right   Osteomyelitis of left knee region [facility])    Rheumatoid arthritis(714.0)    Vertigo 02/19/2015    Past Surgical History:  Procedure Laterality Date   CERVICAL SPINE SURGERY     JOINT REPLACEMENT     LUMBAR SPINE SURGERY     OVARY SURGERY     REPLACEMENT TOTAL KNEE Left    TOTAL KNEE ARTHROPLASTY Right 04/29/2020   Procedure: TOTAL KNEE ARTHROPLASTY;  Surgeon: [name], MD;  Location:  [facility];  Service: Orthopedics;  Laterality: Right;  31mn   VENTRICULO-PERITONEAL SHUNT PLACEMENT / LAPAROSCOPIC INSERTION PERITONEAL CATHETER       Home Medications:  Prior to Admission medications   Medication Sig Start Date End Date Taking? Authorizing Provider  acetaminophen (TYLENOL) 500 MG tablet Take 1,000 mg by mouth 3 (three) times daily as needed for moderate pain.   Yes [provider]  acetaminophen-codeine  (TYLENOL #3) 300-30 MG tablet Take 1 tablet by mouth every 8 (eight) hours as needed for moderate pain.   Yes [provider]  alendronate (FOSAMAX) 70 MG tablet Take 70 mg by mouth once a week. 01/12/20  Yes [provider]  amLODipine (NORVASC) 10 MG tablet Take 10 mg by mouth daily. 06/27/21  Yes [provider]  labetalol (NORMODYNE) 100 MG tablet Take 100 mg by mouth 2 (two) times daily.    Yes [provider]  latanoprost (XALATAN) 0.005 % ophthalmic solution Place 1 drop into both eyes at bedtime.  08/02/17  Yes [provider]  leflunomide (ARAVA) 10 MG tablet Take 10 mg by mouth daily.   Yes [provider]  meclizine (ANTIVERT) 25 MG tablet Take 1 tablet (25 mg total) by mouth 3 (three) times daily as needed for dizziness. 02/18/15  Yes RQuintella Reichert MD  Multiple Vitamin (MULTIVITAMIN) tablet Take 1 tablet by mouth daily.   Yes [provider]  ondansetron (ZOFRAN) 4 MG tablet Take 1 tablet (4 mg total) by mouth every 6 (six) hours. Patient taking differently: Take 4 mg by mouth every 8 (eight) hours as needed for nausea or vomiting. 02/18/15  Yes RQuintella Reichert MD  predniSONE (DELTASONE) 1 MG tablet Take 2 mg by mouth daily.   Yes [provider]  traZODone (DESYREL) 50 MG tablet Take 1 tablet (50 mg total) by mouth at bedtime. 11/07/20  Yes WKathrynn Ducking MD    Inpatient Medications: Scheduled Meds:  albuterol  2.5 mg Nebulization Q6H   heparin  5,000 Units Subcutaneous Q8H   multivitamin with minerals  1 tablet Oral Daily   predniSONE  2 mg Oral Daily   sodium chloride flush  3 mL Intravenous Q12H   Continuous Infusions:  azithromycin     cefTRIAXone (ROCEPHIN)  IV     PRN Meds: acetaminophen **OR** acetaminophen, polyethylene glycol  Allergies:    Allergies  Allergen Reactions   Methotrexate Derivatives Other (See Comments)    Dangerously decreased platelet count   Penicillins Itching     Tolerated Cephalosporin Date: 04/30/20.     Sulfa Antibiotics Itching    Social History:   Social History   Socioeconomic History   Marital status: Married    Spouse name: Not on file   Number of children: 2   Years of education: 16   Highest education level: Not on file  Occupational History   Occupation: Retired    [employer] farm manager RETIRED  Tobacco Use   Smoking status: Former   Smokeless tobacco: Never  VScientific laboratory technicianUse: Never used  Substance and Sexual Activity   Alcohol use: Not Currently   Drug use: No   Sexual activity: Not on file  Other Topics Concern   Not on file  Social History Narrative   Patient lives at home with her husband.    Patient has 2 children.    Patient has 12 + years of school.    Patient is left handed.   Patient drinks about 2 cups of  caffeine daily.   Social Determinants of Health   Financial Resource Strain: Not on file  Food Insecurity: Not on file  Transportation Needs: Not on file  Physical Activity: Not on file  Stress: Not on file  Social Connections: Not on file  Intimate Partner Violence: Not on file    Family History:    Family History  Problem Relation Age of Onset   Ovarian cancer Mother    Heart disease Father    Brain cancer Sister      ROS:  Please see the history of present illness.   All other ROS reviewed and negative.     Physical Exam/Data:   Vitals:   07/31/21 0249 07/31/21 0628 07/31/21 0745 07/31/21 0914  BP:  108/61    Pulse:  95 (!) 103   Resp: 19 18 (!) 27   Temp:  97.8 F (36.6 C)    TempSrc:      SpO2:  96% 94% 95%  Weight:      Height:        Intake/Output Summary (Last 24 hours) at 07/31/2021 0935 Last data filed at 07/31/2021 0600 Gross per 24 hour  Intake 1888.77 ml  Output 150 ml  Net 1738.77 ml   Last 3 Weights 07/31/2021 07/09/2021 12/23/2020  Weight (lbs) 167 lb 1.7 oz 162 lb 150 lb  Weight (kg) 75.8 kg 73.483 kg 68.04 kg     Body mass index is 26.17 kg/m.  General:   mildly distressed, appears to be dyspneic with talking.  HEENT: normal Neck: no JVD Vascular: No carotid bruits; Distal pulses 2+ bilaterally Cardiac:  normal S1, S2; RRR; no murmur  Lungs:  clear to auscultation bilaterally, no rhonchi or rales. Bilateral wheezing.  Abd: soft, nontender, no hepatomegaly  Ext: no edema Musculoskeletal:  No deformities, BUE and BLE strength normal and equal Skin: warm and dry  Neuro:  CNs 2-12 intact, no focal abnormalities noted Psych:  Normal affect   EKG:  The EKG was personally reviewed and demonstrates: Normal sinus rhythm, poor R wave progression in the anterior leads.  Unchanged when compared to the previous EKG from July 2021. Telemetry:  Telemetry was personally reviewed and demonstrates: Sinus rhythm with occasional PVCs.  Relevant CV Studies:  TEE 12/06/2008  SUMMARY   -  Left ventricular ejection fraction was estimated to be 65 %.         There were no left ventricular regional wall motion         abnormalities.   -  There was trivial aortic valvular regurgitation.   -  There was mild atheroma of the descending aorta.   -  There was mild mitral valvular regurgitation.   -  There was no left atrial appendage thrombus identified.   -  Normal pulmonic valve   -  Normal tricuspid valve    IMPRESSIONS   -  There was no diagnostic echocardiographic evidence for valvular         vegetation.    Laboratory Data:  High Sensitivity Troponin:   Recent Labs  Lab 07/30/21 1351 07/30/21 1624 07/30/21 1945 07/30/21 2119  TROPONINIHS 16,652* 16,254* 13,992* 14,937*     Chemistry Recent Labs  Lab 07/30/21 1351 07/31/21 0453  NA 133* 135  K 4.4 4.2  CL 102 104  CO2 18* 18*  GLUCOSE 136* 133*  BUN 62* 60*  CREATININE 4.43* 3.81*  CALCIUM 9.5 8.8*  GFRNONAA 9* 11*  ANIONGAP 13 13    Recent Labs  Lab 07/30/21 1351 07/31/21 0453  PROT 6.9 6.3*  ALBUMIN 4.2 3.6  AST 112* 79*  ALT 31 30  ALKPHOS 58 51  BILITOT 1.0 1.0   Lipids  No results for input(s): CHOL, TRIG, HDL, LABVLDL, LDLCALC, CHOLHDL in the last 168 hours.  Hematology Recent Labs  Lab 07/30/21 1351 07/31/21 0453  WBC 20.4* 17.9*  RBC 3.84* 3.49*  HGB 12.2 10.9*  HCT 35.7* 33.7*  MCV 93.0 96.6  MCH 31.8 31.2  MCHC 34.2 32.3  RDW 13.2 13.2  PLT 212 153   Thyroid  Recent Labs  Lab 07/30/21 1450  TSH 1.050  FREET4 0.90    BNP Recent Labs  Lab 07/30/21 1945  BNP 2,001.3*    DDimer No results for input(s): DDIMER in the last 168 hours.   Radiology/Studies:  CT ABDOMEN PELVIS WO CONTRAST  Result Date: 07/30/2021 CLINICAL DATA:  Acute abdominal pain. EXAM: CT ABDOMEN AND PELVIS WITHOUT CONTRAST TECHNIQUE: Multidetector CT imaging of the abdomen and pelvis was performed following the standard protocol without IV contrast. COMPARISON:  None. FINDINGS: Lower chest: There are small bilateral pleural effusions. There is some patchy airspace disease in the left lower lobe and atelectasis in the right lower lobe. Hepatobiliary: No focal liver abnormality is seen. No gallstones, gallbladder wall thickening, or biliary dilatation. Pancreas: Unremarkable. No pancreatic ductal dilatation or surrounding inflammatory changes. Spleen: Normal in size without focal abnormality. Adrenals/Urinary Tract: There are small left peripelvic cysts. The kidneys, adrenal glands, ureters and bladder are otherwise within normal limits. Stomach/Bowel: Stomach is within normal limits. Appendix appears normal. No evidence of bowel wall thickening, distention, or inflammatory changes. There is sigmoid colon diverticulosis without evidence for acute diverticulitis. Vascular/Lymphatic: Aortic atherosclerosis. No enlarged abdominal or pelvic lymph nodes. Reproductive: Uterus and bilateral adnexa are unremarkable. Other: There is a small amount of free fluid in the pelvis. VP shunt catheter is present entering the right abdomen with distal catheter tip located in the right pelvis. The  visualized catheter appears intact. There is a small fat containing umbilical hernia. Musculoskeletal: Multilevel degenerative changes affect the spine. There is mild chronic compression deformity of the superior endplate of 624THL. There is a healed right inferior pubic ramus fracture. There are severe degenerative changes of both hips. No acute fractures are identified. IMPRESSION: 1. Small amount of free fluid in the pelvis. 2. 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 3. Colonic diverticulosis without evidence for diverticulitis. 4. Small bilateral pleural effusions with left lower lobe airspace disease worrisome for infection. 5.  Aortic Atherosclerosis (ICD10-I70.0). Electronically Signed   By: Ronney Asters M.D.   On: 07/30/2021 15:36   CT Head Wo Contrast  Result Date: 07/30/2021 CLINICAL DATA:  Delirium. Bilateral leg weakness. Altered mental status. Shunt. EXAM: CT HEAD WITHOUT CONTRAST TECHNIQUE: Contiguous axial images were obtained from the base of the skull through the vertex without intravenous contrast. COMPARISON:  11/09/2018 FINDINGS: Brain: Right frontal ventricular shunt catheter in the right lateral ventricle unchanged. Moderate ventricular enlargement most notably of the third and lateral ventricles. Lateral ventricle size slightly larger. Biventricular diameter now 74.5 mm compared with 73 mm previously. Mild periventricular white matter hypodensity. Negative for acute infarct, hemorrhage, mass Vascular: Negative for hyperdense vessel Skull: No acute abnormality Sinuses/Orbits: Paranasal sinuses clear. Bilateral cataract extraction Other: None IMPRESSION: Right frontal shunt catheter unchanged. Slight progression of ventricle enlargement. No acute abnormality. Electronically Signed   By: Franchot Gallo M.D.   On: 07/30/2021 14:18   DG Chest [facility] [address]  Result Date: 07/30/2021 CLINICAL DATA:  Possible sepsis.  Leg weakness. EXAM: PORTABLE CHEST 1 VIEW COMPARISON:  07/20/2017. FINDINGS: Trachea is  midline. Heart size stable. Thoracic aorta is calcified. Shunt catheter projects over the midline chest. Biapical pleural thickening. Bibasilar subsegmental volume loss. Probable small left pleural effusion. IMPRESSION: 1. Probable small left pleural effusion. 2. Bibasilar atelectasis. Electronically Signed   By: Lorin Picket M.D.   On: 07/30/2021 14:04     Assessment and Plan:   Elevated troponin:   -Hs 16,600 --> 16,254 --> 13,992 --> 14,937  -Patient denies any chest pain.  Echocardiogram is currently pending.  -Suspect severe elevation of the troponin is demand ischemia in the setting of AKI, acute respiratory failure, leukocytosis, and elevated lactic acid  -Patient is currently being transferred to ICU and have pulmonology service involved.  -We will follow-up on the result of the echocardiogram.  Given the AKI, she is not a candidate for any invasive cardiac therapy.  As long as prior to ejection fraction is normal, then this is likely demand ischemia.  If EF is low, then we will consider additional work-up  Acute respiratory failure/possible pneumonia  -BNP 2001.  However patient does not appears to be volume overloaded.  On physical exam, she has quite a bit of wheezing.  She required BiPAP therapy.  We think this is primarily pulmonary issue.  Patient is being transferred to ICU and have pulmonology service involved. -Chest x-ray showed increasing left lung base opacity compatible with infection.  Leukocytosis: Arrived with white blood cell count of 20, improved to 17 this morning  Normal pressure hydrocephalus s/p stent   Risk Assessment/Risk Scores:          For questions or updates, please contact [place] Please consult [email or web address] for contact info under    Signed, Almyra Deforest, [place]  07/31/2021 9:35 AM  Attending Note:   The patient was seen and examined.  Agree with assessment and plan as noted above.  Changes made to the above note as needed.  Patient seen  and independently examined with Almyra Deforest, PA .   We discussed all aspects of the encounter. I agree with the assessment and plan as stated above.    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 She has evidence of an inf. Lat wall motion defect .   No hx of cath previously   She is a DNR.  I've had significant discussion with family, patient, her son , Lennette Bihari , who is one of our ER docs.  I think the best course of action is to transfer to [facility]range for TEE - she will need intubation for this  Consider mitra clip if anatomy is favorable    I have spent a total of 40 minutes with patient reviewing hospital  notes , telemetry, EKGs, labs and examining patient as well as establishing an assessment and plan that was discussed with the patient.  > 50% of time was spent in direct patient care.    Thayer Headings, Brooke Bonito., MD, [facility] 08/01/2021, 11:22 AM [address],  [place] Pager [phone]

## 2021-07-31 NOTE — Consult Note (Addendum)
Advanced Heart Failure Team Consult Note   Primary Physician: Javier Glazier, MD PCP-Cardiologist:  None  Reason for Consultation: Severe MR   HPI:    [name] is seen today for evaluation of severe MR at the request of Dr. Acie Fredrickson, Cardiology.   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 PMH h/o normal pressure hydrocephalus s/p stent, HDL, RA and former smoker.   Initially presented to [facility] on 10/19 w/ complaint of weakness, malaise and slightly AMS 2 days post Flu vaccine.  In ED, she was found to be SOB requiring supp O2 and hypotensive w/ SBPs in the 80s. Became increasingly SOB and hypoxic and transitioned to BiPAP. Labs c/w shock. Bicab 18, lactic acid 2.62, SCr 4.43 (baseline <1.0), AST 112. WBC 20K. Initial trop [id number]. EKG NSR w/ poor R wave progression in anterior leads. COVID and Flu negative. CXR w/ small lt pleural effusion + bibasilar atelectasis. Head CT w/ stable rt frontal shunt catheter, unchanged from prior study. Slight progression of ventricle enlargement. No acute abnormality.  Hs trop 16,652>>16,254>>13,992>>14,937   2D Echo w/ normal LVEF 60-65%, RV normal. 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 MR is  highly eccentric and posteriorly directed. LA severely dilated. + mid inferolateral hypokinesis  Blood cx NGTD UA +  UCx pending  On Rocephin + Azithromycin.   Transferred to  [facility] for further w/u and management of severe MR. BP is currently stable w/o support.   Echo 07/31/21 1. Left ventricular ejection fraction, by estimation, is 60 to 65%. The  left ventricle has normal function. The left ventricle demonstrates  regional wall motion abnormalities with mid inferolateral hypokinesis.  Left ventricular diastolic parameters are  consistent with Grade II diastolic dysfunction (pseudonormalization).   2. Right ventricular systolic function is normal. The right ventricular  size is normal. There is severely elevated  pulmonary artery systolic  pressure. The estimated right ventricular systolic pressure is [id number] mmHg.   3. Left atrial size was severely dilated.   4. Right atrial size was mildly dilated.   5. The mitral valve is abnormal. Severe mitral valve regurgitation. MR is  highly eccentric and posteriorly directed. In the parasternal long axis  view, I question possible A2 partial flail. There is splay artifact noted.  PISA ERO 0.39 cm^2 (not likely  to be completely accurate as off axis). No evidence of mitral stenosis.   6. The aortic valve is tricuspid. Aortic valve regurgitation is not  visualized. No aortic stenosis is present.   7. The inferior vena cava is dilated in size with <50% respiratory  variability, suggesting right atrial pressure of 15 mmHg.   Review of Systems: [y] = yes, '[ ]'$  = no   General: Weight gain '[ ]'$ ; Weight loss '[ ]'$ ; Anorexia '[ ]'$ ; Fatigue '[ ]'$ ; Fever '[ ]'$ ; Chills '[ ]'$ ; WeaknessY]  Cardiac: Chest pain/pressure '[ ]'$ ; Resting SOB '[ ]'$ ; Exertional SOB [Y ]; Orthopnea '[ ]'$ ; Pedal Edema '[ ]'$ ; Palpitations '[ ]'$ ; Syncope '[ ]'$ ; Presyncope '[ ]'$ ; Paroxysmal nocturnal dyspnea'[ ]'$   Pulmonary: Cough '[ ]'$ ; Wheezing'[ ]'$ ; Hemoptysis'[ ]'$ ; Sputum '[ ]'$ ; Snoring '[ ]'$   GI: Vomiting'[ ]'$ ; Dysphagia'[ ]'$ ; Melena'[ ]'$ ; Hematochezia '[ ]'$ ; Heartburn'[ ]'$ ; Abdominal pain '[ ]'$ ; Constipation '[ ]'$ ; Diarrhea '[ ]'$ ; BRBPR '[ ]'$   GU: Hematuria'[ ]'$ ; Dysuria '[ ]'$ ; Nocturia'[ ]'$   Vascular: Pain in legs with walking '[ ]'$ ; Pain in feet with lying flat '[ ]'$ ; Non-healing sores '[ ]'$ ;  Stroke '[ ]'$ ; TIA '[ ]'$ ; Slurred speech '[ ]'$ ;  Neuro: Headaches'[ ]'$ ; Vertigo'[ ]'$ ; Seizures'[ ]'$ ; Paresthesias'[ ]'$ ;Blurred vision '[ ]'$ ; Diplopia '[ ]'$ ; Vision changes '[ ]'$   Ortho/Skin: Arthritis '[ ]'$ ; Joint pain '[ ]'$ ; Muscle pain '[ ]'$ ; Joint swelling '[ ]'$ ; Back Pain '[ ]'$ ; Rash '[ ]'$   Psych: Depression'[ ]'$ ; Anxiety'[ ]'$   Heme: Bleeding problems '[ ]'$ ; Clotting disorders '[ ]'$ ; Anemia '[ ]'$   Endocrine: Diabetes '[ ]'$ ; Thyroid dysfunction'[ ]'$   Home Medications Prior to Admission medications   Medication  Sig Start Date End Date Taking? Authorizing Provider  acetaminophen (TYLENOL) 500 MG tablet Take 1,000 mg by mouth 3 (three) times daily as needed for moderate pain.   Yes [provider]  acetaminophen-codeine (TYLENOL #3) 300-30 MG tablet Take 1 tablet by mouth every 8 (eight) hours as needed for moderate pain.   Yes [provider]  alendronate (FOSAMAX) 70 MG tablet Take 70 mg by mouth once a week. 01/12/20  Yes [provider]  amLODipine (NORVASC) 10 MG tablet Take 10 mg by mouth daily. 06/27/21  Yes [provider]  labetalol (NORMODYNE) 100 MG tablet Take 100 mg by mouth 2 (two) times daily.    Yes [provider]  latanoprost (XALATAN) 0.005 % ophthalmic solution Place 1 drop into both eyes at bedtime.  08/02/17  Yes [provider]  leflunomide (ARAVA) 10 MG tablet Take 10 mg by mouth daily.   Yes [provider]  meclizine (ANTIVERT) 25 MG tablet Take 1 tablet (25 mg total) by mouth 3 (three) times daily as needed for dizziness. 02/18/15  Yes Quintella Reichert, MD  Multiple Vitamin (MULTIVITAMIN) tablet Take 1 tablet by mouth daily.   Yes [provider]  ondansetron (ZOFRAN) 4 MG tablet Take 1 tablet (4 mg total) by mouth every 6 (six) hours. Patient taking differently: Take 4 mg by mouth every 8 (eight) hours as needed for nausea or vomiting. 02/18/15  Yes Quintella Reichert, MD  predniSONE (DELTASONE) 1 MG tablet Take 2 mg by mouth daily.   Yes [provider]  traZODone (DESYREL) 50 MG tablet Take 1 tablet (50 mg total) by mouth at bedtime. 11/07/20  Yes Kathrynn Ducking, MD    Past Medical History: Past Medical History:  Diagnosis Date   Abnormality of gait 04/17/2013   Cervical spondylosis    Cervicogenic headache    Right occipital neuralgia   Depression    Dyslipidemia    Gait disorder    Hyperlipidemia    Hypertension    Normal pressure hydrocephalus (HCC)    Occipital neuralgia 04/17/2013   right    Osteomyelitis of left knee region [facility])    Rheumatoid arthritis(714.0)    Vertigo 02/19/2015    Past Surgical History: Past Surgical History:  Procedure Laterality Date   CERVICAL SPINE SURGERY     JOINT REPLACEMENT     LUMBAR SPINE SURGERY     OVARY SURGERY     REPLACEMENT TOTAL KNEE Left    TOTAL KNEE ARTHROPLASTY Right 04/29/2020   Procedure: TOTAL KNEE ARTHROPLASTY;  Surgeon: [name], MD;  Location: [facility];  Service: Orthopedics;  Laterality: Right;  69mn   VENTRICULO-PERITONEAL SHUNT PLACEMENT / LAPAROSCOPIC INSERTION PERITONEAL CATHETER      Family History: Family History  Problem Relation Age of Onset   Ovarian cancer Mother    Heart disease Father    Brain cancer Sister     Social History: Social History   Socioeconomic History  Marital status: Married    Spouse name: Not on file   Number of children: 2   Years of education: 16   Highest education level: Not on file  Occupational History   Occupation: Retired    Fish farm manager: RETIRED  Tobacco Use   Smoking status: Former   Smokeless tobacco: Never  Scientific laboratory technician Use: Never used  Substance and Sexual Activity   Alcohol use: Not Currently   Drug use: No   Sexual activity: Not on file  Other Topics Concern   Not on file  Social History Narrative   Patient lives at home with her husband.    Patient has 2 children.    Patient has 12 + years of school.    Patient is left handed.   Patient drinks about 2 cups of caffeine daily.   Social Determinants of Health   Financial Resource Strain: Not on file  Food Insecurity: Not on file  Transportation Needs: Not on file  Physical Activity: Not on file  Stress: Not on file  Social Connections: Not on file    Allergies:  Allergies  Allergen Reactions   Methotrexate Derivatives Other (See Comments)    Dangerously decreased platelet count   Penicillins Itching    Tolerated Cephalosporin Date: 04/30/20.     Sulfa Antibiotics Itching     Objective:    Vital Signs:   Temp:  [97.6 F (36.4 C)-99.7 F (37.6 C)] 99.7 F (37.6 C) (10/20 1048) Pulse Rate:  [71-103] 94 (10/20 1200) Resp:  [18-30] 21 (10/20 1200) BP: (83-134)/(54-78) 125/67 (10/20 1200) SpO2:  [90 %-97 %] 95 % (10/20 1300) FiO2 (%):  [40 %] 40 % (10/20 1300) Weight:  [75.8 kg-77.7 kg] 77.7 kg (10/20 1048) Last BM Date: 07/30/21  Weight change: Filed Weights   07/31/21 0100 07/31/21 1048  Weight: 75.8 kg 77.7 kg    Intake/Output:   Intake/Output Summary (Last 24 hours) at 07/31/2021 1313 Last data filed at 07/31/2021 1107 Gross per 24 hour  Intake 2122.08 ml  Output 150 ml  Net 1972.08 ml      Physical Exam    General:  elderly AF on BipAP No resp difficulty HEENT: normal Neck: supple. JVD elevated . Carotids 2+ bilat; no bruits. No lymphadenopathy or thyromegaly appreciated. Cor: PMI nondisplaced. Regular rate & rhythm. 3/6 MR  Lungs: course bilaterally  Abdomen: soft, nontender, nondistended. No hepatosplenomegaly. No bruits or masses. Good bowel sounds. Extremities: no cyanosis, clubbing, rash, edema Neuro: alert & orientedx3, cranial nerves grossly intact. moves all 4 extremities w/o difficulty. Affect pleasant   Telemetry   NSR 90 bpm   EKG    NSR 80 bpm  Labs   Basic Metabolic Panel: Recent Labs  Lab 07/30/21 1351 07/31/21 0453  NA 133* 135  K 4.4 4.2  CL 102 104  CO2 18* 18*  GLUCOSE 136* 133*  BUN 62* 60*  CREATININE 4.43* 3.81*  CALCIUM 9.5 8.8*    Liver Function Tests: Recent Labs  Lab 07/30/21 1351 07/31/21 0453  AST 112* 79*  ALT 31 30  ALKPHOS 58 51  BILITOT 1.0 1.0  PROT 6.9 6.3*  ALBUMIN 4.2 3.6   No results for input(s): LIPASE, AMYLASE in the last 168 hours. No results for input(s): AMMONIA in the last 168 hours.  CBC: Recent Labs  Lab 07/30/21 1351 07/31/21 0453  WBC 20.4* 17.9*  NEUTROABS 12.8*  --   HGB 12.2 10.9*  HCT 35.7* 33.7*  MCV 93.0 96.6  PLT 212 153    Cardiac  Enzymes: No results for input(s): CKTOTAL, CKMB, CKMBINDEX, TROPONINI in the last 168 hours.  BNP: BNP (last 3 results) Recent Labs    07/30/21 1945  BNP 2,001.3*    ProBNP (last 3 results) No results for input(s): PROBNP in the last 8760 hours.   CBG: No results for input(s): GLUCAP in the last 168 hours.  Coagulation Studies: Recent Labs    07/30/21 1351  LABPROT 13.4  INR 1.0     Imaging   CT ABDOMEN PELVIS WO CONTRAST  Result Date: 07/30/2021 CLINICAL DATA:  Acute abdominal pain. EXAM: CT ABDOMEN AND PELVIS WITHOUT CONTRAST TECHNIQUE: Multidetector CT imaging of the abdomen and pelvis was performed following the standard protocol without IV contrast. COMPARISON:  None. FINDINGS: Lower chest: There are small bilateral pleural effusions. There is some patchy airspace disease in the left lower lobe and atelectasis in the right lower lobe. Hepatobiliary: No focal liver abnormality is seen. No gallstones, gallbladder wall thickening, or biliary dilatation. Pancreas: Unremarkable. No pancreatic ductal dilatation or surrounding inflammatory changes. Spleen: Normal in size without focal abnormality. Adrenals/Urinary Tract: There are small left peripelvic cysts. The kidneys, adrenal glands, ureters and bladder are otherwise within normal limits. Stomach/Bowel: Stomach is within normal limits. Appendix appears normal. No evidence of bowel wall thickening, distention, or inflammatory changes. There is sigmoid colon diverticulosis without evidence for acute diverticulitis. Vascular/Lymphatic: Aortic atherosclerosis. No enlarged abdominal or pelvic lymph nodes. Reproductive: Uterus and bilateral adnexa are unremarkable. Other: There is a small amount of free fluid in the pelvis. VP shunt catheter is present entering the right abdomen with distal catheter tip located in the right pelvis. The visualized catheter appears intact. There is a small fat containing umbilical hernia. Musculoskeletal:  Multilevel degenerative changes affect the spine. There is mild chronic compression deformity of the superior endplate of 624THL. There is a healed right inferior pubic ramus fracture. There are severe degenerative changes of both hips. No acute fractures are identified. IMPRESSION: 1. Small amount of free fluid in the pelvis. 2. 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 3. Colonic diverticulosis without evidence for diverticulitis. 4. Small bilateral pleural effusions with left lower lobe airspace disease worrisome for infection. 5.  Aortic Atherosclerosis (ICD10-I70.0). Electronically Signed   By: Ronney Asters M.D.   On: 07/30/2021 15:36   CT Head Wo Contrast  Result Date: 07/30/2021 CLINICAL DATA:  Delirium. Bilateral leg weakness. Altered mental status. Shunt. EXAM: CT HEAD WITHOUT CONTRAST TECHNIQUE: Contiguous axial images were obtained from the base of the skull through the vertex without intravenous contrast. COMPARISON:  11/09/2018 FINDINGS: Brain: Right frontal ventricular shunt catheter in the right lateral ventricle unchanged. Moderate ventricular enlargement most notably of the third and lateral ventricles. Lateral ventricle size slightly larger. Biventricular diameter now 74.5 mm compared with 73 mm previously. Mild periventricular white matter hypodensity. Negative for acute infarct, hemorrhage, mass Vascular: Negative for hyperdense vessel Skull: No acute abnormality Sinuses/Orbits: Paranasal sinuses clear. Bilateral cataract extraction Other: None IMPRESSION: Right frontal shunt catheter unchanged. Slight progression of ventricle enlargement. No acute abnormality. Electronically Signed   By: Franchot Gallo M.D.   On: 07/30/2021 14:18   DG CHEST PORT 1 VIEW  Result Date: 07/31/2021 CLINICAL DATA:  80 year old female with respiratory failure. EXAM: PORTABLE CHEST 1 VIEW COMPARISON:  Portable chest 07/30/2021 and earlier. FINDINGS: Portable AP semi upright view at 912 hours. Stable lung volumes and  mediastinal contours. Increasing left lung base opacity, now partially obscuring the left  hemidiaphragm. Right chest shunt catheter redemonstrated. No pneumothorax. No pulmonary edema or definite effusion. Partially visible cervical ACDF. No acute osseous abnormality identified. IMPRESSION: 1. Increasing left lung base opacity since yesterday compatible with progressive atelectasis or infection. 2. No other acute cardiopulmonary abnormality identified. Electronically Signed   By: Genevie Ann M.D.   On: 07/31/2021 09:34   DG Chest Port 1 View  Result Date: 07/30/2021 CLINICAL DATA:  Possible sepsis.  Leg weakness. EXAM: PORTABLE CHEST 1 VIEW COMPARISON:  07/20/2017. FINDINGS: Trachea is midline. Heart size stable. Thoracic aorta is calcified. Shunt catheter projects over the midline chest. Biapical pleural thickening. Bibasilar subsegmental volume loss. Probable small left pleural effusion. IMPRESSION: 1. Probable small left pleural effusion. 2. Bibasilar atelectasis. Electronically Signed   By: Lorin Picket M.D.   On: 07/30/2021 14:04   ECHOCARDIOGRAM COMPLETE  Result Date: 07/31/2021    ECHOCARDIOGRAM REPORT   Patient Name:   HALA IVORY Date of Exam: 07/31/2021 Medical Rec #:  [id number]              Height:       67.0 in Accession #:    [id number]             Weight:       167.1 lb Date of Birth:  [birth year]               BSA:          1.874 m Patient Age:    30 years               BP:           108/61 mmHg Patient Gender: F                      HR:           96 bpm. Exam Location:  Inpatient Procedure: 2D Echo, Cardiac Doppler and Color Doppler Indications:     Chest pain  History:         Patient has no prior history of Echocardiogram examinations.                  Risk Factors:Hypertension.  Sonographer:     Helmut Muster Referring Phys:  [place] Diagnosing Phys: Franki Monte IMPRESSIONS  1. Left ventricular ejection fraction, by estimation, is 60 to 65%. The left ventricle has  normal function. The left ventricle demonstrates regional wall motion abnormalities with mid inferolateral hypokinesis. Left ventricular diastolic parameters are consistent with Grade II diastolic dysfunction (pseudonormalization).  2. Right ventricular systolic function is normal. The right ventricular size is normal. There is severely elevated pulmonary artery systolic pressure. The estimated right ventricular systolic pressure is [id number] mmHg.  3. Left atrial size was severely dilated.  4. Right atrial size was mildly dilated.  5. The mitral valve is abnormal. Severe mitral valve regurgitation. MR is highly eccentric and posteriorly directed. In the parasternal long axis view, I question possible A2 partial flail. There is splay artifact noted. PISA ERO 0.39 cm^2 (not likely to be completely accurate as off axis). No evidence of mitral stenosis.  6. The aortic valve is tricuspid. Aortic valve regurgitation is not visualized. No aortic stenosis is present.  7. The inferior vena cava is dilated in size with <50% respiratory variability, suggesting right atrial pressure of 15 mmHg. FINDINGS  Left Ventricle: Left ventricular ejection fraction, by estimation, is 60 to 65%. The left ventricle has normal function.  The left ventricle demonstrates regional wall motion abnormalities. The left ventricular internal cavity size was normal in size. There is no left ventricular hypertrophy. Left ventricular diastolic parameters are consistent with Grade II diastolic dysfunction (pseudonormalization). Right Ventricle: The right ventricular size is normal. No increase in right ventricular wall thickness. Right ventricular systolic function is normal. There is severely elevated pulmonary artery systolic pressure. The tricuspid regurgitant velocity is 3.49 m/s, and with an assumed right atrial pressure of 15 mmHg, the estimated right ventricular systolic pressure is [id number] mmHg. Left Atrium: Left atrial size was severely dilated. Right  Atrium: Right atrial size was mildly dilated. Pericardium: There is no evidence of pericardial effusion. Mitral Valve: The mitral valve is abnormal. Severe mitral valve regurgitation. No evidence of mitral valve stenosis. Tricuspid Valve: The tricuspid valve is normal in structure. Tricuspid valve regurgitation is mild. Aortic Valve: The aortic valve is tricuspid. Aortic valve regurgitation is not visualized. No aortic stenosis is present. Pulmonic Valve: The pulmonic valve was normal in structure. Pulmonic valve regurgitation is not visualized. Aorta: The aortic root is normal in size and structure. Venous: The inferior vena cava is dilated in size with less than 50% respiratory variability, suggesting right atrial pressure of 15 mmHg. IAS/Shunts: No atrial level shunt detected by color flow Doppler.  LEFT VENTRICLE PLAX 2D LVIDd:         4.80 cm     Diastology LVIDs:         3.30 cm     LV e' lateral: 9.25 cm/s LV PW:         0.80 cm LV IVS:        0.80 cm LVOT diam:     2.10 cm LV SV:         68 LV SV Index:   36 LVOT Area:     3.46 cm  LV Volumes (MOD) LV vol d, MOD A2C: 51.8 ml LV vol d, MOD A4C: 60.2 ml LV vol s, MOD A2C: 16.0 ml LV vol s, MOD A4C: 26.1 ml LV SV MOD A2C:     35.8 ml LV SV MOD A4C:     60.2 ml LV SV MOD BP:      35.9 ml RIGHT VENTRICLE             IVC RV S prime:     21.40 cm/s  IVC diam: 2.50 cm TAPSE (M-mode): 2.1 cm LEFT ATRIUM              Index        RIGHT ATRIUM           Index LA diam:        4.10 cm  2.19 cm/m   RA Area:     18.20 cm LA Vol (A2C):   95.7 ml  51.08 ml/m  RA Volume:   50.20 ml  26.79 ml/m LA Vol (A4C):   126.0 ml 67.25 ml/m LA Biplane Vol: 108.0 ml 57.64 ml/m  AORTIC VALVE LVOT Vmax:   120.00 cm/s LVOT Vmean:  67.500 cm/s LVOT VTI:    0.195 m  AORTA Ao Root diam: 3.40 cm Ao Asc diam:  3.30 cm MR Peak grad:    64.0 mmHg    TRICUSPID VALVE MR Mean grad:    41.0 mmHg    TR Peak grad:   48.7 mmHg MR Vmax:         400.00 cm/s  TR Vmax:        349.00 cm/s  MR Vmean:         300.0 cm/s MR PISA:         5.09 cm     SHUNTS MR PISA Eff ROA: 39 mm       Systemic VTI:  0.20 m MR PISA Radius:  0.90 cm      Systemic Diam: 2.10 cm Juwuan Sedita McleanMD Electronically signed by Franki Monte Signature Date/Time: 07/31/2021/10:52:39 AM    Final (Updated)      Medications:     Current Medications:  albuterol  2.5 mg Nebulization Q6H   chlorhexidine  15 mL Mouth Rinse BID   Chlorhexidine Gluconate Cloth  6 each Topical Daily   heparin  5,000 Units Subcutaneous Q8H   mouth rinse  15 mL Mouth Rinse q12n4p   multivitamin with minerals  1 tablet Oral Daily   predniSONE  2 mg Oral Daily   sodium chloride flush  3 mL Intravenous Q12H    Infusions:  azithromycin     cefTRIAXone (ROCEPHIN)  IV        Assessment/Plan   Severe MR  - Echo w/ possible A2 partial flail. Highly eccentric and posteriorly directed - mid inferolateral hypokinesis may suggest ischemic MR/ acute papillary rupture vs chronic severe MR given severe LAE. Also ? Endocarditis.  - Plan intubation followed by TEE today  - Obtain surgical consult   2. 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 AKI - SCr 4.4, baseline <1.0  - 2/2 shock, see above   4. Acute Hypoxic Respiratory Failure - on BiPAP  - PCCM to intubate    Length of Stay: 1  Brittainy Simmons, PA-C  07/31/2021, 1:13 PM  Advanced Heart Failure Team Pager [phone] (M-F; 7a - 5p)  Please contact [facility] for night-coverage after hours (4p -7a ) and weekends on [email or web address]  Patient seen with PA, agree with the above note.   When I saw patient, she was on Bipap. History as outlined above.  Elevated lactate, creatinine 3.81 from baseline < 1.  Hs-TnI around 16,0000.  Echo showed severe, posteriorly-directed mitral regurgitation with an inferolateral wall motion  abnormality but overall normal LV systolic function.  TEE was then done, showing severe, posteriorly directed mitral regurgitation with partial flail A2 segment as well as small and restricted posterior leaflet.  After discussion with family, patient was intubated and I placed IABP and Swan.    RHC Procedural Findings: Hemodynamics (mmHg) RA mean 11 RV 36/12 PA 40/20, mean 35 PCWP mean 27 Oxygen saturations: PA 78% AO 100% Cardiac Output (Fick) 7.72  Cardiac Index (Fick) 4.08 Cardiac Output (Thermo) 5.32 Cardiac Index (Thermo) 2.81 PVR 1.5 WU  General: On Bipap.  Neck: JVP 14 cm, no thyromegaly or thyroid nodule.  Lungs: Clear to auscultation bilaterally with normal respiratory effort. CV: Nondisplaced PMI.  Heart tachy, regular S1/S2, no S3/S4, 2/6 HSM apex.  No peripheral edema.  No carotid bruit.  Normal pedal pulses.  Abdomen: Soft, nontender, no hepatosplenomegaly, no distention.  Skin: Intact without lesions or rashes.  Neurologic: Alert and oriented x 3.  Psych: Normal affect. Extremities: No clubbing or cyanosis.  HEENT: Normal.   1. Mitral regurgitation: TEE showed severe, eccentric posteriorly-directed mitral regurgitation.  There was prolapse/partial flail of the A2 segment of the anterior leaflet.  There was also restriction of  the posterior leaflet.  The left atrium was moderate to severely dilated, so I think that there was some degree of MR prior to the acute event.  I suspect that there was pre-existing prolapse/partial flail of A2 and patient had NSTEMI involving LCx territory, leading to lateral wall motion abnormality and restriction of the posterior leaflet with acute worsening of the mitral regurgitation. TEE does not show definite evidence for papillary muscle rupture.  I suspect that the cause of her cardiogenic shock is acute worsening of mitral regurgitation.  - I do not think that she is a candidate for cardiac surgery (age, severe AKI, prednisone prior to  admission).  - She may be a candidate for Mitraclip.  Reviewed with Drs Burt Knack and Ali Lowe, would be difficult procedure technically with small posterior leaflet.  - Plan will be to try to stabilize her renal function and hemodynamics with IABP and move towards Mitraclip Monday if we can do so.  2. AKI: Baseline creatinine < 1, 3.8 today.  Suspect this was due to cardiogenic shock with acute worsening of mitral regurgitation.   Follow trajectory of renal function with IABP.  She would be a poor dialysis candidate.  If renal function does not improve with IABP, will need to consider palliative care.  3. Acute diastolic CHF/cardiogenic shock: In setting of suspected acute on chronic mitral regurgitation and NSTEMI.  Elevated filling pressures on RHC, especially high PCWP (though surprisingly, she did not have prominent v-waves).   - Management with IABP for afterload reduction.  - Follow CO, filling pressures on Swan.  Will need eventual diuresis.  - Can add milrinone for further afterload reduction if needed.  - Start heparin gtt for IABP.  - Repeat lactate.  4. Rheumatoid arthritis: On prednisone 2 mg daily, Enbrel, and leflunomide at home. Need to consider risk of adrenal insufficiency off prednisone though she was on low dose.  Would ideally avoid stress dose steroids if possible.  5. Acute hypoxemic respiratory failure: Now intubated.  Pulmonary edema due to MR.   - CCM following.  6. ID: Afebrile, unlikely infectious etiology to presentation.  - For now, with critical illness, covering with vancomycin/cefepime.  7. CAD: Out of hospital NSTEMI with HS-TnI up to about 16,000.  Inferolateral wall motion abnormality suggests LCx territory MI.  Suspect that there is a component of acute infarct-related mitral regurgition.  - No cath at this time with AKI.  - ASA 81, statin.  - Heparin gtt as above.   Loralie Champagne 07/31/2021 9:29 PM

## 2021-07-31 NOTE — Progress Notes (Signed)
*  PRELIMINARY RESULTS* Echocardiogram 2D Echocardiogram has been performed.  [name] 07/31/2021, 4:30 PM

## 2021-07-31 NOTE — Progress Notes (Signed)
RT took pt off BIPAP for and break. Pt had to be placed back on for SOB and WOB.

## 2021-08-01 ENCOUNTER — Encounter (HOSPITAL_COMMUNITY): Payer: Self-pay | Admitting: Cardiology

## 2021-08-01 ENCOUNTER — Inpatient Hospital Stay (HOSPITAL_COMMUNITY): Payer: Medicare Other

## 2021-08-01 DIAGNOSIS — Z9911 Dependence on respirator [ventilator] status: Secondary | ICD-10-CM

## 2021-08-01 DIAGNOSIS — R570 Cardiogenic shock: Secondary | ICD-10-CM

## 2021-08-01 DIAGNOSIS — I340 Nonrheumatic mitral (valve) insufficiency: Secondary | ICD-10-CM

## 2021-08-01 DIAGNOSIS — N179 Acute kidney failure, unspecified: Secondary | ICD-10-CM | POA: Diagnosis not present

## 2021-08-01 DIAGNOSIS — I5031 Acute diastolic (congestive) heart failure: Secondary | ICD-10-CM | POA: Diagnosis not present

## 2021-08-01 DIAGNOSIS — R7401 Elevation of levels of liver transaminase levels: Secondary | ICD-10-CM

## 2021-08-01 DIAGNOSIS — J90 Pleural effusion, not elsewhere classified: Secondary | ICD-10-CM

## 2021-08-01 DIAGNOSIS — I214 Non-ST elevation (NSTEMI) myocardial infarction: Secondary | ICD-10-CM

## 2021-08-01 DIAGNOSIS — I509 Heart failure, unspecified: Secondary | ICD-10-CM

## 2021-08-01 DIAGNOSIS — I2510 Atherosclerotic heart disease of native coronary artery without angina pectoris: Secondary | ICD-10-CM

## 2021-08-01 LAB — CBC
HCT: 31.1 % — ABNORMAL LOW (ref 36.0–46.0)
Hemoglobin: 9.8 g/dL — ABNORMAL LOW (ref 12.0–15.0)
MCH: 31.6 pg (ref 26.0–34.0)
MCHC: 31.5 g/dL (ref 30.0–36.0)
MCV: 100.3 fL — ABNORMAL HIGH (ref 80.0–100.0)
Platelets: 130 10*3/uL — ABNORMAL LOW (ref 150–400)
RBC: 3.1 MIL/uL — ABNORMAL LOW (ref 3.87–5.11)
RDW: 13.3 % (ref 11.5–15.5)
WBC: 15.3 10*3/uL — ABNORMAL HIGH (ref 4.0–10.5)
nRBC: 0 % (ref 0.0–0.2)

## 2021-08-01 LAB — GLUCOSE, CAPILLARY
Glucose-Capillary: 98 mg/dL (ref 70–99)
Glucose-Capillary: 97 mg/dL (ref 70–99)
Glucose-Capillary: 104 mg/dL — ABNORMAL HIGH (ref 70–99)
Glucose-Capillary: 101 mg/dL — ABNORMAL HIGH (ref 70–99)
Glucose-Capillary: 98 mg/dL (ref 70–99)

## 2021-08-01 LAB — PROCALCITONIN: Procalcitonin: 2.25 ng/mL

## 2021-08-01 LAB — COMPREHENSIVE METABOLIC PANEL
ALT: 26 U/L (ref 0–44)
AST: 51 U/L — ABNORMAL HIGH (ref 15–41)
Albumin: 2.7 g/dL — ABNORMAL LOW (ref 3.5–5.0)
Alkaline Phosphatase: 48 U/L (ref 38–126)
Anion gap: 13 (ref 5–15)
BUN: 72 mg/dL — ABNORMAL HIGH (ref 8–23)
CO2: 19 mmol/L — ABNORMAL LOW (ref 22–32)
Calcium: 8.3 mg/dL — ABNORMAL LOW (ref 8.9–10.3)
Chloride: 107 mmol/L (ref 98–111)
Creatinine, Ser: 3.4 mg/dL — ABNORMAL HIGH (ref 0.44–1.00)
GFR, Estimated: 13 mL/min — ABNORMAL LOW (ref >60)
Glucose, Bld: 105 mg/dL — ABNORMAL HIGH (ref 70–99)
Potassium: 4.4 mmol/L (ref 3.5–5.1)
Sodium: 139 mmol/L (ref 135–145)
Total Bilirubin: 0.8 mg/dL (ref 0.3–1.2)
Total Protein: 5.2 g/dL — ABNORMAL LOW (ref 6.5–8.1)

## 2021-08-01 LAB — HEMOGLOBIN A1C
Hgb A1c MFr Bld: 5.2 % (ref 4.8–5.6)
Mean Plasma Glucose: 102.54 mg/dL

## 2021-08-01 LAB — COOXEMETRY PANEL
Carboxyhemoglobin: 0.8 % (ref 0.5–1.5)
Methemoglobin: 1.3 % (ref 0.0–1.5)
O2 Saturation: 83.4 %
Total hemoglobin: 10 g/dL — ABNORMAL LOW (ref 12.0–16.0)

## 2021-08-01 LAB — HEPARIN LEVEL (UNFRACTIONATED): Heparin Unfractionated: 0.23 IU/mL — ABNORMAL LOW (ref 0.30–0.70)

## 2021-08-01 LAB — TRIGLYCERIDES: Triglycerides: 145 mg/dL (ref <150)

## 2021-08-01 MED ORDER — ADULT MULTIVITAMIN W/MINERALS CH
1.0000 | ORAL_TABLET | Freq: Every day | ORAL | Status: DC
  Administered 2021-08-02: 1
  Filled 2021-08-01: qty 1

## 2021-08-01 MED ORDER — PANTOPRAZOLE 2 MG/ML SUSPENSION
40.0000 mg | Freq: Every day | ORAL | Status: DC
  Administered 2021-08-02: 40 mg
  Filled 2021-08-01: qty 20

## 2021-08-01 MED ORDER — SODIUM CHLORIDE 0.9% FLUSH
10.0000 mL | Freq: Two times a day (BID) | INTRAVENOUS | Status: DC
  Administered 2021-08-01: 20 mL
  Administered 2021-08-02 – 2021-08-03 (×3): 10 mL
  Administered 2021-08-03: 20 mL
  Administered 2021-08-04: 30 mL
  Administered 2021-08-04 – 2021-08-06 (×4): 10 mL

## 2021-08-01 MED ORDER — POLYETHYLENE GLYCOL 3350 17 G PO PACK: 17.0000 g | PACK | Freq: Every day | ORAL | Status: DC | PRN

## 2021-08-01 MED ORDER — PREDNISONE 1 MG PO TABS
2.0000 mg | ORAL_TABLET | Freq: Every day | ORAL | Status: DC
  Administered 2021-08-02: 2 mg
  Filled 2021-08-01 (×2): qty 2

## 2021-08-01 MED ORDER — FUROSEMIDE 10 MG/ML IJ SOLN
40.0000 mg | Freq: Once | INTRAMUSCULAR | Status: AC
  Administered 2021-08-01: 40 mg via INTRAVENOUS
  Filled 2021-08-01: qty 4

## 2021-08-01 MED ORDER — SODIUM CHLORIDE 0.9% FLUSH: 10.0000 mL | INTRAVENOUS | Status: DC | PRN

## 2021-08-01 MED ORDER — MIDAZOLAM HCL 2 MG/2ML IJ SOLN
0.5000 mg | INTRAMUSCULAR | Status: DC | PRN
  Administered 2021-08-01: 0.5 mg via INTRAVENOUS
  Filled 2021-08-01: qty 2

## 2021-08-01 NOTE — Progress Notes (Addendum)
ANTICOAGULATION CONSULT NOTE  Pharmacy Consult for heparin Indication:  IABP  Allergies  Allergen Reactions   Methotrexate Derivatives Other (See Comments)    Dangerously decreased platelet count   Penicillins Itching    Tolerated Cephalosporin Date: 04/30/20.     Sulfa Antibiotics Itching    Patient Measurements: Height: 5\' 7"  (170.2 cm) Weight: 70.2 kg (154 lb 12.2 oz) IBW/kg (Calculated) : 61.6 Heparin Dosing Weight: 77kg  Vital Signs: Temp: 99.5 F (37.5 C) (10/21 1329) Temp Source: Core (10/21 0400) BP: 120/81 (10/21 1300) Pulse Rate: 99 (10/21 1329)  Labs: Recent Labs    07/30/21 1351 07/30/21 1624 07/30/21 1945 07/30/21 2119 07/31/21 0453 07/31/21 1556 07/31/21 1842 07/31/21 1843 08/01/21 0339  HGB 12.2  --   --   --  10.9*   < > 10.5* 11.2* 9.8*  HCT 35.7*  --   --   --  33.7*   < > 31.0* 33.0* 31.1*  PLT 212  --   --   --  153  --   --   --  130*  APTT 23*  --   --   --   --   --   --   --   --   LABPROT 13.4  --   --   --   --   --   --   --   --   INR 1.0  --   --   --   --   --   --   --   --   HEPARINUNFRC  --   --   --   --   --   --   --   --  0.23*  CREATININE 4.43*  --   --   --  3.81*  --   --   --  3.40*  TROPONINIHS 72,620* 16,254* 13,992* 14,937*  --   --   --   --   --    < > = values in this interval not displayed.     Estimated Creatinine Clearance: 12.6 mL/min (A) (by C-G formula based on SCr of 3.4 mg/dL (H)).   Assessment: 80 yo female admitted with cardiogenic shock s/p IABP for heparin. Will remain on IABP until plan for MitraClip on Monday. No AC PTA.   HL this AM therapeutic at 0.23 on 750 units/hr. Hemoglobin dropped from 11 to 9.8 and platelets decreased 153 > 130. However, just started IABP last night, will monitor closely.   Goal of Therapy:  Heparin level 0.2-0.5 units/ml Monitor platelets by anticoagulation protocol: Yes   Plan:  Continue Heparin at 750 units/hr Daily CBC and HL while on heparin  Cathrine Muster, PharmD PGY2 Cardiology Pharmacy Resident Phone: [phone] 08/01/2021  1:32 PM  Please check [email or web address] for unit-specific pharmacy phone numbers.

## 2021-08-01 NOTE — Progress Notes (Signed)
Pt CVP elevated from this am. CVP now reading 12-14. Aundra Dubin, MD at bedside, see new order for lasix 40 mg once.

## 2021-08-01 NOTE — Progress Notes (Signed)
Pt very agitated despite increasing fentanyl gtt and attempting to pull out ETT. 0.5 mg versed given iv. Mittens applied.   Per MD, please turn sedation off by 0400 on 10/22 in hopes of being able to perform SBT.   Monitoring closely. VSS.

## 2021-08-01 NOTE — Progress Notes (Signed)
[name] DOB Dec 04, 1940  This looks like a clippable valve. The fossa looks approachable for transseptal puncture in the Bicaval and SAXB views. LA dimensions are just large enough for device steering and straddle; We will need to be very strategic about the transseptal puncture. The MR jet is eccentric and posteriorly directed. 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 The posterior leaflet varies and measures .7-.9 cm in LVOT grasping views. Gradient measures about 2.7-3 mmHG with a heart rate of 100 BPM; MVA measures about 5 cm2. Based on this information, I'd start with an NTW and assess for gradient.

## 2021-08-01 NOTE — Progress Notes (Signed)
NAME:  [name], MRN:  [id number], DOB:  04-25-1941, LOS: 2 ADMISSION DATE:  07/30/2021, CONSULTATION DATE:  07/31/21 REFERRING MD:  Dr. Beryl Meager, CHIEF COMPLAINT:  Weakness   History of Present Illness:  80 y/o F who presented to [facility] on 10/19 with reports of weakness.   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  She reported on presentation that she had been feeling increasingly weak over the preceding four days.  She went to get a flu shot two days ago and seemed somewhat altered per family.  The weakness progressed to the point she could not sit or stand on her own which is a marked change from her baseline.  In the ER she reported shortness of breath & vague abdominal pain prior to presentation.  Initial ER evaluation notable for SBP 80-100's and new O2 need of 2-6L.  She was transitioned to BiPAP support after IVF.  Labs showed Na 133, Bicarb 18, BUN 62, Cr 4.43 (baseline 0.7), AST 112, WBC 20.4, troponin of 16, 652, lactic acid 2.62.  She was negative for COVID and influenza.  CXR showed small left pleural effusion, mild atelectasis on right & bibasilar atelectasis.  EKG showed ST changes thought to be non-specific.  She was admitted per [facility] for further work up with working diagnosis of possible PNA / infectious etiology of weakness.  She had improvement in BP and lactate with IVF + abx.  Subsequent ECHO showed an LVEF of 60-65%, LV with regional wall motion abnormalities with mid inferolateral hypokinesis, grade II diastolic dysfunction, RVSP of 63.7, LA severely dilated, RA mildly dilated, question of flail mitral valve.  ECHO findings concerning for MI with papillary muscle rupture and severe MR.  She remained on BiPAP for shortness of breath.     PCCM consulted for pulmonary evaluation.   Pertinent  Medical History  Degenerative Disease  Normal Pressure Hydrocephalus s/p Stent Neuropathy  Vertigo  HLD Depression  Former Smoker - smoked total 20 years RA  - on leflunomide, prednisone  Retired Therapist, sports   Significant Hospital Events: Including procedures, antibiotic start and stop dates in addition to other pertinent events   10/19 Admit with weakness, SOB, elevated troponin  10/20 PCCM consulted, intubated, TEE, IABP, Swan  Interim History / Subjective:  She denies complaints.   Objective   Blood pressure 102/77, pulse 91, temperature 99.1 F (37.3 C), resp. rate 20, height '5\' 7"'$  (1.702 m), weight 70.2 kg, SpO2 100 %. PAP: (30-45)/(14-29) 33/16 CVP:  [5 mmHg-19 mmHg] 5 mmHg PCWP:  [29 mmHg] 29 mmHg CO:  [5.3 L/min-8 L/min] 8 L/min CI:  [2.8 L/min/m2-4.2 L/min/m2] 4.2 L/min/m2  Vent Mode: PRVC FiO2 (%):  [40 %-100 %] 60 % Set Rate:  [10 bmp-18 bmp] 18 bmp Vt Set:  [490 mL] 490 mL PEEP:  [5 cmH20] 5 cmH20 Plateau Pressure:  [16 cmH20-20 cmH20] 20 cmH20   Intake/Output Summary (Last 24 hours) at 08/01/2021 0713 Last data filed at 08/01/2021 0700 Gross per 24 hour  Intake 926.07 ml  Output 580 ml  Net 346.07 ml    Filed Weights   07/31/21 0100 07/31/21 1048 08/01/21 0353  Weight: 75.8 kg 77.7 kg 70.2 kg    Examination: General: critically ill appearing woman sitting up in bed in NAD HENT: [place] Gonzalez/AT, eyes anicteric, ETT in place Lungs: breathing comfortably on MV, CTAB, decreased in bases Cardiovascular: S1-S2, regular rate and rhythm, apical murmur Abdomen: Soft, nontender, nondistended Extremities: Balloon pump, no  lower extremity edema.  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  Baseline MV disease but  progression noted on ECHO.  -Continue IABP per cardiology -Tentatively planning for mitral clip attempt on Monday.  Not a surgical candidate. -Blood cultures pending, continue broad-spectrum antibiotics given possibility of endocarditis precipitating her acute presentation, although this seems less likely with regional wall motion abnormalities on echo  Acute hypoxic respiratory failure  Acute pulmonary edema Left pleural effusion with atelectasis  In setting of edema, atelectasis.  -LTV, 48 cc/kg ideal body weight with goal plateaus and 30 and driving pressure less than 15. - VAP.  Protonix-pad protocol for sedation.  Doing well on fentanyl and as needed Versed. - Daily SAT and SBT as appropriate.  Contact family today.  May be able to attempt SBT today, but it is tough to predict how she will do off positive pressure regarding pulmonary edema. -If not extubating today we will start tube feeds  AKI stable Baseline renal function normal.  Suspect pre-renal.   -Continue to monitor renal function - Strict I's/O - Renally dose meds and avoid nephrotoxic meds  Elevated LFT's  Likely congestive hepatopathy.  -Continue to monitor  Leukocytosis  Immune suppressed  Doubt infectious etiology, more likely stress response from MI.  Note immune suppression with RA therapy-leflunomide, low-dose prednisone, Enbrel.  -Follow-up blood cultures - Continue broad-spectrum antibiotics  Hx HTN -Continue holding home meds  Rheumatoid arthritis  -continue low-dose prednisone; may need stress dose steroids if significantly hypotensive -Hold leflunomide and Enbrel  Normal pressure hydrocephalus  Hx shunt. CT head with shunt in place, slight enlargement of ventricles.  CT Abd/Pelvis with small amt free fluid in abdomen.  -No intervention needed  Hyperglycemia, A1c 5.2. No insulin needs overnight.  -Continue to monitor - Goal BG less than 180  Best Practice (right click and "Reselect all SmartList  Selections" daily)  Diet/type: NPO DVT prophylaxis: prophylactic heparin  GI prophylaxis: N/A Lines: N/A Foley:  N/A Code Status:  DNR.  Patient accepting of short term intubation if needed for procedure.  Last date of multidisciplinary goals of care discussion: family updated 10/20 at bedside on plan of care.   Labs   CBC: Recent Labs  Lab 07/30/21 1351 07/31/21 0453 07/31/21 1556 07/31/21 1842 07/31/21 1843 08/01/21 0339  WBC 20.4* 17.9*  --   --   --  15.3*  NEUTROABS 12.8*  --   --   --   --   --   HGB 12.2 10.9* 11.6* 10.5* 11.2* 9.8*  HCT 35.7* 33.7* 34.0* 31.0* 33.0* 31.1*  MCV 93.0 96.6  --   --   --  100.3*  PLT 212 153  --   --   --  130*     Basic Metabolic Panel: Recent Labs  Lab 07/30/21 1351 07/31/21 0453 07/31/21 1556 07/31/21 1842 07/31/21 1843 08/01/21 0339  NA 133* 135 137 138 138 139  K 4.4 4.2 4.1 4.3 4.3 4.4  CL 102 104  --   --   --  107  CO2 18*  18*  --   --   --  19*  GLUCOSE 136* 133*  --   --   --  105*  BUN 62* 60*  --   --   --  72*  CREATININE 4.43* 3.81*  --   --   --  3.40*  CALCIUM 9.5 8.8*  --   --   --  8.3*    GFR: Estimated Creatinine Clearance: 12.6 mL/min (A) (by C-G formula based on SCr of 3.4 mg/dL (H)). Recent Labs  Lab 07/30/21 1351 07/30/21 1550 07/31/21 0453 07/31/21 2113 08/01/21 0339  PROCALCITON  --   --   --   --  2.25  WBC 20.4*  --  17.9*  --  15.3*  LATICACIDVEN 2.6* 2.0*  --  1.2  --      Liver Function Tests: Recent Labs  Lab 07/30/21 1351 07/31/21 0453 08/01/21 0339  AST 112* 79* 51*  ALT '31 30 26  '$ ALKPHOS 58 51 48  BILITOT 1.0 1.0 0.8  PROT 6.9 6.3* 5.2*  ALBUMIN 4.2 3.6 2.7*    No results for input(s): LIPASE, AMYLASE in the last 168 hours. No results for input(s): AMMONIA in the last 168 hours.  ABG    Component Value Date/Time   PHART 7.347 (L) 07/31/2021 1556   PCO2ART 35.7 07/31/2021 1556   PO2ART 199 (H) 07/31/2021 1556   HCO3 21.5 07/31/2021 1843   TCO2 23 07/31/2021  1843   ACIDBASEDEF 6.0 (H) 07/31/2021 1843   O2SAT 83.4 08/01/2021 0339     This patient is critically ill with multiple organ system failure which requires frequent high complexity decision making, assessment, support, evaluation, and titration of therapies. This was completed through the application of advanced monitoring technologies and extensive interpretation of multiple databases. During this encounter critical care time was devoted to patient care services described in this note for 36 minutes.  Julian Hy, DO 08/01/21 8:02 AM [facility]

## 2021-08-01 NOTE — Progress Notes (Signed)
Initial Nutrition Assessment  DOCUMENTATION CODES:   Not applicable  INTERVENTION:   -If unable to extubate within 48 hours, recommend:  Initiate Vital 1.5 @ 45 ml/hr via OGT (1080 ml daily)  45 ml Prosource TF daily.    Tube feeding regimen provides 1660 kcal (100% of needs), 84 grams of protein, and 825 ml of H2O.     NUTRITION DIAGNOSIS:   Inadequate oral intake related to inability to eat as evidenced by NPO status.  GOAL:   Patient will meet greater than or equal to 90% of their needs  MONITOR:   Vent status, Labs, Weight trends, Skin, I & O's  REASON FOR ASSESSMENT:   Ventilator    ASSESSMENT:   80 y/o F who presented to [facility] on 10/19 with reports of weakness.  Pt admitted with suspected silent recent MI, severe MR, acute heart failure, and lt pleural effusion.   Patient is currently intubated on ventilator support. OGT in place. MV: 7.6 L/min Temp (24hrs), Avg:99.3 F (37.4 C), Min:98.8 F (37.1 C), Max:99.9 F (37.7 C)  Reviewed I/O's: +346 ml x 24 hours and +2.1 L since admission  UOP: 580 ml x 24 hours  Case discussed with RN, who reports pt has trouble breathing on her own during weaning trials. She confirms plan to start TF if unable to extubate today.   Case discussed with MD, who reports that pt just received fentanyl and may still extubate today. She requests to hold off on TF for now.   Medications reviewed and include colace, miralax, and prednisone.   Labs reviewed: CBGS: 97-113 (inpatient orders for glycemic control are 1-3 units inuslin aspart every 4 hours).    NUTRITION - FOCUSED PHYSICAL EXAM:  Flowsheet Row Most Recent Value  Orbital Region Mild depletion  Upper Arm Region No depletion  Thoracic and Lumbar Region No depletion  Buccal Region No depletion  Temple Region No depletion  Clavicle Bone Region No depletion  Clavicle and Acromion Bone Region No depletion  Scapular Bone Region No depletion  Dorsal Hand No depletion   Patellar Region No depletion  Anterior Thigh Region No depletion  Posterior Calf Region No depletion  Edema (RD Assessment) Mild  Hair Reviewed  Eyes Reviewed  Mouth Reviewed  Skin Reviewed  Nails Reviewed       Diet Order:   Diet Order             Diet NPO time specified  Diet effective now                   EDUCATION NEEDS:   Not appropriate for education at this time  Skin:  Skin Assessment: Reviewed RN Assessment  Last BM:  07/30/21  Height:   Ht Readings from Last 1 Encounters:  07/31/21 5\' 7"  (1.702 m)    Weight:   Wt Readings from Last 1 Encounters:  08/01/21 70.2 kg   BMI:  Body mass index is 24.24 kg/m.  Estimated Nutritional Needs:   Kcal:  1636  Protein:  80-95 grams  Fluid:  > 1.6 L    Loistine Chance, RD, LDN, [place] Registered Dietitian II Certified Diabetes Care and Education Specialist Please refer to [facility] for RD and/or RD on-call/weekend/after hours pager

## 2021-08-01 NOTE — Plan of Care (Signed)
  Problem: Education: Goal: Knowledge of General Education information will improve Description: Including pain rating scale, medication(s)/side effects and non-pharmacologic comfort measures Outcome: Progressing   Problem: Clinical Measurements: Goal: Ability to maintain clinical measurements within normal limits will improve Outcome: Progressing Goal: Will remain free from infection Outcome: Progressing Goal: Diagnostic test results will improve Outcome: Progressing Goal: Cardiovascular complication will be avoided Outcome: Progressing   

## 2021-08-01 NOTE — Progress Notes (Signed)
ANTICOAGULATION CONSULT NOTE  Pharmacy Consult for heparin Indication:  IABP  Allergies  Allergen Reactions   Methotrexate Derivatives Other (See Comments)    Dangerously decreased platelet count   Penicillins Itching    Tolerated Cephalosporin Date: 04/30/20.     Sulfa Antibiotics Itching    Patient Measurements: Height: 5\' 7"  (170.2 cm) Weight: 70.2 kg (154 lb 12.2 oz) IBW/kg (Calculated) : 61.6 Heparin Dosing Weight: 77kg  Vital Signs: Temp: 99 F (37.2 C) (10/21 0500) Temp Source: Core (10/21 0400) BP: 93/64 (10/21 0500) Pulse Rate: 86 (10/21 0500)  Labs: Recent Labs    07/30/21 1351 07/30/21 1624 07/30/21 1945 07/30/21 2119 07/31/21 0453 07/31/21 1556 07/31/21 1842 07/31/21 1843 08/01/21 0339  HGB 12.2  --   --   --  10.9*   < > 10.5* 11.2* 9.8*  HCT 35.7*  --   --   --  33.7*   < > 31.0* 33.0* 31.1*  PLT 212  --   --   --  153  --   --   --  130*  APTT 23*  --   --   --   --   --   --   --   --   LABPROT 13.4  --   --   --   --   --   --   --   --   INR 1.0  --   --   --   --   --   --   --   --   HEPARINUNFRC  --   --   --   --   --   --   --   --  0.23*  CREATININE 4.43*  --   --   --  3.81*  --   --   --  3.40*  TROPONINIHS 25,366* 16,254* 13,992* 14,937*  --   --   --   --   --    < > = values in this interval not displayed.     Estimated Creatinine Clearance: 12.6 mL/min (A) (by C-G formula based on SCr of 3.4 mg/dL (H)).   Assessment: 80 yo female admitted with cardiogenic shock s/p IABP for heparin.  Goal of Therapy:  Heparin level 0.2-0.5 units/ml Monitor platelets by anticoagulation protocol: Yes   Plan:  Continue Heparin at current rate   Phillis Knack, PharmD, BCPS

## 2021-08-01 NOTE — Consult Note (Addendum)
[facility] VALVE TEAM  Inpatient MitraClip Consultation:   Patient ID: [name]; [id number]; [birth year]   Admit date: 07/30/2021 Date of Consult: 08/01/2021  Primary Care Provider: Javier Glazier, MD Primary Cardiologist: new- Dr. Aundra [name] Primary Electrophysiologist:  none   Patient Profile:   [name] is a 80 y.o. female with a hx of pressure hydrocephalus s/p stent, RA on Enbrel, leflunomide and chronic prednisone, HTN, HLD and former tobacco abuse who is being seen today for the evaluation of severe MR at the request of Dr. Aundra [name].  History of Present Illness:   Ms. [name] lives at home with her husband.  She has chronic physical debilitation from longstanding rheumatoid arthritis.  She walks very slowly with a walker.  She is occasionally able to get out of the house to go to church or the store.  She has strong family support with her 2 sons who both live locally.  One of her sons, is Dr. Lajean [name] who works for our ER department.  They had been recently discussing getting nursing care help in the home due to her inability to take care of herself and her husband.  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  Per her son, [name], she had a mediocre quality of life and he is not sure she would be happy with further debilitation.  They are eager for extubation so that they can discuss her wishes with her personally.  Her family has noticed a gradual slowing down of their mother over the past several months.  They have noticed worsening in her stamina, breathing and fatigue.  She was in her usual state of health until approximately 4 days prior to admission when she developed progressive weakness and altered mental status. She was brought to [facility] ED on 07/30/21 for evaluation.    In the ER she reported shortness of breath & vague abdominal pain prior to presentation. Initial ER evaluation notable for SBP 80-100's and new O2 need of  2-6L. She was transitioned to BiPAP support after IVFs.  Labs showed Na 133, Bicarb 18, BUN 62, Cr 4.43 (baseline 0.7), AST 112, WBC 20.4, lactic acid 2.62, Hs trop 16,652>>16,254>>13,992>>14,937.  She was negative for COVID and influenza.  CXR showed small left pleural effusion, mild atelectasis on right & bibasilar atelectasis.  EKG showed ST changes thought to be non-specific.   2D echo showed EF 60 to 65%, mild inferolateral hypokinesis normal RV, severely dilated left atrium, and severe mitral regurgitation with a possible A2 partial flail leaflet.  Her MR was highly eccentric and posteriorly directed.  She was transferred to [facility] for management of her severe mitral regurgitation by the advanced heart failure team.  Given acute hypoxemic respiratory failure on BiPAP and need for TEE, the decision was made for intubation.  Additionally, Dr. Aundra [name] placed an IABP and Swan catheter.  Right heart catheterization showed elevated filling pressures with a high PCWP.  Surprisingly, she did not have significant V waves. LHC deferred given AKI.   TEE showed severe, eccentric posteriorly-directed mitral regurgitation. There was prolapse/partial flail of the A2 segment of the anterior leaflet.  There was also restriction of the posterior leaflet.  Given severe left atrial dilation, it was suspected that she had had some degree of MR prior to this acute event.  It was suspect that there was pre-existing prolapse/partial flail of A2 and patient had NSTEMI involving LCx territory, leading to lateral wall motion abnormality and  restriction of the posterior leaflet with acute worsening of the mitral regurgitation and eventually causing cardiogenic shock. TEE did not show definite evidence for papillary muscle rupture.   Blood pressure and renal function improved after balloon pump insertion.  Creatinine down to 3.4 (peaked at 4.42).  Lactic acid downtrending from 2.6-->1.2.  The structural heart team is  consulted for consideration of MitraClip.  The patient is seen laying in bed intubated and slightly sedated.  Her family is present at bedside.  Review of systems is not available due to patient intubated state.  The family is very hopeful that she will get extubated today so that they can discuss plan of care with her.   Past Medical History:  Diagnosis Date   Abnormality of gait 04/17/2013   Cervical spondylosis    Cervicogenic headache    Right occipital neuralgia   Depression    Dyslipidemia    Gait disorder    Hyperlipidemia    Hypertension    Normal pressure hydrocephalus (HCC)    Occipital neuralgia 04/17/2013   right   Osteomyelitis of left knee region [facility])    Rheumatoid arthritis(714.0)    Vertigo 02/19/2015    Past Surgical History:  Procedure Laterality Date   CERVICAL SPINE SURGERY     IABP INSERTION N/A 07/31/2021   Procedure: IABP INSERTION;  Surgeon: Larey Dresser, MD;  Location: [place] CV LAB;  Service: Cardiovascular;  Laterality: N/A;   JOINT REPLACEMENT     LUMBAR SPINE SURGERY     OVARY SURGERY     REPLACEMENT TOTAL KNEE Left    TOTAL KNEE ARTHROPLASTY Right 04/29/2020   Procedure: TOTAL KNEE ARTHROPLASTY;  Surgeon: [name], MD;  Location: [facility] ORS;  Service: Orthopedics;  Laterality: Right;  52min   VENTRICULO-PERITONEAL SHUNT PLACEMENT / LAPAROSCOPIC INSERTION PERITONEAL CATHETER       Inpatient Medications: Scheduled Meds:  albuterol  2.5 mg Nebulization Q6H   aspirin  81 mg Per Tube Daily   atorvastatin  80 mg Per Tube Daily   chlorhexidine gluconate (MEDLINE KIT)  15 mL Mouth Rinse BID   chlorhexidine gluconate (MEDLINE KIT)  15 mL Mouth Rinse BID   Chlorhexidine Gluconate Cloth  6 each Topical Daily   docusate  100 mg Per Tube BID   fentaNYL (SUBLIMAZE) injection  25 mcg Intravenous Once   insulin aspart  1-3 Units Subcutaneous Q4H   mouth rinse  15 mL Mouth Rinse 10 times per day   mouth rinse  15 mL Mouth Rinse 10 times per day    multivitamin with minerals  1 tablet Oral Daily   pantoprazole (PROTONIX) IV  40 mg Intravenous Daily   polyethylene glycol  17 g Per Tube Daily   predniSONE  2 mg Oral Daily   sodium chloride flush  3 mL Intravenous Q12H   vancomycin variable dose per unstable renal function (pharmacist dosing)   Does not apply See admin instructions   Continuous Infusions:  ceFEPime (MAXIPIME) IV Stopped (07/31/21 2210)   fentaNYL infusion INTRAVENOUS 150 mcg/hr (08/01/21 0900)   heparin 750 Units/hr (08/01/21 0900)   PRN Meds: acetaminophen **OR** acetaminophen, fentaNYL, midazolam, midazolam, polyethylene glycol  Allergies:    Allergies  Allergen Reactions   Methotrexate Derivatives Other (See Comments)    Dangerously decreased platelet count   Penicillins Itching    Tolerated Cephalosporin Date: 04/30/20.     Sulfa Antibiotics Itching    Social History:   Social History   Socioeconomic History   Marital  status: Married    Spouse name: Not on file   Number of children: 2   Years of education: 16   Highest education level: Not on file  Occupational History   Occupation: Retired    Fish farm manager: RETIRED  Tobacco Use   Smoking status: Former   Smokeless tobacco: Never  Scientific laboratory technician Use: Never used  Substance and Sexual Activity   Alcohol use: Not Currently   Drug use: No   Sexual activity: Not on file  Other Topics Concern   Not on file  Social History Narrative   Patient lives at home with her husband.    Patient has 2 children.    Patient has 12 + years of school.    Patient is left handed.   Patient drinks about 2 cups of caffeine daily.   Social Determinants of Health   Financial Resource Strain: Not on file  Food Insecurity: Not on file  Transportation Needs: Not on file  Physical Activity: Not on file  Stress: Not on file  Social Connections: Not on file  Intimate Partner Violence: Not on file    Family History:   The patient's family history includes  Brain cancer in her sister; Heart disease in her father; Ovarian cancer in her mother.  ROS:  Please see the history of present illness.  ROS  All other ROS reviewed and negative.     Physical Exam/Data:   Vitals:   08/01/21 0909 08/01/21 0910 08/01/21 0911 08/01/21 0912  BP:      Pulse: 91 90 90 92  Resp: $Remo'13 18 18 18  'uTwwo$ Temp: 99.1 F (37.3 C) 99.1 F (37.3 C) 99.1 F (37.3 C) 99.1 F (37.3 C)  TempSrc:      SpO2: 97% 97% 97% 97%  Weight:      Height:        Intake/Output Summary (Last 24 hours) at 08/01/2021 0927 Last data filed at 08/01/2021 0900 Gross per 24 hour  Intake 971.04 ml  Output 595 ml  Net 376.04 ml   Filed Weights   07/31/21 0100 07/31/21 1048 08/01/21 0353  Weight: 75.8 kg 77.7 kg 70.2 kg   Body mass index is 24.24 kg/m.  General: Elderly white female, intubated and lightly sedated.  Appears slightly uncomfortable HEENT: normal Lymph: no adenopathy Cardiac:  normal S1, S2; RRR; 3 out of 6 holosystolic murmur at apex. Lungs:  clear to auscultation bilaterally, no wheezing, rhonchi or rales  Abd: soft, nontender, no hepatomegaly  Ext: no edema Musculoskeletal:  No deformities, BUE and BLE strength normal and equal Skin: warm and dry.  IABP in place. Neuro:  CNs 2-12 intact, no focal abnormalities noted Psych:  Normal affect   EKG:  The EKG was personally reviewed and demonstrates:  sinus with anterior q waves. Telemetry:  Telemetry was personally reviewed and demonstrates: Sinus tach with occasional PVCs.  Relevant CV Studies: Echo 07/31/21 1. Left ventricular ejection fraction, by estimation, is 60 to 65%. The  left ventricle has normal function. The left ventricle demonstrates  regional wall motion abnormalities with mid inferolateral hypokinesis.  Left ventricular diastolic parameters are  consistent with Grade II diastolic dysfunction (pseudonormalization).   2. Right ventricular systolic function is normal. The right ventricular  size is  normal. There is severely elevated pulmonary artery systolic  pressure. The estimated right ventricular systolic pressure is 26.3 mmHg.   3. Left atrial size was severely dilated.   4. Right atrial size was mildly dilated.  5. The mitral valve is abnormal. Severe mitral valve regurgitation. MR is  highly eccentric and posteriorly directed. In the parasternal long axis  view, I question possible A2 partial flail. There is splay artifact noted.  PISA ERO 0.39 cm^2 (not likely  to be completely accurate as off axis). No evidence of mitral stenosis.   6. The aortic valve is tricuspid. Aortic valve regurgitation is not  visualized. No aortic stenosis is present.   7. The inferior vena cava is dilated in size with <50% respiratory  variability, suggesting right atrial pressure of 15 mmHg.   _____________________________  TEE 07/31/21 IMPRESSIONS  1. Left ventricular ejection fraction, by estimation, is 55%. The left  ventricle has normal function. The left ventricle demonstrates regional  wall motion abnormalities with hypokinesis of the mid lateral wall.   2. Right ventricular systolic function is normal. The right ventricular  size is normal.   3. The mitral valve is abnormal. Severe mitral valve regurgitation. There  was prolapse/partial flail of the A2 segment as well as posterior leaflet  restriction likely from the lateral wall hypokinesis. Suspect combination  of infarct-related MR and MR due to partial flail A2 with eccentric posteriorly-directed jet. I was worried about rupture of papillary muscle but no definite evidence for this. The dilated left atrium suggests that there was some pre-existing mitral regurgitation that may have  become acutely worse with MI involving inferolateral wall. No evidence of  mitral stenosis. The mean mitral valve gradient is 3.0 mmHg.   4. Peak RV-RA gradient 26 mmHg.   5. The aortic valve is tricuspid. Aortic valve regurgitation is not  visualized.  No aortic stenosis is present.   6. No ASD or PFO by color doppler.   7. Left atrial size was moderately dilated. No left atrial/left atrial  appendage thrombus was detected.   ____________________  CARDIAC CATHETERIZATION Result Date: 07/31/2021 1. Severe MR with elevated filling pressures. 2. IABP inserted successfully.  Start heparin gtt for IABP.   Laboratory Data:  Chemistry Recent Labs  Lab 07/30/21 1351 07/31/21 0453 07/31/21 1556 07/31/21 1842 07/31/21 1843 08/01/21 0339  NA 133* 135   < > 138 138 139  K 4.4 4.2   < > 4.3 4.3 4.4  CL 102 104  --   --   --  107  CO2 18* 18*  --   --   --  19*  GLUCOSE 136* 133*  --   --   --  105*  BUN 62* 60*  --   --   --  72*  CREATININE 4.43* 3.81*  --   --   --  3.40*  CALCIUM 9.5 8.8*  --   --   --  8.3*  GFRNONAA 9* 11*  --   --   --  13*  ANIONGAP 13 13  --   --   --  13   < > = values in this interval not displayed.    Recent Labs  Lab 07/30/21 1351 07/31/21 0453 08/01/21 0339  PROT 6.9 6.3* 5.2*  ALBUMIN 4.2 3.6 2.7*  AST 112* 79* 51*  ALT $Re'31 30 26  'TLE$ ALKPHOS 58 51 48  BILITOT 1.0 1.0 0.8   Hematology Recent Labs  Lab 07/30/21 1351 07/31/21 0453 07/31/21 1556 07/31/21 1842 07/31/21 1843 08/01/21 0339  WBC 20.4* 17.9*  --   --   --  15.3*  RBC 3.84* 3.49*  --   --   --  3.10*  HGB  12.2 10.9*   < > 10.5* 11.2* 9.8*  HCT 35.7* 33.7*   < > 31.0* 33.0* 31.1*  MCV 93.0 96.6  --   --   --  100.3*  MCH 31.8 31.2  --   --   --  31.6  MCHC 34.2 32.3  --   --   --  31.5  RDW 13.2 13.2  --   --   --  13.3  PLT 212 153  --   --   --  130*   < > = values in this interval not displayed.   Cardiac EnzymesNo results for input(s): TROPONINI in the last 168 hours. No results for input(s): TROPIPOC in the last 168 hours.  BNP Recent Labs  Lab 07/30/21 1945  BNP 2,001.3*    DDimer No results for input(s): DDIMER in the last 168 hours.  Radiology/Studies:  CT ABDOMEN PELVIS WO CONTRAST  Result Date:  07/30/2021 CLINICAL DATA:  Acute abdominal pain. EXAM: CT ABDOMEN AND PELVIS WITHOUT CONTRAST TECHNIQUE: Multidetector CT imaging of the abdomen and pelvis was performed following the standard protocol without IV contrast. COMPARISON:  None. FINDINGS: Lower chest: There are small bilateral pleural effusions. There is some patchy airspace disease in the left lower lobe and atelectasis in the right lower lobe. Hepatobiliary: No focal liver abnormality is seen. No gallstones, gallbladder wall thickening, or biliary dilatation. Pancreas: Unremarkable. No pancreatic ductal dilatation or surrounding inflammatory changes. Spleen: Normal in size without focal abnormality. Adrenals/Urinary Tract: There are small left peripelvic cysts. The kidneys, adrenal glands, ureters and bladder are otherwise within normal limits. Stomach/Bowel: Stomach is within normal limits. Appendix appears normal. No evidence of bowel wall thickening, distention, or inflammatory changes. There is sigmoid colon diverticulosis without evidence for acute diverticulitis. Vascular/Lymphatic: Aortic atherosclerosis. No enlarged abdominal or pelvic lymph nodes. Reproductive: Uterus and bilateral adnexa are unremarkable. Other: There is a small amount of free fluid in the pelvis. VP shunt catheter is present entering the right abdomen with distal catheter tip located in the right pelvis. The visualized catheter appears intact. There is a small fat containing umbilical hernia. Musculoskeletal: Multilevel degenerative changes affect the spine. There is mild chronic compression deformity of the superior endplate of B35. There is a healed right inferior pubic ramus fracture. There are severe degenerative changes of both hips. No acute fractures are identified. IMPRESSION: 1. Small amount of free fluid in the pelvis. 2. 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 3. Colonic diverticulosis without evidence for diverticulitis. 4. Small bilateral pleural effusions with left lower  lobe airspace disease worrisome for infection. 5.  Aortic Atherosclerosis (ICD10-I70.0). Electronically Signed   By: Ronney Asters M.D.   On: 07/30/2021 15:36   CT Head Wo Contrast  Result Date: 07/30/2021 CLINICAL DATA:  Delirium. Bilateral leg weakness. Altered mental status. Shunt. EXAM: CT HEAD WITHOUT CONTRAST TECHNIQUE: Contiguous axial images were obtained from the base of the skull through the vertex without intravenous contrast. COMPARISON:  11/09/2018 FINDINGS: Brain: Right frontal ventricular shunt catheter in the right lateral ventricle unchanged. Moderate ventricular enlargement most notably of the third and lateral ventricles. Lateral ventricle size slightly larger. Biventricular diameter now 74.5 mm compared with 73 mm previously. Mild periventricular white matter hypodensity. Negative for acute infarct, hemorrhage, mass Vascular: Negative for hyperdense vessel Skull: No acute abnormality Sinuses/Orbits: Paranasal sinuses clear. Bilateral cataract extraction Other: None IMPRESSION: Right frontal shunt catheter unchanged. Slight progression of ventricle enlargement. No acute abnormality. Electronically Signed   By: Franchot Gallo M.D.  On: 07/30/2021 14:18   CARDIAC CATHETERIZATION  Result Date: 07/31/2021 1. Severe MR with elevated filling pressures. 2. IABP inserted successfully.  Start heparin gtt for IABP.   DG CHEST PORT 1 VIEW  Result Date: 07/31/2021 CLINICAL DATA:  Central line placement. EXAM: PORTABLE CHEST 1 VIEW COMPARISON:  Same day. FINDINGS: Stable cardiomediastinal silhouette. Interval placement of left internal jugular catheter with distal tip in expected position of the SVC. Endotracheal and nasogastric tubes are in good position. Stable position of right internal jugular catheter with distal tip in expected position of cavoatrial junction. No pneumothorax is noted. Right upper lobe opacity is noted concerning for pneumonia. Left basilar atelectasis is noted with  probable effusion. IMPRESSION: Interval placement of left internal jugular catheter with distal tip in expected position of the SVC. Endotracheal and nasogastric tubes are in good position. Stable position of right internal jugular catheter. Stable bilateral lung opacities as described above. Electronically Signed   By: Marijo Conception M.D.   On: 07/31/2021 17:33   DG CHEST PORT 1 VIEW  Result Date: 07/31/2021 CLINICAL DATA:  80 year old female with respiratory failure. EXAM: PORTABLE CHEST 1 VIEW COMPARISON:  Portable chest 07/30/2021 and earlier. FINDINGS: Portable AP semi upright view at 912 hours. Stable lung volumes and mediastinal contours. Increasing left lung base opacity, now partially obscuring the left hemidiaphragm. Right chest shunt catheter redemonstrated. No pneumothorax. No pulmonary edema or definite effusion. Partially visible cervical ACDF. No acute osseous abnormality identified. IMPRESSION: 1. Increasing left lung base opacity since yesterday compatible with progressive atelectasis or infection. 2. No other acute cardiopulmonary abnormality identified. Electronically Signed   By: Genevie Ann M.D.   On: 07/31/2021 09:34   DG Chest Port 1 View  Result Date: 07/30/2021 CLINICAL DATA:  Possible sepsis.  Leg weakness. EXAM: PORTABLE CHEST 1 VIEW COMPARISON:  07/20/2017. FINDINGS: Trachea is midline. Heart size stable. Thoracic aorta is calcified. Shunt catheter projects over the midline chest. Biapical pleural thickening. Bibasilar subsegmental volume loss. Probable small left pleural effusion. IMPRESSION: 1. Probable small left pleural effusion. 2. Bibasilar atelectasis. Electronically Signed   By: Lorin Picket M.D.   On: 07/30/2021 14:04   DG Abd Portable 1V  Result Date: 07/31/2021 CLINICAL DATA:  Orogastric tube placement. EXAM: PORTABLE ABDOMEN - 1 VIEW COMPARISON:  None. FINDINGS: Distal tip of nasogastric tube appears to be in expected position of distal stomach. IMPRESSION:  Distal tip of nasogastric tube appears to be in expected position of distal stomach. Electronically Signed   By: Marijo Conception M.D.   On: 07/31/2021 17:31   ECHOCARDIOGRAM COMPLETE  Result Date: 07/31/2021    ECHOCARDIOGRAM REPORT   Patient Name:   [name] Date of Exam: 07/31/2021 Medical Rec #:  [id number]              Height:       67.0 in Accession #:    [id number]             Weight:       167.1 lb Date of Birth:  [birth year]               BSA:          1.874 m Patient Age:    24 years               BP:           108/61 mmHg Patient Gender: F  HR:           96 bpm. Exam Location:  Inpatient Procedure: 2D Echo, Cardiac Doppler and Color Doppler Indications:     Chest pain  History:         Patient has no prior history of Echocardiogram examinations.                  Risk Factors:Hypertension.  Sonographer:     Alvera Novel Referring Phys:  [id number] [address] Nine Diagnosing Phys: Wilfred Lacy IMPRESSIONS  1. Left ventricular ejection fraction, by estimation, is 60 to 65%. The left ventricle has normal function. The left ventricle demonstrates regional wall motion abnormalities with mid inferolateral hypokinesis. Left ventricular diastolic parameters are consistent with Grade II diastolic dysfunction (pseudonormalization).  2. Right ventricular systolic function is normal. The right ventricular size is normal. There is severely elevated pulmonary artery systolic pressure. The estimated right ventricular systolic pressure is 63.7 mmHg.  3. Left atrial size was severely dilated.  4. Right atrial size was mildly dilated.  5. The mitral valve is abnormal. Severe mitral valve regurgitation. MR is highly eccentric and posteriorly directed. In the parasternal long axis view, there is a mobile structure associated with the anterior leaflet. Query ruptured papillary muscle versus partial flail leaflet. There is splay artifact noted. PISA ERO 0.39 cm^2 (not likely to be completely  accurate as off axis). No evidence of mitral stenosis.  6. The aortic valve is tricuspid. Aortic valve regurgitation is not visualized. No aortic stenosis is present.  7. The inferior vena cava is dilated in size with <50% respiratory variability, suggesting right atrial pressure of 15 mmHg.  8. I worry about ruptured papillary muscle with acute severe MR given inferolateral wall motion abnormality. Needs TEE. FINDINGS  Left Ventricle: Left ventricular ejection fraction, by estimation, is 60 to 65%. The left ventricle has normal function. The left ventricle demonstrates regional wall motion abnormalities. The left ventricular internal cavity size was normal in size. There is no left ventricular hypertrophy. Left ventricular diastolic parameters are consistent with Grade II diastolic dysfunction (pseudonormalization). Right Ventricle: The right ventricular size is normal. No increase in right ventricular wall thickness. Right ventricular systolic function is normal. There is severely elevated pulmonary artery systolic pressure. The tricuspid regurgitant velocity is 3.49 m/s, and with an assumed right atrial pressure of 15 mmHg, the estimated right ventricular systolic pressure is 63.7 mmHg. Left Atrium: Left atrial size was severely dilated. Right Atrium: Right atrial size was mildly dilated. Pericardium: There is no evidence of pericardial effusion. Mitral Valve: The mitral valve is abnormal. Severe mitral valve regurgitation. No evidence of mitral valve stenosis. Tricuspid Valve: The tricuspid valve is normal in structure. Tricuspid valve regurgitation is mild. Aortic Valve: The aortic valve is tricuspid. Aortic valve regurgitation is not visualized. No aortic stenosis is present. Pulmonic Valve: The pulmonic valve was normal in structure. Pulmonic valve regurgitation is not visualized. Aorta: The aortic root is normal in size and structure. Venous: The inferior vena cava is dilated in size with less than 50%  respiratory variability, suggesting right atrial pressure of 15 mmHg. IAS/Shunts: No atrial level shunt detected by color flow Doppler.  LEFT VENTRICLE PLAX 2D LVIDd:         4.80 cm     Diastology LVIDs:         3.30 cm     LV e' lateral: 9.25 cm/s LV PW:         0.80 cm LV IVS:  0.80 cm LVOT diam:     2.10 cm LV SV:         68 LV SV Index:   36 LVOT Area:     3.46 cm  LV Volumes (MOD) LV vol d, MOD A2C: 51.8 ml LV vol d, MOD A4C: 60.2 ml LV vol s, MOD A2C: 16.0 ml LV vol s, MOD A4C: 26.1 ml LV SV MOD A2C:     35.8 ml LV SV MOD A4C:     60.2 ml LV SV MOD BP:      35.9 ml RIGHT VENTRICLE             IVC RV S prime:     21.40 cm/s  IVC diam: 2.50 cm TAPSE (M-mode): 2.1 cm LEFT ATRIUM              Index        RIGHT ATRIUM           Index LA diam:        4.10 cm  2.19 cm/m   RA Area:     18.20 cm LA Vol (A2C):   95.7 ml  51.08 ml/m  RA Volume:   50.20 ml  26.79 ml/m LA Vol (A4C):   126.0 ml 67.25 ml/m LA Biplane Vol: 108.0 ml 57.64 ml/m  AORTIC VALVE LVOT Vmax:   120.00 cm/s LVOT Vmean:  67.500 cm/s LVOT VTI:    0.195 m  AORTA Ao Root diam: 3.40 cm Ao Asc diam:  3.30 cm MR Peak grad:    64.0 mmHg    TRICUSPID VALVE MR Mean grad:    41.0 mmHg    TR Peak grad:   48.7 mmHg MR Vmax:         400.00 cm/s  TR Vmax:        349.00 cm/s MR Vmean:        300.0 cm/s MR PISA:         5.09 cm     SHUNTS MR PISA Eff ROA: 39 mm       Systemic VTI:  0.20 m MR PISA Radius:  0.90 cm      Systemic Diam: 2.10 cm Dalton McleanMD Electronically signed by Franki Monte Signature Date/Time: 07/31/2021/10:52:39 AM    Final (Updated)    ECHO TEE  Result Date: 07/31/2021    TRANSESOPHOGEAL ECHO REPORT   Patient Name:   [name] Date of Exam: 07/31/2021 Medical Rec #:  [id number]              Height:       67.0 in Accession #:    [id number]             Weight:       171.3 lb Date of Birth:  February 09, 1941               BSA:          1.893 m Patient Age:    21 years               BP:           125/67 mmHg Patient  Gender: F                      HR:           100 bpm. Exam Location:  Inpatient Procedure: Transesophageal Echo, Cardiac Doppler, Color Doppler and 3D Echo Indications:     Mitral regurgitation  History:  Patient has prior history of Echocardiogram examinations, most                  recent 07/31/2021. Signs/Symptoms:Shortness of Breath; Risk                  Factors:Hypertension and Dyslipidemia.  Sonographer:     Clayton Lefort RDCS (AE) Referring Phys:  Adrian Diagnosing Phys: Kirk Ruths McleanMD PROCEDURE: After discussion of the risks and benefits of a TEE, an informed consent was obtained from a family member. The transesophogeal probe was passed without difficulty through the esophogus of the patient. Sedation performed by performing physician. Patients was under conscious sedation during this procedure. Anesthetic administered: 358mcg of Fentanyl, 2.$RemoveBefore'0mg'RotEuCbAveevw$  of Versed. Image quality was good. The patient developed no complications during the procedure. IMPRESSIONS  1. Left ventricular ejection fraction, by estimation, is 55%. The left ventricle has normal function. The left ventricle demonstrates regional wall motion abnormalities with hypokinesis of the mid lateral wall.  2. Right ventricular systolic function is normal. The right ventricular size is normal.  3. The mitral valve is abnormal. Severe mitral valve regurgitation. There was prolapse/partial flail of the A2 segment as well as posterior leaflet restriction likely from the lateral wall hypokinesis. Suspect combination of infarct-related MR and MR due to partial flail A2 with eccentric posteriorly-directed jet. I was worried about rupture of papillary muscle but no definite evidence for this. The dilated left atrium suggests that there was some pre-existing mitral regurgitation that may have become acutely worse with MI involving inferolateral wall. No evidence of mitral stenosis. The mean mitral valve gradient is 3.0 mmHg.  4. Peak RV-RA  gradient 26 mmHg.  5. The aortic valve is tricuspid. Aortic valve regurgitation is not visualized. No aortic stenosis is present.  6. No ASD or PFO by color doppler.  7. Left atrial size was moderately dilated. No left atrial/left atrial appendage thrombus was detected. FINDINGS  Left Ventricle: Left ventricular ejection fraction, by estimation, is 55%. The left ventricle has normal function. The left ventricle demonstrates regional wall motion abnormalities. The left ventricular internal cavity size was small. There is no left ventricular hypertrophy. Right Ventricle: The right ventricular size is normal. No increase in right ventricular wall thickness. Right ventricular systolic function is normal. Left Atrium: Left atrial size was moderately dilated. No left atrial/left atrial appendage thrombus was detected. Right Atrium: Right atrial size was normal in size. Pericardium: There is no evidence of pericardial effusion. Mitral Valve: The mitral valve is abnormal. Severe mitral valve regurgitation. No evidence of mitral valve stenosis. MV peak gradient, 5.7 mmHg. The mean mitral valve gradient is 3.0 mmHg. Tricuspid Valve: Peak RV-RA gradient 26 mmHg. The tricuspid valve is normal in structure. Tricuspid valve regurgitation is mild. Aortic Valve: The aortic valve is tricuspid. Aortic valve regurgitation is not visualized. No aortic stenosis is present. Pulmonic Valve: The pulmonic valve was normal in structure. Pulmonic valve regurgitation is trivial. Aorta: The aortic root is normal in size and structure. IAS/Shunts: No ASD or PFO by color doppler.  MITRAL VALVE            TRICUSPID VALVE MV Peak grad: 5.7 mmHg  TR Peak grad:   25.8 mmHg MV Mean grad: 3.0 mmHg  TR Vmax:        254.00 cm/s MV Vmax:      1.19 m/s MV Vmean:     77.4 cm/s Dalton McleanMD Electronically signed by Franki Monte Signature Date/Time: 07/31/2021/5:55:01 PM  Final (Updated)     STS Risk Calculator:  Procedure: MVR + CAB Risk of  Mortality: 28.228% Renal Failure: 11.200% Permanent Stroke: 2.948% Prolonged Ventilation: 43.565% DSW Infection: 0.242% Reoperation: 10.012% Morbidity or Mortality: 54.609% Short Length of Stay: 5.095% Long Length of Stay: 24.933%  Procedure: MV Repair + CAB Risk of Mortality: 17.172% Renal Failure: 4.533% Permanent Stroke: 10.823% Prolonged Ventilation: 34.330% DSW Infection: 0.058% Reoperation: 8.454% Morbidity or Mortality: 48.158% Short Length of Stay: 7.239% Long Length of Stay: 25.106%  __________________________   [facility] Cardiomyopathy Questionnaire  KCCQ-12 08/01/2021  1 a. Ability to shower/bathe Not at all limited  1 b. Ability to walk 1 block Extremely limited  1 c. Ability to hurry/jog Other, Did not do  2. Edema feet/ankles/legs Never over the past 2 weeks  3. Limited by fatigue All of the time  4. Limited by dyspnea At least once a day  5. Sitting up / on 3+ pillows Never over the past 2 weeks  6. Limited enjoyment of life Limited quite a bit  7. Rest of life w/ symptoms Mostly dissatisfied  8 a. Participation in hobbies Limited quite a bit  8 b. Participation in chores Limited quite a bit  8 c. Visiting family/friends Limited quite a bit     Assessment and Plan:   [name] is a 80 y.o. 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 I have reviewed the patient's recent echocardiogram which is notable for EF 60 to 65%, mild inferolateral hypokinesis normal RV, severely dilated left atrium, and severe mitral regurgitation with a possible A2 partial flail leaflet.  Her MR was highly eccentric and posteriorly directed.   Given acute hypoxemic respiratory failure on BiPAP and need for TEE, the decision was made for  intubation.  Additionally, Dr. Aundra [name] placed an IABP and Swan catheter.  Right heart catheterization showed elevated filling pressures with a high PCWP.  Surprisingly, she did not have significant V waves. LHC deferred given AKI.   TEE showed severe, eccentric posteriorly-directed mitral regurgitation. There was prolapse/partial flail of the A2 segment of the anterior leaflet.  There was also restriction of the posterior leaflet.  Given severe left atrial dilation, it was suspected that she had had some degree of MR prior to this acute event.  It was suspect that there was pre-existing prolapse/partial flail of A2 and patient had NSTEMI involving LCx territory, leading to lateral wall motion abnormality and restriction of the posterior leaflet with acute worsening of the mitral regurgitation and eventually causing cardiogenic shock. TEE did not show definite evidence for papillary muscle rupture.   Blood pressure and renal function improved after balloon pump insertion.  Creatinine down to 3.4 (peaked at 4.42).  Lactic acid downtrending from 2.6-->1.2.  I have reviewed the natural history of mitral regurgitation with the patient. We have discussed the limitations of medical therapy and the poor prognosis associated with symptomatic mitral regurgitation. We have also reviewed potential treatment options, including palliative medical therapy, conventional surgical mitral valve repair or replacement, and percutaneous mitral valve repair with MitraClip. We discussed treatment options in the context of this patient's specific comorbid medical conditions.    The patient's predicted risk of mortality with conventional mitral valve replacement/repair is 28.2 % / 17.1 % respectively,  primarily based on age, cardiogenic shock, NSTEMI with suspected CAD,  chronic immunosuppression. Other significant comorbid conditions physical deconditioning.    She is tentatively scheduled for mitral valve transcatheter  edge-to-edge repair with Structural Heart Team on Monday 08/04/21.  Plan to do this procedure with the balloon pump in and then wean her off after clip in place. PCCM considering extubation today.  Continue to monitor status over the weekend.  Hopefully, renal function will continue to improve.  Dr. Ali Lowe to follow.   Signed, Angelena Form, PA-C  08/01/2021 9:27 AM  ATTENDING ATTESTATION:  After conducting a review of all available clinical information with the care team, interviewing the patient, and performing a physical exam, I agree with the findings and plan described in this note.  Patient here with acute ischemic mitral regurgitation and cardiogenic shock requiring IABP due to subacute inferolateral infarction.  Cr improving with good UOP.  MAP reasonable and on Fio2 40% without evidence of infection at this point.  Will obtain CT surgical consult but given clinical scenario with advanced age and AKI, likely best served by transcatheter edge to edge mitral repair after multidisciplinary review with advanced HF and imaging teams.  Tentatively planning for procedure Monday AM.  I discussed the pathophysiology of acute mitral regurgitation, its prognosis, and potential treatment strategies with the family over a 75 min meeting.

## 2021-08-01 NOTE — Progress Notes (Addendum)
Advanced Heart Failure Rounding Note  PCP-Cardiologist: None   Subjective:   07/31/21 Shock . IABP/swan  placed. Intubated.   Clear urine, creatinine 3.8 => 3.4.   10/20 Initial Swan #s  RA 11 PA 40/20 (35) PCWP 27 PA sat 78% PVR 1.5  Fick CO 7.7.CI 4.1   Todays  Swan #s IABP at 1:1  RA 6 PA 33/17 (23) PCWP 15-16 PAPi 2.7  SVR 821 CO 8 CI 4.2   Awake on vent.    Objective:   Weight Range: 70.2 kg Body mass index is 24.24 kg/m.   Vital Signs:   Temp:  [99 F (37.2 C)-99.7 F (37.6 C)] 99 F (37.2 C) (10/21 0600) Pulse Rate:  [79-114] 90 (10/21 0600) Resp:  [11-98] 19 (10/21 0600) BP: (92-134)/(58-75) 110/67 (10/21 0600) SpO2:  [92 %-100 %] 100 % (10/21 0600) FiO2 (%):  [40 %-100 %] 60 % (10/21 0400) Weight:  [70.2 kg-77.7 kg] 70.2 kg (10/21 0353) Last BM Date: 07/30/21  Weight change: Filed Weights   07/31/21 0100 07/31/21 1048 08/01/21 0353  Weight: 75.8 kg 77.7 kg 70.2 kg    Intake/Output:   Intake/Output Summary (Last 24 hours) at 08/01/2021 0653 Last data filed at 08/01/2021 0600 Gross per 24 hour  Intake 894.02 ml  Output 520 ml  Net 374.02 ml      Physical Exam    General:  Intubated/awake  HEENT: ETT Neck: Supple. JVP does not appear elevated.  Carotids 2+ bilat; no bruits. No lymphadenopathy or thyromegaly appreciated.Marland Kitchen RIJ swan LIJ CVC  Cor: PMI nondisplaced. Regular rate & rhythm. No rubs, gallops. 3/6 MR . Lungs: Clear Abdomen: Soft, nontender, nondistended. No hepatosplenomegaly. No bruits or masses. Good bowel sounds. Extremities: No cyanosis, clubbing, rash, edema. R IABP.  R and L pedal pulse 2+  Neuro: Awake on vent follows commands. MAEx4 GU: Foley   Telemetry   SR 90s personally reviewed.   EKG    N/A   Labs    CBC Recent Labs    07/30/21 1351 07/31/21 0453 07/31/21 1556 07/31/21 1843 08/01/21 0339  WBC 20.4* 17.9*  --   --  15.3*  NEUTROABS 12.8*  --   --   --   --   HGB 12.2 10.9*   < > 11.2*  9.8*  HCT 35.7* 33.7*   < > 33.0* 31.1*  MCV 93.0 96.6  --   --  100.3*  PLT 212 153  --   --  130*   < > = values in this interval not displayed.   Basic Metabolic Panel Recent Labs    68/81/57 0453 07/31/21 1556 07/31/21 1843 08/01/21 0339  NA 135   < > 138 139  K 4.2   < > 4.3 4.4  CL 104  --   --  107  CO2 18*  --   --  19*  GLUCOSE 133*  --   --  105*  BUN 60*  --   --  72*  CREATININE 3.81*  --   --  3.40*  CALCIUM 8.8*  --   --  8.3*   < > = values in this interval not displayed.   Liver Function Tests Recent Labs    07/31/21 0453 08/01/21 0339  AST 79* 51*  ALT 30 26  ALKPHOS 51 48  BILITOT 1.0 0.8  PROT 6.3* 5.2*  ALBUMIN 3.6 2.7*   No results for input(s): LIPASE, AMYLASE in the last 72 hours. Cardiac Enzymes No  results for input(s): CKTOTAL, CKMB, CKMBINDEX, TROPONINI in the last 72 hours.  BNP: BNP (last 3 results) Recent Labs    07/30/21 1945  BNP 2,001.3*    ProBNP (last 3 results) No results for input(s): PROBNP in the last 8760 hours.   D-Dimer No results for input(s): DDIMER in the last 72 hours. Hemoglobin A1C Recent Labs    08/01/21 0339  HGBA1C 5.2   Fasting Lipid Panel Recent Labs    08/01/21 0339  TRIG 145   Thyroid Function Tests Recent Labs    07/30/21 1450  TSH 1.050    Other results:   Imaging    CARDIAC CATHETERIZATION  Result Date: 07/31/2021 1. Severe MR with elevated filling pressures. 2. IABP inserted successfully.  Start heparin gtt for IABP.   DG CHEST PORT 1 VIEW  Result Date: 07/31/2021 CLINICAL DATA:  Central line placement. EXAM: PORTABLE CHEST 1 VIEW COMPARISON:  Same day. FINDINGS: Stable cardiomediastinal silhouette. Interval placement of left internal jugular catheter with distal tip in expected position of the SVC. Endotracheal and nasogastric tubes are in good position. Stable position of right internal jugular catheter with distal tip in expected position of cavoatrial junction. No  pneumothorax is noted. Right upper lobe opacity is noted concerning for pneumonia. Left basilar atelectasis is noted with probable effusion. IMPRESSION: Interval placement of left internal jugular catheter with distal tip in expected position of the SVC. Endotracheal and nasogastric tubes are in good position. Stable position of right internal jugular catheter. Stable bilateral lung opacities as described above. Electronically Signed   By: Marijo Conception M.D.   On: 07/31/2021 17:33   DG CHEST PORT 1 VIEW  Result Date: 07/31/2021 CLINICAL DATA:  80 year old female with respiratory failure. EXAM: PORTABLE CHEST 1 VIEW COMPARISON:  Portable chest 07/30/2021 and earlier. FINDINGS: Portable AP semi upright view at 912 hours. Stable lung volumes and mediastinal contours. Increasing left lung base opacity, now partially obscuring the left hemidiaphragm. Right chest shunt catheter redemonstrated. No pneumothorax. No pulmonary edema or definite effusion. Partially visible cervical ACDF. No acute osseous abnormality identified. IMPRESSION: 1. Increasing left lung base opacity since yesterday compatible with progressive atelectasis or infection. 2. No other acute cardiopulmonary abnormality identified. Electronically Signed   By: Genevie Ann M.D.   On: 07/31/2021 09:34   DG Abd Portable 1V  Result Date: 07/31/2021 CLINICAL DATA:  Orogastric tube placement. EXAM: PORTABLE ABDOMEN - 1 VIEW COMPARISON:  None. FINDINGS: Distal tip of nasogastric tube appears to be in expected position of distal stomach. IMPRESSION: Distal tip of nasogastric tube appears to be in expected position of distal stomach. Electronically Signed   By: Marijo Conception M.D.   On: 07/31/2021 17:31   ECHOCARDIOGRAM COMPLETE  Result Date: 07/31/2021    ECHOCARDIOGRAM REPORT   Patient Name:   LYSHA SCHRADE Date of Exam: 07/31/2021 Medical Rec #:  [id number]              Height:       67.0 in Accession #:    [id number]             Weight:        167.1 lb Date of Birth:  10-20-1940               BSA:          1.874 m Patient Age:    80 years               BP:  108/61 mmHg Patient Gender: F                      HR:           96 bpm. Exam Location:  Inpatient Procedure: 2D Echo, Cardiac Doppler and Color Doppler Indications:     Chest pain  History:         Patient has no prior history of Echocardiogram examinations.                  Risk Factors:Hypertension.  Sonographer:     Helmut Muster Referring Phys:  [place] Diagnosing Phys: Franki Monte IMPRESSIONS  1. Left ventricular ejection fraction, by estimation, is 60 to 65%. The left ventricle has normal function. The left ventricle demonstrates regional wall motion abnormalities with mid inferolateral hypokinesis. Left ventricular diastolic parameters are consistent with Grade II diastolic dysfunction (pseudonormalization).  2. Right ventricular systolic function is normal. The right ventricular size is normal. There is severely elevated pulmonary artery systolic pressure. The estimated right ventricular systolic pressure is 35.0 mmHg.  3. Left atrial size was severely dilated.  4. Right atrial size was mildly dilated.  5. The mitral valve is abnormal. Severe mitral valve regurgitation. MR is highly eccentric and posteriorly directed. In the parasternal long axis view, there is a mobile structure associated with the anterior leaflet. Query ruptured papillary muscle versus partial flail leaflet. There is splay artifact noted. PISA ERO 0.39 cm^2 (not likely to be completely accurate as off axis). No evidence of mitral stenosis.  6. The aortic valve is tricuspid. Aortic valve regurgitation is not visualized. No aortic stenosis is present.  7. The inferior vena cava is dilated in size with <50% respiratory variability, suggesting right atrial pressure of 15 mmHg.  8. I worry about ruptured papillary muscle with acute severe MR given inferolateral wall motion abnormality. Needs TEE.  FINDINGS  Left Ventricle: Left ventricular ejection fraction, by estimation, is 60 to 65%. The left ventricle has normal function. The left ventricle demonstrates regional wall motion abnormalities. The left ventricular internal cavity size was normal in size. There is no left ventricular hypertrophy. Left ventricular diastolic parameters are consistent with Grade II diastolic dysfunction (pseudonormalization). Right Ventricle: The right ventricular size is normal. No increase in right ventricular wall thickness. Right ventricular systolic function is normal. There is severely elevated pulmonary artery systolic pressure. The tricuspid regurgitant velocity is 3.49 m/s, and with an assumed right atrial pressure of 15 mmHg, the estimated right ventricular systolic pressure is 09.3 mmHg. Left Atrium: Left atrial size was severely dilated. Right Atrium: Right atrial size was mildly dilated. Pericardium: There is no evidence of pericardial effusion. Mitral Valve: The mitral valve is abnormal. Severe mitral valve regurgitation. No evidence of mitral valve stenosis. Tricuspid Valve: The tricuspid valve is normal in structure. Tricuspid valve regurgitation is mild. Aortic Valve: The aortic valve is tricuspid. Aortic valve regurgitation is not visualized. No aortic stenosis is present. Pulmonic Valve: The pulmonic valve was normal in structure. Pulmonic valve regurgitation is not visualized. Aorta: The aortic root is normal in size and structure. Venous: The inferior vena cava is dilated in size with less than 50% respiratory variability, suggesting right atrial pressure of 15 mmHg. IAS/Shunts: No atrial level shunt detected by color flow Doppler.  LEFT VENTRICLE PLAX 2D LVIDd:         4.80 cm     Diastology LVIDs:         3.30  cm     LV e' lateral: 9.25 cm/s LV PW:         0.80 cm LV IVS:        0.80 cm LVOT diam:     2.10 cm LV SV:         68 LV SV Index:   36 LVOT Area:     3.46 cm  LV Volumes (MOD) LV vol d, MOD A2C:  51.8 ml LV vol d, MOD A4C: 60.2 ml LV vol s, MOD A2C: 16.0 ml LV vol s, MOD A4C: 26.1 ml LV SV MOD A2C:     35.8 ml LV SV MOD A4C:     60.2 ml LV SV MOD BP:      35.9 ml RIGHT VENTRICLE             IVC RV S prime:     21.40 cm/s  IVC diam: 2.50 cm TAPSE (M-mode): 2.1 cm LEFT ATRIUM              Index        RIGHT ATRIUM           Index LA diam:        4.10 cm  2.19 cm/m   RA Area:     18.20 cm LA Vol (A2C):   95.7 ml  51.08 ml/m  RA Volume:   50.20 ml  26.79 ml/m LA Vol (A4C):   126.0 ml 67.25 ml/m LA Biplane Vol: 108.0 ml 57.64 ml/m  AORTIC VALVE LVOT Vmax:   120.00 cm/s LVOT Vmean:  67.500 cm/s LVOT VTI:    0.195 m  AORTA Ao Root diam: 3.40 cm Ao Asc diam:  3.30 cm MR Peak grad:    64.0 mmHg    TRICUSPID VALVE MR Mean grad:    41.0 mmHg    TR Peak grad:   48.7 mmHg MR Vmax:         400.00 cm/s  TR Vmax:        349.00 cm/s MR Vmean:        300.0 cm/s MR PISA:         5.09 cm     SHUNTS MR PISA Eff ROA: 39 mm       Systemic VTI:  0.20 m MR PISA Radius:  0.90 cm      Systemic Diam: 2.10 cm Jayden Kratochvil McleanMD Electronically signed by Franki Monte Signature Date/Time: 07/31/2021/10:52:39 AM    Final (Updated)    ECHO TEE  Result Date: 07/31/2021    TRANSESOPHOGEAL ECHO REPORT   Patient Name:   SKYLEEN BENTLEY Date of Exam: 07/31/2021 Medical Rec #:  [id number]              Height:       67.0 in Accession #:    [id number]             Weight:       171.3 lb Date of Birth:  [birth year]               BSA:          1.893 m Patient Age:    38 years               BP:           125/67 mmHg Patient Gender: F                      HR:  100 bpm. Exam Location:  Inpatient Procedure: Transesophageal Echo, Cardiac Doppler, Color Doppler and 3D Echo Indications:     Mitral regurgitation  History:         Patient has prior history of Echocardiogram examinations, most                  recent 07/31/2021. Signs/Symptoms:Shortness of Breath; Risk                  Factors:Hypertension and Dyslipidemia.   Sonographer:     Clayton Lefort RDCS (AE) Referring Phys:  [place] Diagnosing Phys: Kirk Ruths McleanMD PROCEDURE: After discussion of the risks and benefits of a TEE, an informed consent was obtained from a family member. The transesophogeal probe was passed without difficulty through the esophogus of the patient. Sedation performed by performing physician. Patients was under conscious sedation during this procedure. Anesthetic administered: 332mcg of Fentanyl, 2.$RemoveBefore'0mg'[id number]$  of Versed. Image quality was good. The patient developed no complications during the procedure. IMPRESSIONS  1. Left ventricular ejection fraction, by estimation, is 55%. The left ventricle has normal function. The left ventricle demonstrates regional wall motion abnormalities with hypokinesis of the mid lateral wall.  2. Right ventricular systolic function is normal. The right ventricular size is normal.  3. The mitral valve is abnormal. Severe mitral valve regurgitation. There was prolapse/partial flail of the A2 segment as well as posterior leaflet restriction likely from the lateral wall hypokinesis. Suspect combination of infarct-related MR and MR due to partial flail A2 with eccentric posteriorly-directed jet. I was worried about rupture of papillary muscle but no definite evidence for this. The dilated left atrium suggests that there was some pre-existing mitral regurgitation that may have become acutely worse with MI involving inferolateral wall. No evidence of mitral stenosis. The mean mitral valve gradient is 3.0 mmHg.  4. Peak RV-RA gradient 26 mmHg.  5. The aortic valve is tricuspid. Aortic valve regurgitation is not visualized. No aortic stenosis is present.  6. No ASD or PFO by color doppler.  7. Left atrial size was moderately dilated. No left atrial/left atrial appendage thrombus was detected. FINDINGS  Left Ventricle: Left ventricular ejection fraction, by estimation, is 55%. The left ventricle has normal function. The left  ventricle demonstrates regional wall motion abnormalities. The left ventricular internal cavity size was small. There is no left ventricular hypertrophy. Right Ventricle: The right ventricular size is normal. No increase in right ventricular wall thickness. Right ventricular systolic function is normal. Left Atrium: Left atrial size was moderately dilated. No left atrial/left atrial appendage thrombus was detected. Right Atrium: Right atrial size was normal in size. Pericardium: There is no evidence of pericardial effusion. Mitral Valve: The mitral valve is abnormal. Severe mitral valve regurgitation. No evidence of mitral valve stenosis. MV peak gradient, 5.7 mmHg. The mean mitral valve gradient is 3.0 mmHg. Tricuspid Valve: Peak RV-RA gradient 26 mmHg. The tricuspid valve is normal in structure. Tricuspid valve regurgitation is mild. Aortic Valve: The aortic valve is tricuspid. Aortic valve regurgitation is not visualized. No aortic stenosis is present. Pulmonic Valve: The pulmonic valve was normal in structure. Pulmonic valve regurgitation is trivial. Aorta: The aortic root is normal in size and structure. IAS/Shunts: No ASD or PFO by color doppler.  MITRAL VALVE            TRICUSPID VALVE MV Peak grad: 5.7 mmHg  TR Peak grad:   25.8 mmHg MV Mean grad: 3.0 mmHg  TR Vmax:  254.00 cm/s MV Vmax:      1.19 m/s MV Vmean:     77.4 cm/s Rock Sobol McleanMD Electronically signed by Franki Monte Signature Date/Time: 07/31/2021/5:55:01 PM    Final (Updated)      Medications:     Scheduled Medications:  albuterol  2.5 mg Nebulization Q6H   aspirin  81 mg Per Tube Daily   atorvastatin  80 mg Per Tube Daily   chlorhexidine gluconate (MEDLINE KIT)  15 mL Mouth Rinse BID   chlorhexidine gluconate (MEDLINE KIT)  15 mL Mouth Rinse BID   Chlorhexidine Gluconate Cloth  6 each Topical Daily   docusate  100 mg Per Tube BID   fentaNYL (SUBLIMAZE) injection  25 mcg Intravenous Once   insulin aspart  1-3 Units  Subcutaneous Q4H   mouth rinse  15 mL Mouth Rinse 10 times per day   mouth rinse  15 mL Mouth Rinse 10 times per day   multivitamin with minerals  1 tablet Oral Daily   pantoprazole (PROTONIX) IV  40 mg Intravenous Daily   polyethylene glycol  17 g Per Tube Daily   predniSONE  2 mg Oral Daily   sodium chloride flush  3 mL Intravenous Q12H   vancomycin variable dose per unstable renal function (pharmacist dosing)   Does not apply See admin instructions    Infusions:  ceFEPime (MAXIPIME) IV Stopped (07/31/21 2210)   fentaNYL infusion INTRAVENOUS 150 mcg/hr (08/01/21 0600)   heparin 750 Units/hr (08/01/21 0600)    PRN Medications: acetaminophen **OR** acetaminophen, fentaNYL, midazolam, midazolam, polyethylene glycol    Patient Profile   80 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 PMH h/o normal pressure hydrocephalus s/p stent, HDL, RA and former smoker.   Shock --. Cardiogenic +/- septic. Severe MR. IABP palced   Assessment/Plan   1. Mitral regurgitation: TEE showed severe, eccentric posteriorly-directed mitral regurgitation.  There was prolapse/partial flail of the A2 segment of the anterior leaflet.  There was also restriction of the posterior leaflet.  The left atrium was moderate to severely dilated, so I think that there was some degree of MR prior to the acute event.  I suspect that there was pre-existing prolapse/partial flail of A2 and patient had NSTEMI involving LCx territory, leading to lateral wall motion abnormality and restriction of the posterior leaflet with acute worsening of the mitral regurgitation. TEE does not show definite evidence for papillary muscle rupture.  Suspect that the cause of her cardiogenic shock is acute worsening of mitral regurgitation.  - Per Dr Aundra Dubin not thought to be  candidate for cardiac surgery (age, severe AKI, prednisone prior to admission).  - She may be a candidate for Mitraclip.  Reviewed with Drs Burt Knack and Ali Lowe, would  be difficult procedure technically with small posterior leaflet.  - Continue to stabilize her renal function and hemodynamics with IABP and move towards Mitraclip Monday if we can do so.  2. AKI: Baseline creatinine < 1, 3.8 today.  Suspect this was due to cardiogenic shock with acute worsening of mitral regurgitation.   Follow trajectory of renal function with IABP.  She would be a poor dialysis candidate.  If renal function does not improve with IABP, will need to consider palliative care.  Renal function improving today. Creatinine trending down 3.8>3.4  3. Acute diastolic CHF/cardiogenic shock: In setting of suspected acute on chronic mitral regurgitation and NSTEMI.  Elevated filling pressures on RHC, especially high PCWP (though surprisingly, she did not have prominent v-waves).   -  Management with IABP for afterload reduction.  - Hemodynamics improved with IABP. Volume status improved.  CVP 6 PCWP 15 SVR 821 CO 8 - Continue  heparin gtt for IABP.  - Lactate 1.2  4. Rheumatoid arthritis: On prednisone 2 mg daily, Enbrel, and leflunomide at home. Need to consider risk of adrenal insufficiency off prednisone though she was on low dose.  Would ideally avoid stress dose steroids if possible.  5. Acute hypoxemic respiratory failure: Now intubated.  Pulmonary edema due to MR.   - CCM following.  6. ID: Afebrile, unlikely infectious etiology to presentation.  - For now, with critical illness, covering with vancomycin/cefepime. - Bld Cx NGTD.  - urine cx pending. WBC trending down.   7. CAD: Out of hospital NSTEMI with HS-TnI up to about 16,000.  Inferolateral wall motion abnormality suggests LCx territory MI.  Suspect that there is a component of acute infarct-related mitral regurgition.  - No cath at this time with AKI.  - ASA 81, statin.  - Heparin gtt as above.  9. Acute Hypoxemic Respiratory Failure Intubated 10/20. Hopefully can extubate later today. CCM following.  10. Thrombocytopenia:  Platelets 130 today, drop from yesterday. ?Related to IABP, just started heparin last night.   - Watch CBC closely.    Length of Stay: 2  Darrick Grinder, NP  08/01/2021, 6:53 AM  Advanced Heart Failure Team Pager [phone] (M-F; 7a - 5p)  Please contact [facility] for night-coverage after hours (5p -7a ) and weekends on [email or web address]  Patient seen with NP, agree with the above note.   Hemodynamics better today with IABP, she remains off pressors/inotropes.  CI 4.2 with PCWP down to 15 and RA 5.  She is awake on vent. Lactate normalized.   Of note, platelets lower at 130.   General: NAD Neck: No JVD, no thyromegaly or thyroid nodule.  Lungs: Clear to auscultation bilaterally with normal respiratory effort. CV: Nondisplaced PMI.  Heart regular S1/S2, no S3/S4, IABP sounds.  No peripheral edema.  Abdomen: Soft, nontender, no hepatosplenomegaly, no distention.  Skin: Intact without lesions or rashes.  Neurologic: Awake on vent.  Extremities: No clubbing or cyanosis.  HEENT: Normal.   Stable today on IABP 1:1.  Does not need inotrope/pressor.  Filling pressures improved.  Making some urine now (clear).  Creatinine trending lower, 3.4 today.  - Continue IABP with heparin gtt.   Platelets lower at 130 today.  Doubt HIT as just started heparin, may be related to IABP.  - Follow CBC closely.  - CXR today for IABP position  Discussed with Dr. Carlis Abbott, may be able to extubate today.   Tentative plan for Mitraclip on Monday.   CRITICAL CARE Performed by: Loralie Champagne  Total critical care time: 40 minutes  Critical care time was exclusive of separately billable procedures and treating other patients.  Critical care was necessary to treat or prevent imminent or life-threatening deterioration.  Critical care was time spent personally by me on the following activities: development of treatment plan with patient and/or surrogate as well as nursing, discussions with consultants, evaluation  of patient's response to treatment, examination of patient, obtaining history from patient or surrogate, ordering and performing treatments and interventions, ordering and review of laboratory studies, ordering and review of radiographic studies, pulse oximetry and re-evaluation of patient's condition.  Loralie Champagne 08/01/2021 7:48 AM

## 2021-08-01 NOTE — Consult Note (Addendum)
[place] VALVE TEAM  Inpatient [facility] Consultation:   Patient ID: Asenath Balash; [id number]; 04-May-1941   Admit date: 07/30/2021 Date of Consult: 08/01/2021  Primary Care Provider: Javier Glazier, MD Primary Cardiologist: Dr. Aundra Dubin Primary Electrophysiologist:  none   Patient Profile:   Sarai January is a 80 y.o. female with a hx of pressure hydrocephalus s/p stent, RA on Enbrel, leflunomide and chronic prednisone, HTN, HLD and former tobacco abuse who is being seen today for the evaluation of severe MR at the request of Dr. Ali Lowe.  History of Present Illness:   Ms. Alatorre lives at home with her husband.  She has chronic physical debilitation from longstanding rheumatoid arthritis.  She walks very slowly with a walker.  She is occasionally able to get out of the house to go to church or the store.  She has strong family support with her 2 sons who both live locally.  One of her sons, is Dr. Lajean Saver who works for our ER department.  They had been recently discussing getting nursing care help in the home due to her inability to take care of herself and her husband.  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  Per her son, [name], she had a mediocre quality of life and he is not sure she would be happy with further debilitation.  They are eager for extubation so that they can discuss her wishes with her personally.   Her family has noticed a gradual slowing down of their mother over the past several months.  They have noticed worsening in her stamina, breathing and fatigue.  She was in her usual state of health until approximately 4 days prior to admission when she developed progressive weakness and altered mental status. She was brought to [facility] ED on 07/30/21 for evaluation.     In the ER she reported shortness of breath & vague abdominal pain prior to presentation. Initial ER evaluation notable for SBP 80-100's and new O2 need of  2-6L. She was transitioned to BiPAP support after IVFs.  Labs showed Na 133, Bicarb 18, BUN 62, Cr 4.43 (baseline 0.7), AST 112, WBC 20.4, lactic acid 2.62, Hs trop 16,652>>16,254>>13,992>>14,937.  She was negative for COVID and influenza.  CXR showed small left pleural effusion, mild atelectasis on right & bibasilar atelectasis.  EKG showed ST changes thought to be non-specific.    2D echo showed EF 60 to 65%, mild inferolateral hypokinesis normal RV, severely dilated left atrium, and severe mitral regurgitation with a possible A2 partial flail leaflet.  Her MR was highly eccentric and posteriorly directed.  She was transferred to [facility] for management of her severe mitral regurgitation by the advanced heart failure team.   Given acute hypoxemic respiratory failure on BiPAP and need for TEE, the decision was made for intubation.  Additionally, Dr. Aundra Dubin placed an IABP and Swan catheter.  Right heart catheterization showed elevated filling pressures with a high PCWP.  Surprisingly, she did not have significant V waves. LHC deferred given AKI.    TEE showed severe, eccentric posteriorly-directed mitral regurgitation. There was prolapse/partial flail of the A2 segment of the anterior leaflet.  There was also restriction of the posterior leaflet.  Given severe left atrial dilation, it was suspected that she had had some degree of MR prior to this acute event.  It was suspected that there was pre-existing prolapse/partial flail of A2 and patient had NSTEMI involving LCx territory, leading to lateral  wall motion abnormality and restriction of the posterior leaflet with acute worsening of the mitral regurgitation and eventually causing cardiogenic shock. TEE did not show definite evidence for papillary muscle rupture.    Blood pressure and renal function improved after balloon pump insertion.  Creatinine down to 3.4 (peaked at 4.42).  Lactic acid downtrending from 2.6-->1.2.   Cardiothoracic  surgery consultation is requested for consideration of transcatheter mitral valve edge-to-edge repair.  Past Medical History:  Diagnosis Date   Cervical spondylosis    Cervicogenic headache    Right occipital neuralgia   Depression    Gait disorder    Hyperlipidemia    Hypertension    Normal pressure hydrocephalus (HCC)    Occipital neuralgia 04/17/2013   right   Osteomyelitis of left knee region [facility])    Rheumatoid arthritis(714.0)    Vertigo 02/19/2015    Past Surgical History:  Procedure Laterality Date   CERVICAL SPINE SURGERY     IABP INSERTION N/A 07/31/2021   Procedure: IABP INSERTION;  Surgeon: Larey Dresser, MD;  Location: [place] CV LAB;  Service: Cardiovascular;  Laterality: N/A;   JOINT REPLACEMENT     LUMBAR SPINE SURGERY     OVARY SURGERY     REPLACEMENT TOTAL KNEE Left    TOTAL KNEE ARTHROPLASTY Right 04/29/2020   Procedure: TOTAL KNEE ARTHROPLASTY;  Surgeon: [name], MD;  Location: [facility] ORS;  Service: Orthopedics;  Laterality: Right;  13min   VENTRICULO-PERITONEAL SHUNT PLACEMENT / LAPAROSCOPIC INSERTION PERITONEAL CATHETER       Inpatient Medications: Scheduled Meds:  albuterol  2.5 mg Nebulization Q6H   aspirin  81 mg Per Tube Daily   atorvastatin  80 mg Per Tube Daily   chlorhexidine gluconate (MEDLINE KIT)  15 mL Mouth Rinse BID   Chlorhexidine Gluconate Cloth  6 each Topical Daily   docusate  100 mg Per Tube BID   fentaNYL (SUBLIMAZE) injection  25 mcg Intravenous Once   insulin aspart  1-3 Units Subcutaneous Q4H   mouth rinse  15 mL Mouth Rinse 10 times per day   [START ON 08/02/2021] multivitamin with minerals  1 tablet Per Tube Daily   pantoprazole (PROTONIX) IV  40 mg Intravenous Daily   polyethylene glycol  17 g Per Tube Daily   [START ON 08/02/2021] predniSONE  2 mg Per Tube Daily   sodium chloride flush  3 mL Intravenous Q12H   vancomycin variable dose per unstable renal function (pharmacist dosing)   Does not apply See admin  instructions   Continuous Infusions:  ceFEPime (MAXIPIME) IV Stopped (07/31/21 2210)   fentaNYL infusion INTRAVENOUS Stopped (08/01/21 0942)   heparin 750 Units/hr (08/01/21 1300)   PRN Meds: acetaminophen **OR** acetaminophen, fentaNYL, midazolam, midazolam, polyethylene glycol  Allergies:    Allergies  Allergen Reactions   Methotrexate Derivatives Other (See Comments)    Dangerously decreased platelet count   Penicillins Itching    Tolerated Cephalosporin Date: 04/30/20.     Sulfa Antibiotics Itching    Social History:   Social History   Socioeconomic History   Marital status: Married    Spouse name: Not on file   Number of children: 2   Years of education: 16   Highest education level: Not on file  Occupational History   Occupation: Retired    Fish farm manager: RETIRED  Tobacco Use   Smoking status: Former   Smokeless tobacco: Never  Scientific laboratory technician Use: Never used  Substance and Sexual Activity   Alcohol use:  Not Currently   Drug use: No   Sexual activity: Not on file  Other Topics Concern   Not on file  Social History Narrative   Patient lives at home with her husband.    Patient has 2 children.    Patient has 12 + years of school.    Patient is left handed.   Patient drinks about 2 cups of caffeine daily.   Social Determinants of Health   Financial Resource Strain: Not on file  Food Insecurity: Not on file  Transportation Needs: Not on file  Physical Activity: Not on file  Stress: Not on file  Social Connections: Not on file  Intimate Partner Violence: Not on file    Family History:   The patient's family history includes Brain cancer in her sister; Heart disease in her father; Ovarian cancer in her mother.  ROS:  Please see the history of present illness.  ROS  All other ROS reviewed and negative.     Physical Exam/Data:   Vitals:   08/01/21 1258 08/01/21 1259 08/01/21 1300 08/01/21 1301  BP:   120/81   Pulse: (!) 103 (!) 105 (!) 103 (!)  105  Resp: 18 (!) _0 Temp: 99.5 F (37.5 C) 99.3 F (37.4 C) 99.5 F (37.5 C) 99.5 F (37.5 C)  TempSrc:      SpO2: 97% 97% 98% 96%  Weight:      Height:        Intake/Output Summary (Last 24 hours) at 08/01/2021 1315 Last data filed at 08/01/2021 1300 Gross per 24 hour  Intake 777.86 ml  Output 1045 ml  Net -267.14 ml   Filed Weights   07/31/21 0100 07/31/21 1048 08/01/21 0353  Weight: 75.8 kg 77.7 kg 70.2 kg   Body mass index is 24.24 kg/m.  General: Elderly white female, intubated and lightly sedated.  Appears slightly uncomfortable HEENT: normal Lymph: no adenopathy Cardiac:  normal S1, S2; RRR; 3 out of 6 holosystolic murmur at apex. Lungs:  clear to auscultation bilaterally, no wheezing, rhonchi or rales  Abd: soft, nontender, no hepatomegaly  Ext: no edema Musculoskeletal:  No deformities, BUE and BLE strength normal and equal Skin: warm and dry.  IABP in place. Neuro:  CNs 2-12 intact, no focal abnormalities noted Psych:  Normal affect    EKG:  The EKG was personally reviewed and demonstrates:  sinus with anterior q waves. Telemetry:  Telemetry was personally reviewed and demonstrates: Sinus tach with occasional PVCs.  Relevant CV Studies: Echo 07/31/21 1. Left ventricular ejection fraction, by estimation, is 60 to 65%. The  left ventricle has normal function. The left ventricle demonstrates  regional wall motion abnormalities with mid inferolateral hypokinesis.  Left ventricular diastolic parameters are  consistent with Grade II diastolic dysfunction (pseudonormalization).   2. Right ventricular systolic function is normal. The right ventricular  size is normal. There is severely elevated pulmonary artery systolic  pressure. The estimated right ventricular systolic pressure is 88.8 mmHg.   3. Left atrial size was severely dilated.   4. Right atrial size was mildly dilated.   5. The mitral valve is abnormal. Severe mitral valve regurgitation. MR is   highly eccentric and posteriorly directed. In the parasternal long axis  view, I question possible A2 partial flail. There is splay artifact noted.  PISA ERO 0.39 cm^2 (not likely  to be completely accurate as off axis). No evidence of mitral stenosis.   6. The aortic valve is tricuspid. Aortic valve  regurgitation is not  visualized. No aortic stenosis is present.   7. The inferior vena cava is dilated in size with <50% respiratory  variability, suggesting right atrial pressure of 15 mmHg.    _____________________________   TEE 07/31/21 IMPRESSIONS  1. Left ventricular ejection fraction, by estimation, is 55%. The left  ventricle has normal function. The left ventricle demonstrates regional  wall motion abnormalities with hypokinesis of the mid lateral wall.   2. Right ventricular systolic function is normal. The right ventricular  size is normal.   3. The mitral valve is abnormal. Severe mitral valve regurgitation. There  was prolapse/partial flail of the A2 segment as well as posterior leaflet  restriction likely from the lateral wall hypokinesis. Suspect combination  of infarct-related MR and MR due to partial flail A2 with eccentric posteriorly-directed jet. I was worried about rupture of papillary muscle but no definite evidence for this. The dilated left atrium suggests that there was some pre-existing mitral regurgitation that may have  become acutely worse with MI involving inferolateral wall. No evidence of  mitral stenosis. The mean mitral valve gradient is 3.0 mmHg.   4. Peak RV-RA gradient 26 mmHg.   5. The aortic valve is tricuspid. Aortic valve regurgitation is not  visualized. No aortic stenosis is present.   6. No ASD or PFO by color doppler.   7. Left atrial size was moderately dilated. No left atrial/left atrial  appendage thrombus was detected.    ____________________   CARDIAC CATHETERIZATION Result Date: 07/31/2021 1. Severe MR with elevated filling pressures.  2. IABP inserted successfully.  Start heparin gtt for IABP.   Laboratory Data:  Chemistry Recent Labs  Lab 07/30/21 1351 07/31/21 0453 07/31/21 1556 07/31/21 1842 07/31/21 1843 08/01/21 0339  NA 133* 135   < > 138 138 139  K 4.4 4.2   < > 4.3 4.3 4.4  CL 102 104  --   --   --  107  CO2 18* 18*  --   --   --  19*  GLUCOSE 136* 133*  --   --   --  105*  BUN 62* 60*  --   --   --  72*  CREATININE 4.43* 3.81*  --   --   --  3.40*  CALCIUM 9.5 8.8*  --   --   --  8.3*  GFRNONAA 9* 11*  --   --   --  13*  ANIONGAP 13 13  --   --   --  13   < > = values in this interval not displayed.    Recent Labs  Lab 07/30/21 1351 07/31/21 0453 08/01/21 0339  PROT 6.9 6.3* 5.2*  ALBUMIN 4.2 3.6 2.7*  AST 112* 79* 51*  ALT _0 ALKPHOS 58 51 48  BILITOT 1.0 1.0 0.8   Hematology Recent Labs  Lab 07/30/21 1351 07/31/21 0453 07/31/21 1556 07/31/21 1842 07/31/21 1843 08/01/21 0339  WBC 20.4* 17.9*  --   --   --  15.3*  RBC 3.84* 3.49*  --   --   --  3.10*  HGB 12.2 10.9*   < > 10.5* 11.2* 9.8*  HCT 35.7* 33.7*   < > 31.0* 33.0* 31.1*  MCV 93.0 96.6  --   --   --  100.3*  MCH 31.8 31.2  --   --   --  31.6  MCHC 34.2 32.3  --   --   --  31.5  RDW 13.2 13.2  --   --   --  13.3  PLT 212 153  --   --   --  130*   < > = values in this interval not displayed.   Cardiac EnzymesNo results for input(s): TROPONINI in the last 168 hours. No results for input(s): TROPIPOC in the last 168 hours.  BNP Recent Labs  Lab 07/30/21 1945  BNP 2,001.3*    DDimer No results for input(s): DDIMER in the last 168 hours.  Radiology/Studies:  CT ABDOMEN PELVIS WO CONTRAST  Result Date: 07/30/2021 CLINICAL DATA:  Acute abdominal pain. EXAM: CT ABDOMEN AND PELVIS WITHOUT CONTRAST TECHNIQUE: Multidetector CT imaging of the abdomen and pelvis was performed following the standard protocol without IV contrast. COMPARISON:  None. FINDINGS: Lower chest: There are small bilateral pleural effusions.  There is some patchy airspace disease in the left lower lobe and atelectasis in the right lower lobe. Hepatobiliary: No focal liver abnormality is seen. No gallstones, gallbladder wall thickening, or biliary dilatation. Pancreas: Unremarkable. No pancreatic ductal dilatation or surrounding inflammatory changes. Spleen: Normal in size without focal abnormality. Adrenals/Urinary Tract: There are small left peripelvic cysts. The kidneys, adrenal glands, ureters and bladder are otherwise within normal limits. Stomach/Bowel: Stomach is within normal limits. Appendix appears normal. No evidence of bowel wall thickening, distention, or inflammatory changes. There is sigmoid colon diverticulosis without evidence for acute diverticulitis. Vascular/Lymphatic: Aortic atherosclerosis. No enlarged abdominal or pelvic lymph nodes. Reproductive: Uterus and bilateral adnexa are unremarkable. Other: There is a small amount of free fluid in the pelvis. VP shunt catheter is present entering the right abdomen with distal catheter tip located in the right pelvis. The visualized catheter appears intact. There is a small fat containing umbilical hernia. Musculoskeletal: Multilevel degenerative changes affect the spine. There is mild chronic compression deformity of the superior endplate of G89. There is a healed right inferior pubic ramus fracture. There are severe degenerative changes of both hips. No acute fractures are identified. IMPRESSION: 1. Small amount of free fluid in the pelvis. 2. 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 3. Colonic diverticulosis without evidence for diverticulitis. 4. Small bilateral pleural effusions with left lower lobe airspace disease worrisome for infection. 5.  Aortic Atherosclerosis (ICD10-I70.0). Electronically Signed   By: Ronney Asters M.D.   On: 07/30/2021 15:36   CT Head Wo Contrast  Result Date: 07/30/2021 CLINICAL DATA:  Delirium. Bilateral leg weakness. Altered mental status. Shunt. EXAM: CT HEAD  WITHOUT CONTRAST TECHNIQUE: Contiguous axial images were obtained from the base of the skull through the vertex without intravenous contrast. COMPARISON:  11/09/2018 FINDINGS: Brain: Right frontal ventricular shunt catheter in the right lateral ventricle unchanged. Moderate ventricular enlargement most notably of the third and lateral ventricles. Lateral ventricle size slightly larger. Biventricular diameter now 74.5 mm compared with 73 mm previously. Mild periventricular white matter hypodensity. Negative for acute infarct, hemorrhage, mass Vascular: Negative for hyperdense vessel Skull: No acute abnormality Sinuses/Orbits: Paranasal sinuses clear. Bilateral cataract extraction Other: None IMPRESSION: Right frontal shunt catheter unchanged. Slight progression of ventricle enlargement. No acute abnormality. Electronically Signed   By: Franchot Gallo M.D.   On: 07/30/2021 14:18   CARDIAC CATHETERIZATION  Result Date: 07/31/2021 1. Severe MR with elevated filling pressures. 2. IABP inserted successfully.  Start heparin gtt for IABP.   DG CHEST PORT 1 VIEW  Result Date: 08/01/2021 CLINICAL DATA:  Hypoxia R09.02 (ICD-10-CM) EXAM: PORTABLE CHEST 1 VIEW COMPARISON:  July 31, 2021. FINDINGS: Right IJ Swan-Ganz catheter with  the tip projecting in the expected region of the distal right pulmonary artery or proximal descending interlobar artery. Gastric tube courses below the diaphragm with the tip outside the field of view. Endotracheal tube tip at the level of clavicular heads. Intra or a balloon pump marker low the aortic knob in the region of the AP window. Presumed shunt catheter traverses the right neck and chest, extending outside the field of view. Slightly increased dense left basilar opacity. Proved patchy right lung opacities. Cardiomediastinal silhouette is within normal limits. Partially imaged ACDF. IMPRESSION: 1. Slightly increased dense left basilar atelectasis or consolidation. 2. Improved patchy  right lung opacities. 3. Support devices as detailed above. Electronically Signed   By: Margaretha Sheffield M.D.   On: 08/01/2021 11:12   DG CHEST PORT 1 VIEW  Result Date: 07/31/2021 CLINICAL DATA:  Central line placement. EXAM: PORTABLE CHEST 1 VIEW COMPARISON:  Same day. FINDINGS: Stable cardiomediastinal silhouette. Interval placement of left internal jugular catheter with distal tip in expected position of the SVC. Endotracheal and nasogastric tubes are in good position. Stable position of right internal jugular catheter with distal tip in expected position of cavoatrial junction. No pneumothorax is noted. Right upper lobe opacity is noted concerning for pneumonia. Left basilar atelectasis is noted with probable effusion. IMPRESSION: Interval placement of left internal jugular catheter with distal tip in expected position of the SVC. Endotracheal and nasogastric tubes are in good position. Stable position of right internal jugular catheter. Stable bilateral lung opacities as described above. Electronically Signed   By: Marijo Conception M.D.   On: 07/31/2021 17:33   DG CHEST PORT 1 VIEW  Result Date: 07/31/2021 CLINICAL DATA:  80 year old female with respiratory failure. EXAM: PORTABLE CHEST 1 VIEW COMPARISON:  Portable chest 07/30/2021 and earlier. FINDINGS: Portable AP semi upright view at 912 hours. Stable lung volumes and mediastinal contours. Increasing left lung base opacity, now partially obscuring the left hemidiaphragm. Right chest shunt catheter redemonstrated. No pneumothorax. No pulmonary edema or definite effusion. Partially visible cervical ACDF. No acute osseous abnormality identified. IMPRESSION: 1. Increasing left lung base opacity since yesterday compatible with progressive atelectasis or infection. 2. No other acute cardiopulmonary abnormality identified. Electronically Signed   By: Genevie Ann M.D.   On: 07/31/2021 09:34   DG Chest Port 1 View  Result Date: 07/30/2021 CLINICAL DATA:   Possible sepsis.  Leg weakness. EXAM: PORTABLE CHEST 1 VIEW COMPARISON:  07/20/2017. FINDINGS: Trachea is midline. Heart size stable. Thoracic aorta is calcified. Shunt catheter projects over the midline chest. Biapical pleural thickening. Bibasilar subsegmental volume loss. Probable small left pleural effusion. IMPRESSION: 1. Probable small left pleural effusion. 2. Bibasilar atelectasis. Electronically Signed   By: Lorin Picket M.D.   On: 07/30/2021 14:04   DG Abd Portable 1V  Result Date: 07/31/2021 CLINICAL DATA:  Orogastric tube placement. EXAM: PORTABLE ABDOMEN - 1 VIEW COMPARISON:  None. FINDINGS: Distal tip of nasogastric tube appears to be in expected position of distal stomach. IMPRESSION: Distal tip of nasogastric tube appears to be in expected position of distal stomach. Electronically Signed   By: Marijo Conception M.D.   On: 07/31/2021 17:31   ECHOCARDIOGRAM COMPLETE  Result Date: 07/31/2021    ECHOCARDIOGRAM REPORT   Patient Name:   MALAKA RUFFNER Date of Exam: 07/31/2021 Medical Rec #:  [id number]              Height:       67.0 in Accession #:  [id number]             Weight:       167.1 lb Date of Birth:  [birth year]               BSA:          1.874 m Patient Age:    30 years               BP:           108/61 mmHg Patient Gender: F                      HR:           96 bpm. Exam Location:  Inpatient Procedure: 2D Echo, Cardiac Doppler and Color Doppler Indications:     Chest pain  History:         Patient has no prior history of Echocardiogram examinations.                  Risk Factors:Hypertension.  Sonographer:     Helmut Muster Referring Phys:  [place] Diagnosing Phys: Franki Monte IMPRESSIONS  1. Left ventricular ejection fraction, by estimation, is 60 to 65%. The left ventricle has normal function. The left ventricle demonstrates regional wall motion abnormalities with mid inferolateral hypokinesis. Left ventricular diastolic parameters are consistent with  Grade II diastolic dysfunction (pseudonormalization).  2. Right ventricular systolic function is normal. The right ventricular size is normal. There is severely elevated pulmonary artery systolic pressure. The estimated right ventricular systolic pressure is 23.3 mmHg.  3. Left atrial size was severely dilated.  4. Right atrial size was mildly dilated.  5. The mitral valve is abnormal. Severe mitral valve regurgitation. MR is highly eccentric and posteriorly directed. In the parasternal long axis view, there is a mobile structure associated with the anterior leaflet. Query ruptured papillary muscle versus partial flail leaflet. There is splay artifact noted. PISA ERO 0.39 cm^2 (not likely to be completely accurate as off axis). No evidence of mitral stenosis.  6. The aortic valve is tricuspid. Aortic valve regurgitation is not visualized. No aortic stenosis is present.  7. The inferior vena cava is dilated in size with <50% respiratory variability, suggesting right atrial pressure of 15 mmHg.  8. I worry about ruptured papillary muscle with acute severe MR given inferolateral wall motion abnormality. Needs TEE. FINDINGS  Left Ventricle: Left ventricular ejection fraction, by estimation, is 60 to 65%. The left ventricle has normal function. The left ventricle demonstrates regional wall motion abnormalities. The left ventricular internal cavity size was normal in size. There is no left ventricular hypertrophy. Left ventricular diastolic parameters are consistent with Grade II diastolic dysfunction (pseudonormalization). Right Ventricle: The right ventricular size is normal. No increase in right ventricular wall thickness. Right ventricular systolic function is normal. There is severely elevated pulmonary artery systolic pressure. The tricuspid regurgitant velocity is 3.49 m/s, and with an assumed right atrial pressure of 15 mmHg, the estimated right ventricular systolic pressure is 00.7 mmHg. Left Atrium: Left atrial  size was severely dilated. Right Atrium: Right atrial size was mildly dilated. Pericardium: There is no evidence of pericardial effusion. Mitral Valve: The mitral valve is abnormal. Severe mitral valve regurgitation. No evidence of mitral valve stenosis. Tricuspid Valve: The tricuspid valve is normal in structure. Tricuspid valve regurgitation is mild. Aortic Valve: The aortic valve is tricuspid. Aortic valve regurgitation is not visualized. No aortic stenosis is present. Pulmonic Valve:  The pulmonic valve was normal in structure. Pulmonic valve regurgitation is not visualized. Aorta: The aortic root is normal in size and structure. Venous: The inferior vena cava is dilated in size with less than 50% respiratory variability, suggesting right atrial pressure of 15 mmHg. IAS/Shunts: No atrial level shunt detected by color flow Doppler.  LEFT VENTRICLE PLAX 2D LVIDd:         4.80 cm     Diastology LVIDs:         3.30 cm     LV e' lateral: 9.25 cm/s LV PW:         0.80 cm LV IVS:        0.80 cm LVOT diam:     2.10 cm LV SV:         68 LV SV Index:   36 LVOT Area:     3.46 cm  LV Volumes (MOD) LV vol d, MOD A2C: 51.8 ml LV vol d, MOD A4C: 60.2 ml LV vol s, MOD A2C: 16.0 ml LV vol s, MOD A4C: 26.1 ml LV SV MOD A2C:     35.8 ml LV SV MOD A4C:     60.2 ml LV SV MOD BP:      35.9 ml RIGHT VENTRICLE             IVC RV S prime:     21.40 cm/s  IVC diam: 2.50 cm TAPSE (M-mode): 2.1 cm LEFT ATRIUM              Index        RIGHT ATRIUM           Index LA diam:        4.10 cm  2.19 cm/m   RA Area:     18.20 cm LA Vol (A2C):   95.7 ml  51.08 ml/m  RA Volume:   50.20 ml  26.79 ml/m LA Vol (A4C):   126.0 ml 67.25 ml/m LA Biplane Vol: 108.0 ml 57.64 ml/m  AORTIC VALVE LVOT Vmax:   120.00 cm/s LVOT Vmean:  67.500 cm/s LVOT VTI:    0.195 m  AORTA Ao Root diam: 3.40 cm Ao Asc diam:  3.30 cm MR Peak grad:    64.0 mmHg    TRICUSPID VALVE MR Mean grad:    41.0 mmHg    TR Peak grad:   48.7 mmHg MR Vmax:         400.00 cm/s  TR Vmax:         349.00 cm/s MR Vmean:        300.0 cm/s MR PISA:         5.09 cm     SHUNTS MR PISA Eff ROA: 39 mm       Systemic VTI:  0.20 m MR PISA Radius:  0.90 cm      Systemic Diam: 2.10 cm Dalton McleanMD Electronically signed by Franki Monte Signature Date/Time: 07/31/2021/10:52:39 AM    Final (Updated)    ECHO TEE  Result Date: 07/31/2021    TRANSESOPHOGEAL ECHO REPORT   Patient Name:   SHANETTA NICOLLS Date of Exam: 07/31/2021 Medical Rec #:  [id number]              Height:       67.0 in Accession #:    [id number]             Weight:       171.3 lb Date of Birth:  08-Nov-1940  BSA:          1.893 m Patient Age:    24 years               BP:           125/67 mmHg Patient Gender: F                      HR:           100 bpm. Exam Location:  Inpatient Procedure: Transesophageal Echo, Cardiac Doppler, Color Doppler and 3D Echo Indications:     Mitral regurgitation  History:         Patient has prior history of Echocardiogram examinations, most                  recent 07/31/2021. Signs/Symptoms:Shortness of Breath; Risk                  Factors:Hypertension and Dyslipidemia.  Sonographer:     Clayton Lefort RDCS (AE) Referring Phys:  [place] Diagnosing Phys: Kirk Ruths McleanMD PROCEDURE: After discussion of the risks and benefits of a TEE, an informed consent was obtained from a family member. The transesophogeal probe was passed without difficulty through the esophogus of the patient. Sedation performed by performing physician. Patients was under conscious sedation during this procedure. Anesthetic administered: 338mg of Fentanyl, 2.04mof Versed. Image quality was good. The patient developed no complications during the procedure. IMPRESSIONS  1. Left ventricular ejection fraction, by estimation, is 55%. The left ventricle has normal function. The left ventricle demonstrates regional wall motion abnormalities with hypokinesis of the mid lateral wall.  2. Right ventricular systolic  function is normal. The right ventricular size is normal.  3. The mitral valve is abnormal. Severe mitral valve regurgitation. There was prolapse/partial flail of the A2 segment as well as posterior leaflet restriction likely from the lateral wall hypokinesis. Suspect combination of infarct-related MR and MR due to partial flail A2 with eccentric posteriorly-directed jet. I was worried about rupture of papillary muscle but no definite evidence for this. The dilated left atrium suggests that there was some pre-existing mitral regurgitation that may have become acutely worse with MI involving inferolateral wall. No evidence of mitral stenosis. The mean mitral valve gradient is 3.0 mmHg.  4. Peak RV-RA gradient 26 mmHg.  5. The aortic valve is tricuspid. Aortic valve regurgitation is not visualized. No aortic stenosis is present.  6. No ASD or PFO by color doppler.  7. Left atrial size was moderately dilated. No left atrial/left atrial appendage thrombus was detected. FINDINGS  Left Ventricle: Left ventricular ejection fraction, by estimation, is 55%. The left ventricle has normal function. The left ventricle demonstrates regional wall motion abnormalities. The left ventricular internal cavity size was small. There is no left ventricular hypertrophy. Right Ventricle: The right ventricular size is normal. No increase in right ventricular wall thickness. Right ventricular systolic function is normal. Left Atrium: Left atrial size was moderately dilated. No left atrial/left atrial appendage thrombus was detected. Right Atrium: Right atrial size was normal in size. Pericardium: There is no evidence of pericardial effusion. Mitral Valve: The mitral valve is abnormal. Severe mitral valve regurgitation. No evidence of mitral valve stenosis. MV peak gradient, 5.7 mmHg. The mean mitral valve gradient is 3.0 mmHg. Tricuspid Valve: Peak RV-RA gradient 26 mmHg. The tricuspid valve is normal in structure. Tricuspid valve  regurgitation is mild. Aortic Valve: The aortic valve is tricuspid. Aortic valve regurgitation  is not visualized. No aortic stenosis is present. Pulmonic Valve: The pulmonic valve was normal in structure. Pulmonic valve regurgitation is trivial. Aorta: The aortic root is normal in size and structure. IAS/Shunts: No ASD or PFO by color doppler.  MITRAL VALVE            TRICUSPID VALVE MV Peak grad: 5.7 mmHg  TR Peak grad:   25.8 mmHg MV Mean grad: 3.0 mmHg  TR Vmax:        254.00 cm/s MV Vmax:      1.19 m/s MV Vmean:     77.4 cm/s Dalton McleanMD Electronically signed by Franki Monte Signature Date/Time: 07/31/2021/5:55:01 PM    Final (Updated)      STS Risk Calculator: STS Risk Calculator:   Procedure: MVR + CAB Risk of Mortality: 28.228% Renal Failure: 11.200% Permanent Stroke: 2.948% Prolonged Ventilation: 43.565% DSW Infection: 0.242% Reoperation: 10.012% Morbidity or Mortality: 54.609% Short Length of Stay: 5.095% Long Length of Stay: 24.933%   Procedure: MV Repair + CAB Risk of Mortality: 17.172% Renal Failure: 4.533% Permanent Stroke: 10.823% Prolonged Ventilation: 34.330% DSW Infection: 0.058% Reoperation: 8.454% Morbidity or Mortality: 48.158% Short Length of Stay: 7.239% Long Length of Stay: 25.106%   __________________________     [facility] Cardiomyopathy Questionnaire  KCCQ-12 08/01/2021  1 a. Ability to shower/bathe Not at all limited  1 b. Ability to walk 1 block Extremely limited  1 c. Ability to hurry/jog Other, Did not do  2. Edema feet/ankles/legs Never over the past 2 weeks  3. Limited by fatigue All of the time  4. Limited by dyspnea At least once a day  5. Sitting up / on 3+ pillows Never over the past 2 weeks  6. Limited enjoyment of life Limited quite a bit  7. Rest of life w/ symptoms Mostly dissatisfied  8 a. Participation in hobbies Limited quite a bit  8 b. Participation in chores Limited quite a bit  8 c. Visiting  family/friends Limited quite a bit     Assessment and Plan:    Crystle Carelli is a 80 y.o. 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 I have reviewed the patient's recent echocardiogram which is notable for EF 60 to 65%, mild inferolateral hypokinesis normal RV, severely dilated left atrium, and severe mitral regurgitation with a possible A2 partial flail leaflet.  Her MR was highly eccentric and posteriorly directed.    Given acute hypoxemic respiratory failure on BiPAP and need for TEE, the decision was made for intubation.  Additionally, Dr. Aundra Dubin placed an IABP and Swan catheter.  Right heart catheterization showed elevated filling pressures with a high PCWP.  Surprisingly, she did not have significant V waves. LHC deferred given AKI.    TEE showed severe, eccentric posteriorly-directed mitral regurgitation. There was prolapse/partial flail of the A2 segment of the anterior leaflet.  There was also restriction of the posterior leaflet.  Given severe left atrial dilation, it was suspected that she had had some degree of MR prior to this acute event.  It was suspect that there was pre-existing prolapse/partial flail of A2 and patient had NSTEMI involving LCx territory, leading to lateral wall motion abnormality and restriction of the posterior leaflet with acute worsening of the mitral regurgitation and eventually causing cardiogenic shock. TEE did not show definite evidence for papillary muscle  rupture.    Blood pressure and renal function improved after balloon pump insertion.  Creatinine down to 3.4 (peaked at 4.42).  Lactic acid downtrending from 2.6-->1.2.   I have reviewed the natural history of mitral regurgitation with the patient. We have discussed the limitations of medical  therapy and the poor prognosis associated with symptomatic mitral regurgitation. We have also reviewed potential treatment options, including palliative medical therapy, conventional surgical mitral valve repair or replacement, and percutaneous mitral valve repair with MitraClip. We discussed treatment options in the context of this patient's specific comorbid medical conditions.    The patient's predicted risk of mortality with conventional mitral valve replacement/repair is 28.2 % / 17.1 % respectively,  primarily based on age, cardiogenic shock, NSTEMI with suspected CAD, and chronic immunosuppression. Other significant comorbid conditions physical deconditioning. Due to her acute decompensation and poor functional status at baseline she would not be considered for surgical mitral valve repair under any circumstances and the family said the patient would refuse this aynway.    She is tentatively scheduled for mitral valve transcatheter edge-to-edge repair with Dr. Dierdre Harness on Monday 08/04/21.  Plan to do this procedure with the balloon pump in and then wean her off after clip in place. PCCM considering extubation today.  Continue to monitor status over the weekend.  Hopefully, renal function will continue to improve.  Dr. Cyndia Bent to follow.     Signed, Angelena Form, PA-C  08/01/2021 1:15 PM   Chart reviewed, patient examined, agree with above. This 80 year old woman was admitted with cardiogenic shock secondary to NSTEMI with severe mixed mitral regurgitation due to degenerative and ischemic MR with acute hypoxic respiratory failure requiring intubation.  TEE showed severe eccentric posteriorly directed mitral regurgitation due to partial flail and prolapse of A2 as well as restriction of the posterior leaflet.  Left ventricular ejection fraction was 60 to 65% with mild inferolateral hypokinesis and there was a severely dilated left atrium.  Her hemodynamics have improved with IABP.  She  was extubated to Bipap today. Left heart cath was not performed due to acute kidney injury with a creatinine that peaked at 4.4 but right heart catheterization showed elevated filling pressures.  Her predicted risk of mortality for open surgical mitral valve repair/replacement and CABG is 17.1% / 28.2% due to her advanced age, cardiogenic shock, NSTEMI with probable coronary disease, chronic immunosuppression, and acute kidney injury.  She has poor functional status at baseline and I do not think she would make a functional recovery if she got through the surgery.  I think her operative risk is prohibitive and I would not consider her an operative candidate for open surgery.  I think Mitraclip is a reasonable alternative for treating her. I think she is unlikely to recover without significant improvement in her mitral regurgitation.  Gaye Pollack, MD Cardiothoracic Surgery

## 2021-08-01 NOTE — Progress Notes (Signed)
Pt becoming restless. RT at bedside and put pt back on full support as pt only taking 3 breaths a minute on her own. Restarted sedation and will titrate up per orders. MD made aware, monitoring closely.

## 2021-08-02 ENCOUNTER — Inpatient Hospital Stay (HOSPITAL_COMMUNITY): Payer: Medicare Other

## 2021-08-02 DIAGNOSIS — R570 Cardiogenic shock: Secondary | ICD-10-CM

## 2021-08-02 DIAGNOSIS — I512 Rupture of papillary muscle, not elsewhere classified: Secondary | ICD-10-CM

## 2021-08-02 LAB — HEPARIN LEVEL (UNFRACTIONATED)
Heparin Unfractionated: 0.14 IU/mL — ABNORMAL LOW (ref 0.30–0.70)
Heparin Unfractionated: 0.24 IU/mL — ABNORMAL LOW (ref 0.30–0.70)

## 2021-08-02 LAB — URINE CULTURE: Culture: NO GROWTH

## 2021-08-02 LAB — BASIC METABOLIC PANEL
Anion gap: 10 (ref 5–15)
Anion gap: 11 (ref 5–15)
BUN: 68 mg/dL — ABNORMAL HIGH (ref 8–23)
BUN: 60 mg/dL — ABNORMAL HIGH (ref 8–23)
CO2: 21 mmol/L — ABNORMAL LOW (ref 22–32)
CO2: 21 mmol/L — ABNORMAL LOW (ref 22–32)
Calcium: 8.2 mg/dL — ABNORMAL LOW (ref 8.9–10.3)
Calcium: 8.5 mg/dL — ABNORMAL LOW (ref 8.9–10.3)
Chloride: 111 mmol/L (ref 98–111)
Chloride: 110 mmol/L (ref 98–111)
Creatinine, Ser: 2.64 mg/dL — ABNORMAL HIGH (ref 0.44–1.00)
Creatinine, Ser: 2.34 mg/dL — ABNORMAL HIGH (ref 0.44–1.00)
GFR, Estimated: 18 mL/min — ABNORMAL LOW (ref >60)
GFR, Estimated: 20 mL/min — ABNORMAL LOW (ref >60)
Glucose, Bld: 106 mg/dL — ABNORMAL HIGH (ref 70–99)
Glucose, Bld: 131 mg/dL — ABNORMAL HIGH (ref 70–99)
Potassium: 3.7 mmol/L (ref 3.5–5.1)
Potassium: 3.4 mmol/L — ABNORMAL LOW (ref 3.5–5.1)
Sodium: 142 mmol/L (ref 135–145)
Sodium: 142 mmol/L (ref 135–145)

## 2021-08-02 LAB — GLUCOSE, CAPILLARY
Glucose-Capillary: 103 mg/dL — ABNORMAL HIGH (ref 70–99)
Glucose-Capillary: 106 mg/dL — ABNORMAL HIGH (ref 70–99)
Glucose-Capillary: 81 mg/dL (ref 70–99)

## 2021-08-02 LAB — CBC
HCT: 30.8 % — ABNORMAL LOW (ref 36.0–46.0)
Hemoglobin: 9.6 g/dL — ABNORMAL LOW (ref 12.0–15.0)
MCH: 31 pg (ref 26.0–34.0)
MCHC: 31.2 g/dL (ref 30.0–36.0)
MCV: 99.4 fL (ref 80.0–100.0)
Platelets: 92 10*3/uL — ABNORMAL LOW (ref 150–400)
RBC: 3.1 MIL/uL — ABNORMAL LOW (ref 3.87–5.11)
RDW: 13.5 % (ref 11.5–15.5)
WBC: 10.4 10*3/uL (ref 4.0–10.5)
nRBC: 0 % (ref 0.0–0.2)

## 2021-08-02 LAB — VANCOMYCIN, RANDOM: Vancomycin Rm: 14

## 2021-08-02 LAB — COOXEMETRY PANEL
Carboxyhemoglobin: 1.1 % (ref 0.5–1.5)
Methemoglobin: 1 % (ref 0.0–1.5)
O2 Saturation: 74.6 %
Total hemoglobin: 12.2 g/dL (ref 12.0–16.0)

## 2021-08-02 LAB — MAGNESIUM
Magnesium: 2.1 mg/dL (ref 1.7–2.4)
Magnesium: 2.2 mg/dL (ref 1.7–2.4)

## 2021-08-02 LAB — PROCALCITONIN: Procalcitonin: 1.92 ng/mL

## 2021-08-02 MED ORDER — AMIODARONE HCL IN DEXTROSE 360-4.14 MG/200ML-% IV SOLN
60.0000 mg/h | INTRAVENOUS | Status: DC
  Administered 2021-08-02 (×2): 30 mg/h via INTRAVENOUS
  Administered 2021-08-03 – 2021-08-05 (×7): 60 mg/h via INTRAVENOUS
  Filled 2021-08-02 (×8): qty 200

## 2021-08-02 MED ORDER — VANCOMYCIN HCL IN DEXTROSE 1-5 GM/200ML-% IV SOLN
1000.0000 mg | Freq: Once | INTRAVENOUS | Status: AC
  Administered 2021-08-02: 1000 mg via INTRAVENOUS
  Filled 2021-08-02: qty 200

## 2021-08-02 MED ORDER — METOPROLOL TARTRATE 5 MG/5ML IV SOLN
2.5000 mg | Freq: Once | INTRAVENOUS | Status: AC
  Administered 2021-08-02: 2.5 mg via INTRAVENOUS

## 2021-08-02 MED ORDER — HYDRALAZINE HCL 20 MG/ML IJ SOLN
10.0000 mg | Freq: Once | INTRAMUSCULAR | Status: AC
  Administered 2021-08-02: 10 mg via INTRAVENOUS
  Filled 2021-08-02: qty 1

## 2021-08-02 MED ORDER — METOPROLOL TARTRATE 5 MG/5ML IV SOLN
INTRAVENOUS | Status: AC
  Administered 2021-08-02: 5 mg
  Filled 2021-08-02: qty 5

## 2021-08-02 MED ORDER — AMIODARONE LOAD VIA INFUSION
150.0000 mg | Freq: Once | INTRAVENOUS | Status: AC
  Administered 2021-08-02: 150 mg via INTRAVENOUS
  Filled 2021-08-02: qty 83.34

## 2021-08-02 MED ORDER — AMIODARONE HCL IN DEXTROSE 360-4.14 MG/200ML-% IV SOLN
60.0000 mg/h | INTRAVENOUS | Status: AC
  Administered 2021-08-02 (×2): 60 mg/h via INTRAVENOUS
  Filled 2021-08-02 (×2): qty 200

## 2021-08-02 MED ORDER — AMIODARONE IV BOLUS ONLY 150 MG/100ML
150.0000 mg | Freq: Once | INTRAVENOUS | Status: AC
  Administered 2021-08-03: 150 mg via INTRAVENOUS
  Filled 2021-08-02: qty 100

## 2021-08-02 MED ORDER — POTASSIUM CHLORIDE 10 MEQ/50ML IV SOLN
10.0000 meq | INTRAVENOUS | Status: AC
  Administered 2021-08-03 (×3): 10 meq via INTRAVENOUS
  Filled 2021-08-02 (×3): qty 50

## 2021-08-02 MED ORDER — AMIODARONE HCL IN DEXTROSE 360-4.14 MG/200ML-% IV SOLN
INTRAVENOUS | Status: AC
  Filled 2021-08-02: qty 200

## 2021-08-02 MED ORDER — MILRINONE LACTATE IN DEXTROSE 20-5 MG/100ML-% IV SOLN
0.1250 ug/kg/min | INTRAVENOUS | Status: DC
  Administered 2021-08-02: 0.25 ug/kg/min via INTRAVENOUS
  Filled 2021-08-02: qty 100

## 2021-08-02 MED ORDER — HYDRALAZINE HCL 20 MG/ML IJ SOLN
5.0000 mg | Freq: Four times a day (QID) | INTRAMUSCULAR | Status: DC | PRN
  Administered 2021-08-03 – 2021-08-06 (×6): 5 mg via INTRAVENOUS
  Filled 2021-08-02 (×7): qty 1

## 2021-08-02 MED ORDER — METOPROLOL TARTRATE 5 MG/5ML IV SOLN
2.5000 mg | Freq: Once | INTRAVENOUS | Status: AC
  Administered 2021-08-02: 2.5 mg via INTRAVENOUS
  Filled 2021-08-02: qty 5

## 2021-08-02 MED ORDER — AMIODARONE IV BOLUS ONLY 150 MG/100ML
150.0000 mg | Freq: Once | INTRAVENOUS | Status: AC
  Administered 2021-08-02: 150 mg via INTRAVENOUS

## 2021-08-02 NOTE — Progress Notes (Signed)
ANTICOAGULATION CONSULT NOTE  Pharmacy Consult for heparin Indication:  IABP  Allergies  Allergen Reactions   Methotrexate Derivatives Other (See Comments)    Dangerously decreased platelet count   Penicillins Itching    Tolerated Cephalosporin Date: 04/30/20.     Sulfa Antibiotics Itching    Patient Measurements: Height: 5\' 7"  (170.2 cm) Weight: 68.5 kg (151 lb 0.2 oz) IBW/kg (Calculated) : 61.6 Heparin Dosing Weight: 77kg  Vital Signs: Temp: 100 F (37.8 C) (10/22 0600) BP: 119/74 (10/22 0700) Pulse Rate: 101 (10/22 0600)  Labs: Recent Labs    07/30/21 1351 07/30/21 1624 07/30/21 1945 07/30/21 2119 07/31/21 0453 07/31/21 1556 07/31/21 1843 08/01/21 0339 08/02/21 0500  HGB 12.2  --   --   --  10.9*   < > 11.2* 9.8* 9.6*  HCT 35.7*  --   --   --  33.7*   < > 33.0* 31.1* 30.8*  PLT 212  --   --   --  153  --   --  130* 92*  APTT 23*  --   --   --   --   --   --   --   --   LABPROT 13.4  --   --   --   --   --   --   --   --   INR 1.0  --   --   --   --   --   --   --   --   HEPARINUNFRC  --   --   --   --   --   --   --  0.23* 0.14*  CREATININE 4.43*  --   --   --  3.81*  --   --  3.40* 2.64*  TROPONINIHS 29,924* 16,254* 13,992* 14,937*  --   --   --   --   --    < > = values in this interval not displayed.     Estimated Creatinine Clearance: 16.3 mL/min (A) (by C-G formula based on SCr of 2.64 mg/dL (H)).   Assessment: 80 yo female admitted with cardiogenic shock s/p IABP for heparin. Will remain on IABP until plan for MitraClip on Monday. No AC PTA.   Heparin level this AM came back subtherapeutic at 0.14 on 750 units/hr. Hemoglobin stable at 9.8 and platelets decreased further to 92. However, just started IABP last night, will monitor closely. No s/sx of bleeding or infusion issues.  Goal of Therapy:  Heparin level 0.2-0.5 units/ml Monitor platelets by anticoagulation protocol: Yes   Plan:  Increase heparin infusion to 850 units/hr Daily CBC and HL  while on heparin  Antonietta Jewel, PharmD, [place] Pharmacist  Phone: [phone] 08/02/2021 7:20 AM  Please check AMION for all [place] phone numbers After 10:00 PM, call [place] [phone]

## 2021-08-02 NOTE — Progress Notes (Signed)
Pharmacy Antibiotic Note  [name] is a 80 y.o. female admitted on 07/30/2021 with shock and renal failure. Pharmacy has been consulted for vancomycin and cefepime dosing.   Scr now down to 2.64 (CrCl 16 mL/min). PCT 2.25 to 1.92. Afebrile. Vancomycin random this morning came back at 14.   Plan: Cefepime 2g IV q24h Vancomycin 1000 mg IV once today at 1200 Watch renal function, will need VR prior to redosing - will order for 10/24 unless Scr changes significantly    Height: 5\' 7"  (170.2 cm) Weight: 68.5 kg (151 lb 0.2 oz) IBW/kg (Calculated) : 61.6  Temp (24hrs), Avg:99.4 F (37.4 C), Min:99 F (37.2 C), Max:100 F (37.8 C)  Recent Labs  Lab 07/30/21 1351 07/30/21 1550 07/31/21 0453 07/31/21 2113 08/01/21 0339 08/02/21 0500  WBC 20.4*  --  17.9*  --  15.3* 10.4  CREATININE 4.43*  --  3.81*  --  3.40* 2.64*  LATICACIDVEN 2.6* 2.0*  --  1.2  --   --   VANCORANDOM  --   --   --   --   --  14     Estimated Creatinine Clearance: 16.3 mL/min (A) (by C-G formula based on SCr of 2.64 mg/dL (H)).    Allergies  Allergen Reactions   Methotrexate Derivatives Other (See Comments)    Dangerously decreased platelet count   Penicillins Itching    Tolerated Cephalosporin Date: 04/30/20.     Sulfa Antibiotics Itching    Antimicrobials this admission: Ceftriaxone 10/19 x1 Azithromycin 10/19 x1 Vancomycin 10/20 >> Cefepime 10/20 >>   Microbiology results: 10/19 BCx: NGTD 10/19 UCx: pending   10/19 MRSA PCR: negative  Thank you for allowing pharmacy to be a part of this patient's care.  Antonietta Jewel, PharmD, [place] Clinical Pharmacist  Phone: [phone] 08/02/2021 8:25 AM  Please check AMION for all [place] phone numbers After 10:00 PM, call [place] [phone]

## 2021-08-02 NOTE — Progress Notes (Signed)
ANTICOAGULATION CONSULT NOTE  Pharmacy Consult for heparin Indication:  IABP  Allergies  Allergen Reactions   Methotrexate Derivatives Other (See Comments)    Dangerously decreased platelet count   Penicillins Itching    Tolerated Cephalosporin Date: 04/30/20.     Sulfa Antibiotics Itching    Patient Measurements: Height: 5\' 7"  (170.2 cm) Weight: 68.5 kg (151 lb 0.2 oz) IBW/kg (Calculated) : 61.6 Heparin Dosing Weight: 77kg  Vital Signs: Temp: 99 F (37.2 C) (10/22 1741) BP: 126/78 (10/22 1730) Pulse Rate: 96 (10/22 1741)  Labs: Recent Labs    07/30/21 1945 07/30/21 2119 07/31/21 0453 07/31/21 1556 07/31/21 1843 08/01/21 0339 08/02/21 0500 08/02/21 1700  HGB  --   --  10.9*   < > 11.2* 9.8* 9.6*  --   HCT  --   --  33.7*   < > 33.0* 31.1* 30.8*  --   PLT  --   --  153  --   --  130* 92*  --   HEPARINUNFRC  --   --   --   --   --  0.23* 0.14* 0.24*  CREATININE  --   --  3.81*  --   --  3.40* 2.64*  --   TROPONINIHS 13,992* 71,062*  --   --   --   --   --   --    < > = values in this interval not displayed.     Estimated Creatinine Clearance: 16.3 mL/min (A) (by C-G formula based on SCr of 2.64 mg/dL (H)).   Assessment: 80 yo female admitted with cardiogenic shock s/p IABP for heparin. Will remain on IABP until plan for MitraClip on Monday. No AC PTA.   Heparin level within goal range at 0.24 - will increase slightly since patient had AFib RVR episode earlier this am.  Goal of Therapy:  Heparin level 0.2-0.5 units/ml Monitor platelets by anticoagulation protocol: Yes   Plan:  Increase heparin infusion to 950 units/h Daily heparin level CBC  Arrie Senate, PharmD, [place], [facility] Clinical Pharmacist [phone] Please check AMION for all [place] numbers 08/02/2021

## 2021-08-02 NOTE — Procedures (Signed)
Extubation Procedure Note  Patient Details:   Name: [name] DOB: Jun 08, 1941 MRN: [id number]   Airway Documentation:    Vent end date: 08/02/21 Vent end time: 1000   Evaluation  O2 sats: stable throughout Complications: No apparent complications Patient did tolerate procedure well. Bilateral Breath Sounds: Clear, Diminished   Yes  Positive for cuff leak.  Gertie Broerman 08/02/2021, 10:02 AM

## 2021-08-02 NOTE — Progress Notes (Addendum)
Pt 's daughter -in law relayed to me that patient has been on chronic narcotics for a long time and is concerned that her restlessness and delirium could be  withdrawal. Upon reviewing patient chart for home medications, patient is currently on  tylenol with codeine. Currently patient  has periods of restlessness and sleepiness.Sunday Corn turned off and alarm volumes reduced to facilitate sleep.

## 2021-08-02 NOTE — Progress Notes (Signed)
Pt back in rapid afib HR maintaining in 140'- 150's. Milrinone stopped. MD made aware.

## 2021-08-02 NOTE — Progress Notes (Signed)
ABG without hypercapnia, extubting to bipap. She developed Afib with RVR on milrinone at the time pf extubation. No respiratory distress of change in resirations on BiPAP. Milrinone held, no change in HR. Amiodarone bolus + infusion started.  Son Masiah Lewing updated via phone. He confirmed reintubation if she fails. At this point he would keep her DNR in the event of a cardiac arrest.  Julian Hy, DO 08/02/21 10:07 AM [facility]

## 2021-08-02 NOTE — Progress Notes (Signed)
Afib with RVR quickly twice today when milrinone started. Second attempted was after being back in sinus rhythm and on amiodarone for several hours. Hypertensive this afternoon and additional low dose of metoprolol given to improve HR.  Hydralazine for HTN.  Julian Hy, DO 08/02/21 6:57 PM [facility]

## 2021-08-02 NOTE — Progress Notes (Signed)
Right after extubation, pt went into rapid afib rates up to 189. Sustained  in 150's 160's. Milrinone stopped. CCM and pharm D at bedside, see new orders for amio boluses and amio gtt.   Lucius Conn, RN

## 2021-08-02 NOTE — Progress Notes (Addendum)
Advanced Heart Failure Rounding Note  PCP-Cardiologist: None   Subjective:   07/31/21 Shock . IABP/swan  placed. Intubated.   Remains on IABP at 1:1.   Awake on vent. Wants tube out. Denies pain.   PLTs continue to drop 212->153->130-> 92. No bleeding  Scr improving 3.4 -> 2.6  Swan RA 10 PA 37/20 Thermo 6.5/3.6 SVR 1277 Co-ox 75%  Objective:   Weight Range: 68.5 kg Body mass index is 23.65 kg/m.   Vital Signs:   Temp:  [98.8 F (37.1 C)-100 F (37.8 C)] 100 F (37.8 C) (10/22 0600) Pulse Rate:  [64-124] 101 (10/22 0600) Resp:  [0-33] 16 (10/22 0600) BP: (97-122)/(67-95) 122/95 (10/22 0600) SpO2:  [81 %-100 %] 97 % (10/22 0600) FiO2 (%):  [40 %-50 %] 40 % (10/22 0246) Weight:  [68.5 kg] 68.5 kg (10/22 0600) Last BM Date: 07/30/21  Weight change: Filed Weights   07/31/21 1048 08/01/21 0353 08/02/21 0600  Weight: 77.7 kg 70.2 kg 68.5 kg    Intake/Output:   Intake/Output Summary (Last 24 hours) at 08/02/2021 0649 Last data filed at 08/02/2021 0600 Gross per 24 hour  Intake 571.13 ml  Output 3425 ml  Net -2853.87 ml       Physical Exam    General:  Awake on vent  HEENT: normal Neck: supple.RIJ swan  Carotids 2+ bilat; no bruits. No lymphadenopathy or thryomegaly appreciated. Cor: PMI nondisplaced. Regular rate & rhythm. 2/6 MR Lungs: clear Abdomen: soft, nontender, nondistended. No hepatosplenomegaly. No bruits or masses. Good bowel sounds. Extremities: no cyanosis, clubbing, rash, edema  RFA IABP site ok  Neuro: alert & orientedx3, cranial nerves grossly intact. moves all 4 extremities w/o difficulty. Affect pleasant  Telemetry   SR 90-100 + PACs Personally reviewed  Labs    CBC Recent Labs    07/30/21 1351 07/31/21 0453 08/01/21 0339 08/02/21 0500  WBC 20.4*   < > 15.3* 10.4  NEUTROABS 12.8*  --   --   --   HGB 12.2   < > 9.8* 9.6*  HCT 35.7*   < > 31.1* 30.8*  MCV 93.0   < > 100.3* 99.4  PLT 212   < > 130* 92*   < > =  values in this interval not displayed.    Basic Metabolic Panel Recent Labs    16/59/45 0339 08/02/21 0500  NA 139 142  K 4.4 3.7  CL 107 111  CO2 19* 21*  GLUCOSE 105* 106*  BUN 72* 68*  CREATININE 3.40* 2.64*  CALCIUM 8.3* 8.2*  MG  --  2.1    Liver Function Tests Recent Labs    07/31/21 0453 08/01/21 0339  AST 79* 51*  ALT 30 26  ALKPHOS 51 48  BILITOT 1.0 0.8  PROT 6.3* 5.2*  ALBUMIN 3.6 2.7*    No results for input(s): LIPASE, AMYLASE in the last 72 hours. Cardiac Enzymes No results for input(s): CKTOTAL, CKMB, CKMBINDEX, TROPONINI in the last 72 hours.  BNP: BNP (last 3 results) Recent Labs    07/30/21 1945  BNP 2,001.3*     ProBNP (last 3 results) No results for input(s): PROBNP in the last 8760 hours.   D-Dimer No results for input(s): DDIMER in the last 72 hours. Hemoglobin A1C Recent Labs    08/01/21 0339  HGBA1C 5.2    Fasting Lipid Panel Recent Labs    08/01/21 0339  TRIG 145    Thyroid Function Tests Recent Labs    07/30/21 1450  TSH 1.050     Other results:   Imaging    DG CHEST PORT 1 VIEW  Result Date: 08/01/2021 CLINICAL DATA:  Hypoxia R09.02 (ICD-10-CM) EXAM: PORTABLE CHEST 1 VIEW COMPARISON:  July 31, 2021. FINDINGS: Right IJ Swan-Ganz catheter with the tip projecting in the expected region of the distal right pulmonary artery or proximal descending interlobar artery. Gastric tube courses below the diaphragm with the tip outside the field of view. Endotracheal tube tip at the level of clavicular heads. Intra or a balloon pump marker low the aortic knob in the region of the AP window. Presumed shunt catheter traverses the right neck and chest, extending outside the field of view. Slightly increased dense left basilar opacity. Proved patchy right lung opacities. Cardiomediastinal silhouette is within normal limits. Partially imaged ACDF. IMPRESSION: 1. Slightly increased dense left basilar atelectasis or  consolidation. 2. Improved patchy right lung opacities. 3. Support devices as detailed above. Electronically Signed   By: Margaretha Sheffield M.D.   On: 08/01/2021 11:12     Medications:     Scheduled Medications:  albuterol  2.5 mg Nebulization Q6H   aspirin  81 mg Per Tube Daily   atorvastatin  80 mg Per Tube Daily   chlorhexidine gluconate (MEDLINE KIT)  15 mL Mouth Rinse BID   Chlorhexidine Gluconate Cloth  6 each Topical Daily   docusate  100 mg Per Tube BID   fentaNYL (SUBLIMAZE) injection  25 mcg Intravenous Once   insulin aspart  1-3 Units Subcutaneous Q4H   mouth rinse  15 mL Mouth Rinse 10 times per day   multivitamin with minerals  1 tablet Per Tube Daily   pantoprazole sodium  40 mg Per Tube Daily   polyethylene glycol  17 g Per Tube Daily   predniSONE  2 mg Per Tube Daily   sodium chloride flush  10-40 mL Intracatheter Q12H   sodium chloride flush  3 mL Intravenous Q12H   vancomycin variable dose per unstable renal function (pharmacist dosing)   Does not apply See admin instructions    Infusions:  ceFEPime (MAXIPIME) IV Stopped (08/01/21 1654)   fentaNYL infusion INTRAVENOUS Stopped (08/02/21 0412)   heparin 750 Units/hr (08/02/21 0600)    PRN Medications: acetaminophen **OR** acetaminophen, fentaNYL, midazolam, polyethylene glycol, sodium chloride flush    Patient Profile   80 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 PMH h/o normal pressure hydrocephalus s/p stent, HDL, RA and former smoker.   Shock --. Cardiogenic +/- septic. Severe MR. IABP palced   Assessment/Plan   1. Mitral regurgitation: TEE showed severe, eccentric posteriorly-directed mitral regurgitation.  There was prolapse/partial flail of the A2 segment of the anterior leaflet.  There was also restriction of the posterior leaflet.  The left atrium was moderate to severely dilated, so I think that there was some degree of MR prior to the acute event.  I suspect that there was  pre-existing prolapse/partial flail of A2 and patient had NSTEMI involving LCx territory, leading to lateral wall motion abnormality and restriction of the posterior leaflet with acute worsening of the mitral regurgitation. TEE does not show definite evidence for papillary muscle rupture.  Suspect that the cause of her cardiogenic shock is acute worsening of mitral regurgitation. Not surgical candidate. Hemodynamics and end-organ perfusion improved with IABP. Swan numbers look good - Tentative plan for MitarClip on Monday with Dr. Ali Lowe - Continue IABP for now. Watch PLTs 2. AKI: Baseline creatinine < 1, 3.8 -> 2.6 today.  Suspect this was due to cardiogenic shock/ATN with acute worsening of mitral regurgitation. Renal function improving today. Creatinine trending down 3.8>3.4 > 2.6 3. Acute diastolic CHF/cardiogenic shock: In setting of suspected acute on chronic mitral regurgitation and NSTEMI.  Elevated filling pressures on RHC, especially high PCWP (though surprisingly, she did not have prominent v-waves).   - Management with IABP for afterload reduction.  - Hemodynamics improved with IABP. Volume status improved. Will hold off on diuretics today. No need for milrinone at this time 4. Rheumatoid arthritis: On prednisone 2 mg daily, Enbrel, and leflunomide at home. Need to consider risk of adrenal insufficiency off prednisone though she was on low dose.  Would ideally avoid stress dose steroids if possible.  5. Acute hypoxemic respiratory failure: Now intubated.  Pulmonary edema due to MR.   - CCM following.  6. ID: Possible septic component PCT 1.92 - For now, with critical illness, covering with vancomycin/cefepime. - Bld Cx NGTD.  - urine cx pending. WBC trending down.   7. CAD: Out of hospital NSTEMI with HS-TnI up to about 16,000.  Inferolateral wall motion abnormality suggests LCx territory MI.  Suspect that there is a component of acute infarct-related mitral regurgition.  - No cath at  this time with AKI.  - ASA 81, statin.  - Heparin gtt as above.  9. Acute Hypoxemic Respiratory Failure - Intubated 10/20. Hopefully can extubate later today. Will d/w CM 10. Thrombocytopenia: Platelets 130-> 92K today, drop from yesterday. ?Related to IABP, just started heparin last night.   - Watch CBC closely. No bleeding currently   Addendum: I spoke to Dr. Carlis Abbott in [place]. Will plan extubation today but has some concern over worsening MR/pulmonary pressures with extubation. Will add low dose milrinone.  CRITICAL CARE Performed by: Glori Bickers  Total critical care time: 35 minutes  Critical care time was exclusive of separately billable procedures and treating other patients.  Critical care was necessary to treat or prevent imminent or life-threatening deterioration.  Critical care was time spent personally by me (independent of midlevel providers or residents) on the following activities: development of treatment plan with patient and/or surrogate as well as nursing, discussions with consultants, evaluation of patient's response to treatment, examination of patient, obtaining history from patient or surrogate, ordering and performing treatments and interventions, ordering and review of laboratory studies, ordering and review of radiographic studies, pulse oximetry and re-evaluation of patient's condition.   Length of Stay: 3  Glori Bickers, MD  08/02/2021, 6:49 AM  Advanced Heart Failure Team Pager [phone] (M-F; 7a - 5p)  Please contact [facility] for night-coverage after hours (5p -7a ) and weekends on [email or web address]

## 2021-08-02 NOTE — Progress Notes (Signed)
NAME:  [name], MRN:  [id number], DOB:  February 01, 1941, LOS: 3 ADMISSION DATE:  07/30/2021, CONSULTATION DATE:  07/31/21 REFERRING MD:  Dr. Beryl Meager, CHIEF COMPLAINT:  Weakness   History of Present Illness:  80 y/o F who presented to [facility] on 10/19 with reports of weakness.   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  She reported on presentation that she had been feeling increasingly weak over the preceding four days.  She went to get a flu shot two days ago and seemed somewhat altered per family.  The weakness progressed to the point she could not sit or stand on her own which is a marked change from her baseline.  In the ER she reported shortness of breath & vague abdominal pain prior to presentation.  Initial ER evaluation notable for SBP 80-100's and new O2 need of 2-6L.  She was transitioned to BiPAP support after IVF.  Labs showed Na 133, Bicarb 18, BUN 62, Cr 4.43 (baseline 0.7), AST 112, WBC 20.4, troponin of 16, 652, lactic acid 2.62.  She was negative for COVID and influenza.  CXR showed small left pleural effusion, mild atelectasis on right & bibasilar atelectasis.  EKG showed ST changes thought to be non-specific.  She was admitted per [facility] for further work up with working diagnosis of possible PNA / infectious etiology of weakness.  She had improvement in BP and lactate with IVF + abx.  Subsequent ECHO showed an LVEF of 60-65%, LV with regional wall motion abnormalities with mid inferolateral hypokinesis, grade II diastolic dysfunction, RVSP of 63.7, LA severely dilated, RA mildly dilated, question of flail mitral valve.  ECHO findings concerning for MI with papillary muscle rupture and severe MR.  She remained on BiPAP for shortness of breath.     PCCM consulted for pulmonary evaluation.   Pertinent  Medical History  Degenerative Disease  Normal Pressure Hydrocephalus s/p Stent Neuropathy  Vertigo  HLD Depression  Former Smoker - smoked total 20 years RA  - on leflunomide, prednisone  Retired Therapist, sports   Significant Hospital Events: Including procedures, antibiotic start and stop dates in addition to other pertinent events   10/19 Admit with weakness, SOB, elevated troponin  10/20 PCCM consulted, intubated, TEE, IABP, Swan 10/21 failed SBT due to lack of respiratory drive  Interim History / Subjective:  Agitated overnight. Sedation off since 4AM. She denies complaints.  Objective   Blood pressure (!) 122/95, pulse (!) 101, temperature 100 F (37.8 C), resp. rate 16, height 5\' 7"  (1.702 m), weight 68.5 kg, SpO2 97 %. PAP: (28-45)/(13-29) 37/20 CVP:  [3 mmHg-27 mmHg] 11 mmHg PCWP:  [9 mmHg-16 mmHg] 9 mmHg CO:  [6.5 L/min-8.6 L/min] 6.7 L/min CI:  [3.4 L/min/m2-4.7 L/min/m2] 3.7 L/min/m2  Vent Mode: PRVC FiO2 (%):  [40 %-50 %] 40 % Set Rate:  [16 bmp-18 bmp] 16 bmp Vt Set:  [490 mL] 490 mL PEEP:  [5 cmH20] 5 cmH20 Plateau Pressure:  [14 cmH20-25 cmH20] 14 cmH20   Intake/Output Summary (Last 24 hours) at 08/02/2021 0700 Last data filed at 08/02/2021 0600 Gross per 24 hour  Intake 539.08 ml  Output 3365 ml  Net -2825.92 ml    Filed Weights   07/31/21 1048 08/01/21 0353 08/02/21 0600  Weight: 77.7 kg 70.2 kg 68.5 kg    Examination: General: ill appearing woman lying in bed in NAD HENT:[place]/AT, eyes anicteric Lungs: bradypneic on SBT, breathing 3-5 times per minute with Vt 1-1.5L on PS  5 + CPAP 5. Rhonchi cleared by suctioning, clear secretions Cardiovascular:  S1S2, RRR Abdomen: soft, NT Extremities: LLE IABP, no edema or cyanosis Neuro: awake, alert, moving all extremities on command,   BUN 68 Cr 2.64 PCT 1.92 WBC 10.4 Coox 74.6% Blood culture, 2 sets> NGTD CXR 8/21> no pulm edema, possibly small dependent layering effusions Vanc 14  Resolved Hospital Problem list     Assessment & Plan:   Suspected silent recent MI with papillary muscle rupture, Severe MR Acute Heart Failure  Wall motion abnormalities on ECHO,  progression to severe MR findings worrisome for papillary muscle rupture.  Baseline MV disease but progression noted on ECHO.  -IABP per cardiology -adding milrinone today for afterload and PVR reduction -Mitral clip on Monday -blood cx NGTD; con't broad antibiotics given immunosuppression history and elevated PCT  Acute hypoxic respiratory failure  Acute pulmonary edema Left pleural effusion with atelectasis  In setting of edema, atelectasis.  -LTVV, 4-8cc/kg IBW with goal Pplat<30 and DP<15 -off sedation since 4AM, SBT; ABG given bradypnea-- improved to around 10/ min. Would extubate to bipap given concern for pulm edema. - VAP prevention protocol - High risk for recurrent pulm edema with loss of PP with her valve unrepaired. Family ok with reintubation over the weekend since she will be reintubated Monday anyway for her procedure.  AKI, improving Baseline renal function normal.  Suspect pre-renal.   -strict I/Os -renally dose meds, avoid nephrotoxic meds - con't to monitor -diuresis overnight  Elevated LFT's  2/2 congestive hepatopathy.  -monitor  Leukocytosis  Immune suppressed  Doubt infectious etiology, more likely stress response from MI.  Note immune suppression with RA therapy-leflunomide, low-dose prednisone, Enbrel.  -follow blood cultures until finalized -con't antibiotics  Hx HTN -Continue holding home meds  Rheumatoid arthritis  -continue low-dose prednisone; may need stress dose steroids if she demonstrates signs of adrenal insufficiency -Hold leflunomide and Enbrel  Normal pressure hydrocephalus  Hx shunt. CT head with shunt in place, slight enlargement of ventricles.  CT Abd/Pelvis with small amt free fluid in abdomen.  -No intervention needed  Hyperglycemia, A1c 5.2. No insulin needs overnight.  -Continue to monitor - Goal BG less than 180   Husband updated at bedside today. Sons both updated at bedside yesterday and were fine with reintubation if she  failed extubation.  Best Practice (right click and "Reselect all SmartList Selections" daily)  Diet/type: NPO DVT prophylaxis: prophylactic heparin  GI prophylaxis: N/A Lines: N/A Foley:  N/A Code Status:  DNR.  Patient accepting of short term intubation if needed for procedure.  Last date of multidisciplinary goals of care discussion: family updated 10/20 at bedside on plan of care.   Labs   CBC: Recent Labs  Lab 07/30/21 1351 07/31/21 0453 07/31/21 1556 07/31/21 1842 07/31/21 1843 08/01/21 0339 08/02/21 0500  WBC 20.4* 17.9*  --   --   --  15.3* 10.4  NEUTROABS 12.8*  --   --   --   --   --   --   HGB 12.2 10.9* 11.6* 10.5* 11.2* 9.8* 9.6*  HCT 35.7* 33.7* 34.0* 31.0* 33.0* 31.1* 30.8*  MCV 93.0 96.6  --   --   --  100.3* 99.4  PLT 212 153  --   --   --  130* 92*     Basic Metabolic Panel: Recent Labs  Lab 07/30/21 1351 07/31/21 0453 07/31/21 1556 07/31/21 1842 07/31/21 1843 08/01/21 0339 08/02/21 0500  NA 133* 135 137 138 138 139  142  K 4.4 4.2 4.1 4.3 4.3 4.4 3.7  CL 102 104  --   --   --  107 111  CO2 18* 18*  --   --   --  19* 21*  GLUCOSE 136* 133*  --   --   --  105* 106*  BUN 62* 60*  --   --   --  72* 68*  CREATININE 4.43* 3.81*  --   --   --  3.40* 2.64*  CALCIUM 9.5 8.8*  --   --   --  8.3* 8.2*  MG  --   --   --   --   --   --  2.1    GFR: Estimated Creatinine Clearance: 16.3 mL/min (A) (by C-G formula based on SCr of 2.64 mg/dL (H)). Recent Labs  Lab 07/30/21 1351 07/30/21 1550 07/31/21 0453 07/31/21 1323 07/31/21 2113 08/01/21 0339 08/02/21 0500  PROCALCITON  --   --   --  1.66  --  2.25 1.92  WBC 20.4*  --  17.9*  --   --  15.3* 10.4  LATICACIDVEN 2.6* 2.0*  --   --  1.2  --   --      Liver Function Tests: Recent Labs  Lab 07/30/21 1351 07/31/21 0453 08/01/21 0339  AST 112* 79* 51*  ALT 31 30 26   ALKPHOS 58 51 48  BILITOT 1.0 1.0 0.8  PROT 6.9 6.3* 5.2*  ALBUMIN 4.2 3.6 2.7*    No results for input(s): LIPASE, AMYLASE  in the last 168 hours. No results for input(s): AMMONIA in the last 168 hours.  ABG    Component Value Date/Time   PHART 7.347 (L) 07/31/2021 1556   PCO2ART 35.7 07/31/2021 1556   PO2ART 199 (H) 07/31/2021 1556   HCO3 21.5 07/31/2021 1843   TCO2 23 07/31/2021 1843   ACIDBASEDEF 6.0 (H) 07/31/2021 1843   O2SAT 74.6 08/02/2021 0445     This patient is critically ill with multiple organ system failure which requires frequent high complexity decision making, assessment, support, evaluation, and titration of therapies. This was completed through the application of advanced monitoring technologies and extensive interpretation of multiple databases. During this encounter critical care time was devoted to patient care services described in this note for 40 minutes.  Julian Hy, DO 08/02/21 9:26 AM [facility]

## 2021-08-02 NOTE — Progress Notes (Signed)
Pt confused and ripping off bipap. Mittens applied.

## 2021-08-02 NOTE — Progress Notes (Signed)
[facility] Progress Note Patient Name: [name] DOB: [birth year] MRN: [id number]   Date of Service  08/02/2021  HPI/Events of Note  Remains in AFIB with RVR - Ventricular rate = 110 to 120's. K+ = 3.4, Mg++ = 2.2 and Creatinine = 2.34.  eICU Interventions  Plan: Replace K+. Amiodarone 150 mg IV over 10 minutes now.      Intervention Category Major Interventions: Arrhythmia - evaluation and management  [name] 08/02/2021, 11:29 PM

## 2021-08-02 NOTE — Progress Notes (Signed)
Pt converted to NSR. MD made aware. HR 85, BP 162/50,. Pt with eyes closed resting. No family at bedside. Monitoring closely.  Lucius Conn, RN

## 2021-08-02 NOTE — Progress Notes (Signed)
[facility] Progress Note Patient Name: [name] DOB: 1940-11-13 MRN: [id number]   Date of Service  08/02/2021  HPI/Events of Note  AFIB with RVR - Ventricular rate = 130-140. Currently on Amiodarone IV infusion.   eICU Interventions  Plan: Amiodarone 150 mg IV over 10 minutes now. BMP and Mg++ level STAT.     Intervention Category Major Interventions: Arrhythmia - evaluation and management  Darian Ace Cornelia Copa 08/02/2021, 8:33 PM

## 2021-08-02 NOTE — Progress Notes (Signed)
Paged CCM as patients transduced arterial pressure maintaining in 160's-mid 170's. Cuff pressures SBP 130-140's. Augmented BP 161. MD order to restart Milrinone at 0.125. Pt still in NSR HR 90. Pt tolerating Bipap fairly well. Pt shakes head "NO" when asked if she is in pain. Pt shakes her head "NO" when asked if she is anxious. Current CO is 7.36, CI is 4.11. Monitoring closely.   Lucius Conn, RN

## 2021-08-02 NOTE — Progress Notes (Signed)
Amio bolus of 150mg  given

## 2021-08-03 ENCOUNTER — Inpatient Hospital Stay (HOSPITAL_COMMUNITY): Payer: Medicare Other

## 2021-08-03 DIAGNOSIS — I340 Nonrheumatic mitral (valve) insufficiency: Secondary | ICD-10-CM | POA: Diagnosis not present

## 2021-08-03 DIAGNOSIS — I10 Essential (primary) hypertension: Secondary | ICD-10-CM

## 2021-08-03 LAB — COOXEMETRY PANEL
Carboxyhemoglobin: 0.7 % (ref 0.5–1.5)
Methemoglobin: 0.9 % (ref 0.0–1.5)
O2 Saturation: 76.9 %
Total hemoglobin: 10.4 g/dL — ABNORMAL LOW (ref 12.0–16.0)

## 2021-08-03 LAB — CBC
HCT: 30.2 % — ABNORMAL LOW (ref 36.0–46.0)
Hemoglobin: 9.8 g/dL — ABNORMAL LOW (ref 12.0–15.0)
MCH: 31.6 pg (ref 26.0–34.0)
MCHC: 32.5 g/dL (ref 30.0–36.0)
MCV: 97.4 fL (ref 80.0–100.0)
Platelets: 77 10*3/uL — ABNORMAL LOW (ref 150–400)
RBC: 3.1 MIL/uL — ABNORMAL LOW (ref 3.87–5.11)
RDW: 13.6 % (ref 11.5–15.5)
WBC: 12.9 10*3/uL — ABNORMAL HIGH (ref 4.0–10.5)
nRBC: 0 % (ref 0.0–0.2)

## 2021-08-03 LAB — POCT I-STAT 7, (LYTES, BLD GAS, ICA,H+H)
Acid-base deficit: 4 mmol/L — ABNORMAL HIGH (ref 0.0–2.0)
Bicarbonate: 21 mmol/L (ref 20.0–28.0)
Calcium, Ion: 1.1 mmol/L — ABNORMAL LOW (ref 1.15–1.40)
HCT: 30 % — ABNORMAL LOW (ref 36.0–46.0)
Hemoglobin: 10.2 g/dL — ABNORMAL LOW (ref 12.0–15.0)
O2 Saturation: 96 %
Patient temperature: 37.6
Potassium: 3.6 mmol/L (ref 3.5–5.1)
Sodium: 142 mmol/L (ref 135–145)
TCO2: 22 mmol/L (ref 22–32)
pCO2 arterial: 35.7 mmHg (ref 32.0–48.0)
pH, Arterial: 7.38 (ref 7.350–7.450)
pO2, Arterial: 88 mmHg (ref 83.0–108.0)

## 2021-08-03 LAB — GLUCOSE, CAPILLARY
Glucose-Capillary: 111 mg/dL — ABNORMAL HIGH (ref 70–99)
Glucose-Capillary: 113 mg/dL — ABNORMAL HIGH (ref 70–99)
Glucose-Capillary: 113 mg/dL — ABNORMAL HIGH (ref 70–99)
Glucose-Capillary: 114 mg/dL — ABNORMAL HIGH (ref 70–99)
Glucose-Capillary: 115 mg/dL — ABNORMAL HIGH (ref 70–99)
Glucose-Capillary: 133 mg/dL — ABNORMAL HIGH (ref 70–99)
Glucose-Capillary: 152 mg/dL — ABNORMAL HIGH (ref 70–99)
Glucose-Capillary: 158 mg/dL — ABNORMAL HIGH (ref 70–99)

## 2021-08-03 LAB — TYPE AND SCREEN
ABO/RH(D): A NEG
Antibody Screen: NEGATIVE

## 2021-08-03 LAB — HEPARIN LEVEL (UNFRACTIONATED): Heparin Unfractionated: 0.34 IU/mL (ref 0.30–0.70)

## 2021-08-03 LAB — BASIC METABOLIC PANEL
Anion gap: 10 (ref 5–15)
BUN: 56 mg/dL — ABNORMAL HIGH (ref 8–23)
CO2: 22 mmol/L (ref 22–32)
Calcium: 8.5 mg/dL — ABNORMAL LOW (ref 8.9–10.3)
Chloride: 111 mmol/L (ref 98–111)
Creatinine, Ser: 2.14 mg/dL — ABNORMAL HIGH (ref 0.44–1.00)
GFR, Estimated: 23 mL/min — ABNORMAL LOW (ref >60)
Glucose, Bld: 120 mg/dL — ABNORMAL HIGH (ref 70–99)
Potassium: 3.8 mmol/L (ref 3.5–5.1)
Sodium: 143 mmol/L (ref 135–145)

## 2021-08-03 LAB — PROTIME-INR
INR: 1.2 (ref 0.8–1.2)
Prothrombin Time: 15.4 seconds — ABNORMAL HIGH (ref 11.4–15.2)

## 2021-08-03 LAB — MAGNESIUM: Magnesium: 2.2 mg/dL (ref 1.7–2.4)

## 2021-08-03 MED ORDER — ASPIRIN 81 MG PO CHEW
81.0000 mg | CHEWABLE_TABLET | Freq: Every day | ORAL | Status: DC
  Administered 2021-08-03 – 2021-08-04 (×2): 81 mg via ORAL
  Filled 2021-08-03 (×2): qty 1

## 2021-08-03 MED ORDER — ATORVASTATIN CALCIUM 80 MG PO TABS
80.0000 mg | ORAL_TABLET | Freq: Every day | ORAL | Status: DC
  Administered 2021-08-03 – 2021-08-09 (×7): 80 mg via ORAL
  Filled 2021-08-03 (×7): qty 1

## 2021-08-03 MED ORDER — AMIODARONE LOAD VIA INFUSION
150.0000 mg | Freq: Once | INTRAVENOUS | Status: AC
  Administered 2021-08-03: 150 mg via INTRAVENOUS
  Filled 2021-08-03: qty 83.34

## 2021-08-03 MED ORDER — ALBUTEROL SULFATE (2.5 MG/3ML) 0.083% IN NEBU
2.5000 mg | INHALATION_SOLUTION | Freq: Three times a day (TID) | RESPIRATORY_TRACT | Status: DC
  Administered 2021-08-03: 2.5 mg via RESPIRATORY_TRACT
  Filled 2021-08-03: qty 3

## 2021-08-03 MED ORDER — POTASSIUM CHLORIDE CRYS ER 20 MEQ PO TBCR
20.0000 meq | EXTENDED_RELEASE_TABLET | Freq: Once | ORAL | Status: AC
  Administered 2021-08-03: 20 meq via ORAL
  Filled 2021-08-03: qty 1

## 2021-08-03 MED ORDER — ALBUTEROL SULFATE (2.5 MG/3ML) 0.083% IN NEBU: 2.5000 mg | INHALATION_SOLUTION | Freq: Four times a day (QID) | RESPIRATORY_TRACT | Status: DC | PRN

## 2021-08-03 MED ORDER — HYDRALAZINE HCL 25 MG PO TABS
25.0000 mg | ORAL_TABLET | Freq: Three times a day (TID) | ORAL | Status: DC
  Administered 2021-08-03 – 2021-08-05 (×6): 25 mg via ORAL
  Filled 2021-08-03 (×7): qty 1

## 2021-08-03 MED ORDER — DOCUSATE SODIUM 100 MG PO CAPS
100.0000 mg | ORAL_CAPSULE | Freq: Two times a day (BID) | ORAL | Status: DC
  Administered 2021-08-03 – 2021-08-08 (×5): 100 mg via ORAL
  Filled 2021-08-03 (×7): qty 1

## 2021-08-03 MED ORDER — METOPROLOL TARTRATE 5 MG/5ML IV SOLN
2.5000 mg | Freq: Once | INTRAVENOUS | Status: AC
  Administered 2021-08-03: 2.5 mg via INTRAVENOUS
  Filled 2021-08-03: qty 5

## 2021-08-03 MED ORDER — TEMAZEPAM 15 MG PO CAPS
15.0000 mg | ORAL_CAPSULE | Freq: Once | ORAL | Status: AC | PRN
Start: 2021-08-03 — End: 2021-08-04
  Administered 2021-08-04: 15 mg via ORAL
  Filled 2021-08-03: qty 1

## 2021-08-03 MED ORDER — METOPROLOL TARTRATE 12.5 MG HALF TABLET
12.5000 mg | ORAL_TABLET | Freq: Two times a day (BID) | ORAL | Status: DC
  Administered 2021-08-03 – 2021-08-04 (×4): 12.5 mg via ORAL
  Filled 2021-08-03 (×4): qty 1

## 2021-08-03 MED ORDER — FUROSEMIDE 10 MG/ML IJ SOLN
40.0000 mg | Freq: Once | INTRAMUSCULAR | Status: AC
  Administered 2021-08-03: 40 mg via INTRAVENOUS
  Filled 2021-08-03: qty 4

## 2021-08-03 MED ORDER — POLYETHYLENE GLYCOL 3350 17 G PO PACK
17.0000 g | PACK | Freq: Every day | ORAL | Status: DC
  Administered 2021-08-03: 17 g via ORAL
  Filled 2021-08-03 (×2): qty 1

## 2021-08-03 MED ORDER — POLYETHYLENE GLYCOL 3350 17 G PO PACK: 17.0000 g | PACK | Freq: Every day | ORAL | Status: DC | PRN

## 2021-08-03 MED ORDER — BISACODYL 5 MG PO TBEC: 5.0000 mg | DELAYED_RELEASE_TABLET | Freq: Once | ORAL | Status: DC

## 2021-08-03 MED ORDER — ADULT MULTIVITAMIN W/MINERALS CH
1.0000 | ORAL_TABLET | Freq: Every day | ORAL | Status: DC
  Administered 2021-08-03 – 2021-08-09 (×6): 1 via ORAL
  Filled 2021-08-03 (×6): qty 1

## 2021-08-03 MED ORDER — PREDNISONE 1 MG PO TABS
2.0000 mg | ORAL_TABLET | Freq: Every day | ORAL | Status: DC
  Administered 2021-08-03 – 2021-08-09 (×7): 2 mg via ORAL
  Filled 2021-08-03 (×7): qty 2

## 2021-08-03 NOTE — Progress Notes (Signed)
Advanced Heart Failure Rounding Note  PCP-Cardiologist: None   Subjective:   07/31/21 Shock . IABP/swan  placed. Intubated.  10/22 Extubated  Remains on IABP at 1:1.   Extubated yesterday. Started on milrinone to help with extubation but developed AFL with RVR that did not respond to amio. Responded well to IV metoprolol but then re-challenged with milrinone and developed recurrent AFL. Milrinone off. Still in AF on milrinone  Mildly confused at times  but clear for me. Softspoken. Denies CP, SOB, orthopnea or PND   SCr improving  PLTs 130 -> 92 -> 77k  Swan RA 8-10 PA 31/21 Thermo 5.9/3.3 Co-ox 77%  Objective:   Weight Range: 70.2 kg Body mass index is 24.24 kg/m.   Vital Signs:   Temp:  [98.8 F (37.1 C)-99.7 F (37.6 C)] 99.1 F (37.3 C) (10/23 0900) Pulse Rate:  [78-209] 117 (10/23 0900) Resp:  [8-35] 28 (10/23 0900) BP: (81-195)/(48-179) 131/80 (10/23 0900) SpO2:  [79 %-100 %] 95 % (10/23 0900) FiO2 (%):  [40 %] 40 % (10/23 0055) Weight:  [70.2 kg] 70.2 kg (10/23 0600) Last BM Date: 07/30/21  Weight change: Filed Weights   08/01/21 0353 08/02/21 0600 08/03/21 0600  Weight: 70.2 kg 68.5 kg 70.2 kg    Intake/Output:   Intake/Output Summary (Last 24 hours) at 08/03/2021 1005 Last data filed at 08/03/2021 0900 Gross per 24 hour  Intake 1325.89 ml  Output 2440 ml  Net -1114.11 ml       Physical Exam    General:  Lying in bedNo resp difficulty HEENT: normal Neck: supple.RIJ swan. Carotids 2+ bilat; no bruits. No lymphadenopathy or thryomegaly appreciated. Cor: PMI nondisplaced. irregular rate & rhythm. 2/6 MR. Lungs: clear Abdomen: soft, nontender, nondistended. No hepatosplenomegaly. No bruits or masses. Good bowel sounds. Extremities: no cyanosis, clubbing, rash, tr edema  RFA IABP  Neuro: alert. Mildly confused at times cranial nerves grossly intact. moves all 4 extremities w/o difficulty. Affect pleasant   Telemetry   AFL 110-120  Personally reviewed  Labs    CBC Recent Labs    08/02/21 0500 08/03/21 0500  WBC 10.4 12.9*  HGB 9.6* 9.8*  HCT 30.8* 30.2*  MCV 99.4 97.4  PLT 92* 77*    Basic Metabolic Panel Recent Labs    08/02/21 2033 08/03/21 0500  NA 142 143  K 3.4* 3.8  CL 110 111  CO2 21* 22  GLUCOSE 131* 120*  BUN 60* 56*  CREATININE 2.34* 2.14*  CALCIUM 8.5* 8.5*  MG 2.2 2.2    Liver Function Tests Recent Labs    08/01/21 0339  AST 51*  ALT 26  ALKPHOS 48  BILITOT 0.8  PROT 5.2*  ALBUMIN 2.7*    No results for input(s): LIPASE, AMYLASE in the last 72 hours. Cardiac Enzymes No results for input(s): CKTOTAL, CKMB, CKMBINDEX, TROPONINI in the last 72 hours.  BNP: BNP (last 3 results) Recent Labs    07/30/21 1945  BNP 2,001.3*     ProBNP (last 3 results) No results for input(s): PROBNP in the last 8760 hours.   D-Dimer No results for input(s): DDIMER in the last 72 hours. Hemoglobin A1C Recent Labs    08/01/21 0339  HGBA1C 5.2    Fasting Lipid Panel Recent Labs    08/01/21 0339  TRIG 145    Thyroid Function Tests No results for input(s): TSH, T4TOTAL, T3FREE, THYROIDAB in the last 72 hours.  Invalid input(s): FREET3   Other results:   Imaging  DG CHEST PORT 1 VIEW  Result Date: 08/03/2021 CLINICAL DATA:  Acute respiratory failure with hypoxia. EXAM: PORTABLE CHEST 1 VIEW COMPARISON:  08/02/2021 FINDINGS: Right IJ pulmonary arterial catheter is identified. This is been retracted and is now within the distal aspect of the right main pulmonary artery. Left IJ catheter is noted with tip in the projection of the SVC. Heart size and mediastinal contours are stable. Small left pleural effusion, unchanged. Diffuse pulmonary vascular congestion is again noted and appears unchanged. Persistent retrocardiac opacity in the left base may represent atelectasis or airspace disease. IMPRESSION: 1. Interval retraction of right IJ pulmonary arterial catheter with tip  now in the distal aspect of the right main pulmonary artery. 2. No change in aeration to the lungs. 3. Small left pleural effusion as before. Electronically Signed   By: Kerby Moors M.D.   On: 08/03/2021 09:27     Medications:     Scheduled Medications:  albuterol  2.5 mg Nebulization TID   aspirin  81 mg Oral Daily   atorvastatin  80 mg Oral Daily   chlorhexidine gluconate (MEDLINE KIT)  15 mL Mouth Rinse BID   Chlorhexidine Gluconate Cloth  6 each Topical Daily   docusate sodium  100 mg Oral BID   hydrALAZINE  25 mg Oral Q8H   insulin aspart  1-3 Units Subcutaneous Q4H   metoprolol tartrate  12.5 mg Oral BID   multivitamin with minerals  1 tablet Oral Daily   polyethylene glycol  17 g Oral Daily   predniSONE  2 mg Oral Daily   sodium chloride flush  10-40 mL Intracatheter Q12H   sodium chloride flush  3 mL Intravenous Q12H   vancomycin variable dose per unstable renal function (pharmacist dosing)   Does not apply See admin instructions    Infusions:  amiodarone 30 mg/hr (08/03/21 0900)   ceFEPime (MAXIPIME) IV Stopped (08/02/21 1624)   heparin 950 Units/hr (08/03/21 0900)    PRN Medications: acetaminophen **OR** acetaminophen, hydrALAZINE, polyethylene glycol, sodium chloride flush    Patient Profile   80 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 PMH h/o normal pressure hydrocephalus s/p stent, HDL, RA and former smoker.   Shock --. Cardiogenic +/- septic. Severe MR. IABP palced   Assessment/Plan   1. Mitral regurgitation: TEE showed severe, eccentric posteriorly-directed mitral regurgitation.  There was prolapse/partial flail of the A2 segment of the anterior leaflet.  There was also restriction of the posterior leaflet.  The left atrium was moderate to severely dilated, so I think that there was some degree of MR prior to the acute event.  Suspect that there was pre-existing prolapse/partial flail of A2 and patient had NSTEMI involving LCx territory,  leading to lateral wall motion abnormality and restriction of the posterior leaflet with acute worsening of the mitral regurgitation. TEE does not show definite evidence for papillary muscle rupture.  Suspect that the cause of her cardiogenic shock is acute worsening of mitral regurgitation. Not surgical candidate. Hemodynamics and end-organ perfusion improved with IABP. Hydralazine added for high MAPs  Did not tolerate milrinone due to AFL - Tentative plan for MitarClip with Dr. Ali Lowe tomorrow (he has seen this am) - Continue IABP for now. Watch PLTs - One dose lasix today 2. AKI: Baseline creatinine < 1.0, 3.8 -> 2.6 > 2.1 today.  Suspect this was due to cardiogenic shock/ATN with acute worsening of mitral regurgitation. Renal function improving with hemodynamic support 3. Acute diastolic CHF/cardiogenic shock: In setting of  suspected acute on chronic mitral regurgitation and NSTEMI.  Elevated filling pressures on RHC, especially high PCWP (though surprisingly, she did not have prominent v-waves).   - Management with IABP for afterload reduction.  - Hemodynamics improved with IABP. Volume status trending up. Will give one dose lasix today 4. Rheumatoid arthritis: On prednisone 2 mg daily, Enbrel, and leflunomide at home. Need to consider risk of adrenal insufficiency off prednisone though she was on low dose.  Would ideally avoid stress dose steroids if possible.  5. Acute hypoxemic respiratory failure: Now intubated.  Pulmonary edema due to MR.   - CCM following.  6. ID: Possible septic component PCT 1.92 WBC 15.3 -> 10.4 -> 12.9 - For now, with critical illness, covering with vancomycin/cefepime. - Bld Cx NGTD.  - urine cx negative  7. CAD: Out of hospital NSTEMI with HS-TnI up to about 16,000.  Inferolateral wall motion abnormality suggests LCx territory MI.  Suspect that there is a component of acute infarct-related mitral regurgition. No CP  - No cath at this time with AKI.  - ASA 81,  statin.  - Heparin gtt as above.  9. Acute Hypoxemic Respiratory Failure - Intubated 10/20.  - Extubated 10/22 CCM following 10. Thrombocytopenia: Platelets 130-> 92 -> 77K today ?Related to IABP - Watch CBC closely. 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  - Continue IV heparin and b-blocker.    CRITICAL CARE Performed by: Glori Bickers  Total critical care time: 40 minutes  Critical care time was exclusive of separately billable procedures and treating other patients.  Critical care was necessary to treat or prevent imminent or life-threatening deterioration.  Critical care was time spent personally by me (independent of midlevel providers or residents) on the following activities: development of treatment plan with patient and/or surrogate as well as nursing, discussions with consultants, evaluation of patient's response to treatment, examination of patient, obtaining history from patient or surrogate, ordering and performing treatments and interventions, ordering and review of laboratory studies, ordering and review of radiographic studies, pulse oximetry and re-evaluation of patient's condition.   Length of Stay: [place], MD  08/03/2021, 10:05 AM  Advanced Heart Failure Team Pager [phone] (M-F; 7a - 5p)  Please contact [facility] for night-coverage after hours (5p -7a ) and weekends on [email or web address]

## 2021-08-03 NOTE — Progress Notes (Signed)
NAME:  [name], MRN:  [id number], DOB:  [birth year], LOS: 4 ADMISSION DATE:  07/30/2021, CONSULTATION DATE:  07/31/21 REFERRING MD:  Dr. Beryl Meager, CHIEF COMPLAINT:  Weakness   History of Present Illness:  80 y/o F who presented to [facility] on 10/19 with reports of weakness.   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  She reported on presentation that she had been feeling increasingly weak over the preceding four days.  She went to get a flu shot two days ago and seemed somewhat altered per family.  The weakness progressed to the point she could not sit or stand on her own which is a marked change from her baseline.  In the ER she reported shortness of breath & vague abdominal pain prior to presentation.  Initial ER evaluation notable for SBP 80-100's and new O2 need of 2-6L.  She was transitioned to BiPAP support after IVF.  Labs showed Na 133, Bicarb 18, BUN 62, Cr 4.43 (baseline 0.7), AST 112, WBC 20.4, troponin of 16, 652, lactic acid 2.62.  She was negative for COVID and influenza.  CXR showed small left pleural effusion, mild atelectasis on right & bibasilar atelectasis.  EKG showed ST changes thought to be non-specific.  She was admitted per [facility] for further work up with working diagnosis of possible PNA / infectious etiology of weakness.  She had improvement in BP and lactate with IVF + abx.  Subsequent ECHO showed an LVEF of 60-65%, LV with regional wall motion abnormalities with mid inferolateral hypokinesis, grade II diastolic dysfunction, RVSP of 63.7, LA severely dilated, RA mildly dilated, question of flail mitral valve.  ECHO findings concerning for MI with papillary muscle rupture and severe MR.  She remained on BiPAP for shortness of breath.     PCCM consulted for pulmonary evaluation.   Pertinent  Medical History  Degenerative Disease  Normal Pressure Hydrocephalus s/p Stent Neuropathy  Vertigo  HLD Depression  Former Smoker - smoked total 20 years RA  - on leflunomide, prednisone  Retired Therapist, sports   Significant Hospital Events: Including procedures, antibiotic start and stop dates in addition to other pertinent events   10/19 Admit with weakness, SOB, elevated troponin  10/20 PCCM consulted, intubated, TEE, IABP, Swan 10/21 failed SBT due to lack of respiratory drive 69/48 extubated, Afib with RVR- on amio.  Interim History / Subjective:  Still tachy overnight, got amiodarone and metoprolol to improve HR.  Objective   Blood pressure (!) 128/104, pulse (!) 135, temperature 99.1 F (37.3 C), resp. rate 19, height 5\' 7"  (1.702 m), weight 70.2 kg, SpO2 98 %. PAP: (22-44)/(12-31) 31/22 CVP:  [1 mmHg-19 mmHg] 11 mmHg PCWP:  [13 mmHg-19 mmHg] 19 mmHg CO:  [4.3 L/min-7.4 L/min] 4.3 L/min CI:  [2.4 L/min/m2-4.1 L/min/m2] 2.4 L/min/m2  Vent Mode: BIPAP;PCV FiO2 (%):  [40 %] 40 % Set Rate:  [16 bmp-20 bmp] 20 bmp Vt Set:  [490 mL] 490 mL PEEP:  [5 cmH20] 5 cmH20 Plateau Pressure:  [15 cmH20] 15 cmH20   Intake/Output Summary (Last 24 hours) at 08/03/2021 0702 Last data filed at 08/03/2021 0600 Gross per 24 hour  Intake 1338.96 ml  Output 2740 ml  Net -1401.04 ml    Filed Weights   08/01/21 0353 08/02/21 0600 08/03/21 0600  Weight: 70.2 kg 68.5 kg 70.2 kg    Examination: General: ill appearing woman lying in bed in NAD HENT: [place]/AT, eyes anicteric Lungs: breathing comfortably on [place], rhales R>L,  reduced basilar breath sounds Cardiovascular:  S1S2, tachycardic, irreg rhythm, Afib Abdomen: soft NT Extremities: RLE IABP with normal distal perfusion, no cyanosis or edema Neuro: awake and alert, moving extremities and following commands. Oriented to person and place.  I/O -1.4 net -2.4L for admission  BUN 56 Cr 2.14 WBC 12.9 Coox 76.9% Blood culture, 2 sets> NGTD CXR 8/22> left lateral opacity silhouetting hemidiaphrgam- effusion vs pneumonia vs atelectasis. Pulm edema worse on the R  Resolved Hospital Problem list      Assessment & Plan:   Suspected silent recent MI with papillary muscle rupture Severe MR Acute HFpEF Wall motion abnormalities on ECHO, progression to severe MR findings worrisome for papillary muscle rupture.  Baseline MV disease but progression noted on ECHO.  -IABP per cardiology, con't 1:1. Con't heparin. -did not tolerate milrinone due to tachycardia -Mitral clip on Monday -blood cx NGTD; con't broad antibiotics given immunosuppression history and elevated PCT  New- onset Afib with RVR -amiodarone infusion; received 3 boluses in the past day -start low dose PO metoprolol to optimize HR control  Hypertension -hydralazine -additional hydralazine PRN  Acute hypoxic respiratory failure  Acute pulmonary edema Left pleural effusion with atelectasis  -supplemental O2 to maintain SpO >90% -BiPAP PRN -getting reintubated for procedure Monday  AKI, improving Baseline renal function normal.  Suspect pre-renal from heart failure.   -strict I/O -autodiuresing well -renally dose meds, avoid nephrotoxic meds - con't to monitor  Elevated LFT's 2/2 congestive hepatopathy.  -monitor  Leukocytosis  Immune suppressed  Doubt infectious etiology, more likely stress response from MI.  Note immune suppression with RA therapy-leflunomide, low-dose prednisone, Enbrel.  -follow blood cultures until finalized -con't antibiotics  Hx HTN -Continue holding home meds  Rheumatoid arthritis  -continue low-dose prednisone; may need stress dose steroids if she demonstrates signs of adrenal insufficiency -con't to hold leflunomide and Enbrel  Normal pressure hydrocephalus  Hx shunt. CT head with shunt in place, slight enlargement of ventricles.  CT Abd/Pelvis with small amt free fluid in abdomen.  -No intervention needed  Hyperglycemia, A1c 5.2. No insulin needs overnight.  -Continue to monitor - Goal BG less than 180 -SSI PRN  Thrombocytopenia due to IABP -monitor    Best  Practice (right click and "Reselect all SmartList Selections" daily)  Diet/type: NPO DVT prophylaxis: prophylactic heparin  GI prophylaxis: N/A Lines: N/A Foley:  N/A Code Status:  DNR.  Patient accepting of short term intubation if needed for procedure.  Last date of multidisciplinary goals of care discussion: family updated 10/20 at bedside on plan of care.  Labs   CBC: Recent Labs  Lab 07/30/21 1351 07/31/21 0453 07/31/21 1556 07/31/21 1842 07/31/21 1843 08/01/21 0339 08/02/21 0500 08/03/21 0500  WBC 20.4* 17.9*  --   --   --  15.3* 10.4 12.9*  NEUTROABS 12.8*  --   --   --   --   --   --   --   HGB 12.2 10.9*   < > 10.5* 11.2* 9.8* 9.6* 9.8*  HCT 35.7* 33.7*   < > 31.0* 33.0* 31.1* 30.8* 30.2*  MCV 93.0 96.6  --   --   --  100.3* 99.4 97.4  PLT 212 153  --   --   --  130* 92* 77*   < > = values in this interval not displayed.     Basic Metabolic Panel: Recent Labs  Lab 07/31/21 0453 07/31/21 1556 07/31/21 1843 08/01/21 0339 08/02/21 0500 08/02/21 2033 08/03/21 0500  NA 135   < > 138 139 142 142 143  K 4.2   < > 4.3 4.4 3.7 3.4* 3.8  CL 104  --   --  107 111 110 111  CO2 18*  --   --  19* 21* 21* 22  GLUCOSE 133*  --   --  105* 106* 131* 120*  BUN 60*  --   --  72* 68* 60* 56*  CREATININE 3.81*  --   --  3.40* 2.64* 2.34* 2.14*  CALCIUM 8.8*  --   --  8.3* 8.2* 8.5* 8.5*  MG  --   --   --   --  2.1 2.2 2.2   < > = values in this interval not displayed.    GFR: Estimated Creatinine Clearance: 20 mL/min (A) (by C-G formula based on SCr of 2.14 mg/dL (H)). Recent Labs  Lab 07/30/21 1351 07/30/21 1550 07/31/21 0453 07/31/21 1323 07/31/21 2113 08/01/21 0339 08/02/21 0500 08/03/21 0500  PROCALCITON  --   --   --  1.66  --  2.25 1.92  --   WBC 20.4*  --  17.9*  --   --  15.3* 10.4 12.9*  LATICACIDVEN 2.6* 2.0*  --   --  1.2  --   --   --      Liver Function Tests: Recent Labs  Lab 07/30/21 1351 07/31/21 0453 08/01/21 0339  AST 112* 79* 51*   ALT 31 30 26   ALKPHOS 58 51 48  BILITOT 1.0 1.0 0.8  PROT 6.9 6.3* 5.2*  ALBUMIN 4.2 3.6 2.7*    No results for input(s): LIPASE, AMYLASE in the last 168 hours. No results for input(s): AMMONIA in the last 168 hours.  ABG    Component Value Date/Time   PHART 7.347 (L) 07/31/2021 1556   PCO2ART 35.7 07/31/2021 1556   PO2ART 199 (H) 07/31/2021 1556   HCO3 21.5 07/31/2021 1843   TCO2 23 07/31/2021 1843   ACIDBASEDEF 6.0 (H) 07/31/2021 1843   O2SAT 76.9 08/03/2021 0547     This patient is critically ill with multiple organ system failure which requires frequent high complexity decision making, assessment, support, evaluation, and titration of therapies. This was completed through the application of advanced monitoring technologies and extensive interpretation of multiple databases. During this encounter critical care time was devoted to patient care services described in this note for 35 minutes.  Julian Hy, DO 08/03/21 7:55 AM [facility]

## 2021-08-03 NOTE — Plan of Care (Signed)
  Problem: Pain Managment: Goal: General experience of comfort will improve Outcome: Progressing   Problem: Safety: Goal: Ability to remain free from injury will improve Outcome: Progressing   Problem: Skin Integrity: Goal: Risk for impaired skin integrity will decrease Outcome: Progressing   

## 2021-08-03 NOTE — Progress Notes (Signed)
[facility] Progress Note Patient Name: [name] DOB: 30-Jun-1941 MRN: [id number]   Date of Service  08/03/2021  HPI/Events of Note  Remains in AFIB with RVR - Ventricular rate -  120's to 140's. BP = 169/79 by A-line.   eICU Interventions  Plan: Metoprolol 2.5 mg IV now.      Intervention Category Major Interventions: Arrhythmia - evaluation and management  Finleigh Cheong [place] 08/03/2021, 6:00 AM

## 2021-08-03 NOTE — Progress Notes (Signed)
Pt taken off BIPAP at this time and placed on 6L [place]. Pt tolerating well so far. RT will continue to monitor.

## 2021-08-03 NOTE — Progress Notes (Signed)
Structural Heart Disease Progress Note  Patient Name: [name] Date of Encounter: 08/03/2021  [facility] Cardiologist: None   Subjective   Extubated yesterday.  IABP 1:1/amio; CVP 6, PAP 31/21, CO 4.3, CI 2.4, Co-ox 77; No acute events overnight aside from AF RVR-->amiodarone-->metoprolol.  Afebrile.  WBC elevated to ~13.  Cr down to 2.1 and LFTs improving; Good UOP.  Inpatient Medications    Scheduled Meds:  albuterol  2.5 mg Nebulization TID   aspirin  81 mg Per Tube Daily   atorvastatin  80 mg Per Tube Daily   chlorhexidine gluconate (MEDLINE KIT)  15 mL Mouth Rinse BID   Chlorhexidine Gluconate Cloth  6 each Topical Daily   docusate  100 mg Per Tube BID   hydrALAZINE  25 mg Oral Q8H   insulin aspart  1-3 Units Subcutaneous Q4H   metoprolol tartrate  12.5 mg Oral BID   multivitamin with minerals  1 tablet Per Tube Daily   polyethylene glycol  17 g Per Tube Daily   potassium chloride  20 mEq Oral Once   predniSONE  2 mg Per Tube Daily   sodium chloride flush  10-40 mL Intracatheter Q12H   sodium chloride flush  3 mL Intravenous Q12H   vancomycin variable dose per unstable renal function (pharmacist dosing)   Does not apply See admin instructions   Continuous Infusions:  amiodarone 30 mg/hr (08/03/21 0800)   ceFEPime (MAXIPIME) IV Stopped (08/02/21 1624)   heparin 950 Units/hr (08/03/21 0800)   PRN Meds: acetaminophen **OR** acetaminophen, hydrALAZINE, polyethylene glycol, sodium chloride flush   Vital Signs    Vitals:   08/03/21 0600 08/03/21 0605 08/03/21 0700 08/03/21 0800  BP: (!) 128/104  122/69 140/78  Pulse: (!) 131 (!) 135 (!) 116 (!) 115  Resp: (!) _0 (!) 27  Temp: 99.1 F (37.3 C)  99.1 F (37.3 C) 99 F (37.2 C)  TempSrc:    Core  SpO2: 97% 98% 98% 96%  Weight: 70.2 kg     Height:        Intake/Output Summary (Last 24 hours) at 08/03/2021 0812 Last data filed at 08/03/2021 0800 Gross per 24 hour  Intake 1383.84 ml   Output 2890 ml  Net -1506.16 ml   Last 3 Weights 08/03/2021 08/02/2021 08/01/2021  Weight (lbs) 154 lb 12.2 oz 151 lb 0.2 oz 154 lb 12.2 oz  Weight (kg) 70.2 kg 68.5 kg 70.2 kg      Telemetry    Atrial fibrillation - Personally Reviewed  ECG    EKG 10/19 NSR anterior Qwaves - Personally Reviewed  Physical Exam   GEN: No acute distress.   Neck: No JVD; RIJ swan Cardiac: Irregular, no murmurs, rubs, or gallops.  Respiratory: Clear to auscultation bilaterally. GI: Soft, nontender, non-distended  MS: No edema; No deformity. Neuro:  Nonfocal  Psych: Normal affect   Labs    High Sensitivity Troponin:   Recent Labs  Lab 07/30/21 1351 07/30/21 1624 07/30/21 1945 07/30/21 2119  TROPONINIHS 16,652* 16,254* 13,992* 14,937*     Chemistry Recent Labs  Lab 07/30/21 1351 07/31/21 0453 07/31/21 1556 08/01/21 0339 08/02/21 0500 08/02/21 2033 08/03/21 0500  NA 133* 135   < > 139 142 142 143  K 4.4 4.2   < > 4.4 3.7 3.4* 3.8  CL 102 104  --  107 111 110 111  CO2 18* 18*  --  19* 21* 21* 22  GLUCOSE 136* 133*  --  105*  106* 131* 120*  BUN 62* 60*  --  72* 68* 60* 56*  CREATININE 4.43* 3.81*  --  3.40* 2.64* 2.34* 2.14*  CALCIUM 9.5 8.8*  --  8.3* 8.2* 8.5* 8.5*  MG  --   --   --   --  2.1 2.2 2.2  PROT 6.9 6.3*  --  5.2*  --   --   --   ALBUMIN 4.2 3.6  --  2.7*  --   --   --   AST 112* 79*  --  51*  --   --   --   ALT 31 30  --  26  --   --   --   ALKPHOS 58 51  --  48  --   --   --   BILITOT 1.0 1.0  --  0.8  --   --   --   GFRNONAA 9* 11*  --  13* 18* 20* 23*  ANIONGAP 13 13  --  _0 < > = values in this interval not displayed.    Lipids  Recent Labs  Lab 08/01/21 0339  TRIG 145    Hematology Recent Labs  Lab 08/01/21 0339 08/02/21 0500 08/03/21 0500  WBC 15.3* 10.4 12.9*  RBC 3.10* 3.10* 3.10*  HGB 9.8* 9.6* 9.8*  HCT 31.1* 30.8* 30.2*  MCV 100.3* 99.4 97.4  MCH 31.6 31.0 31.6  MCHC 31.5 31.2 32.5  RDW 13.3 13.5 13.6  PLT 130* 92* 77*    Thyroid  Recent Labs  Lab 07/30/21 1450  TSH 1.050  FREET4 0.90    BNP Recent Labs  Lab 07/30/21 1945  BNP 2,001.3*    DDimer No results for input(s): DDIMER in the last 168 hours.   Radiology    DG CHEST PORT 1 VIEW  Result Date: 08/02/2021 CLINICAL DATA:  Weakness and shortness of breath. EXAM: PORTABLE CHEST 1 VIEW COMPARISON:  08/01/2021 FINDINGS: Endotracheal tube is in place, tip 4 centimeters above the carina. Nasogastric tube tip is off the image. RIGHT-sided Swan-Ganz catheter tip overlies the level of the RIGHT LOWER lobe pulmonary artery. Ventriculoperitoneal shunt courses across the RIGHT side of the chest. RIGHT lung is clear with minimal basilar atelectasis. There is persistent opacity at the LEFT lung base which partially obscures the hemidiaphragm and is associated with small effusion, similar to prior. IMPRESSION: Similar appearance of LEFT LOWER lobe opacity and small effusion. Electronically Signed   By: Nolon Nations M.D.   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  CS due to severe MR.  Seen by CTS and thought to be best served by transcatheter edge to edge mitral repair.  NPO at [facility] tonight for procedure tomorrow.  Patient and family consented for procedure.  Signed consent in chart. 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  Has mildly elevated WBC and no worsening infiltrates on CXR.  Will proceed to Mitraclip tomorrow AM (regardless of WBC) unless floridly septic as our window for intervention is limited. Atrial fibrillation:   Continue amiodarone for now.  Moderate tachycardia/AF RVR beneficial in this situation given low forward stroke volume and AF RVR will not be able to be adequately controlled until severe MR is addressed. AKI:  Improved with IABP, afterload reduction.  Cr 2/22 0.87.   Pneumonia:  Remains on broad spectrum abx; all blood cultures negative.        For questions or updates, please contact [place] Please consult [email or web address] for contact info under        Signed, Early Osmond, MD  08/03/2021, 8:12 AM

## 2021-08-03 NOTE — Progress Notes (Signed)
ANTICOAGULATION CONSULT NOTE  Pharmacy Consult for heparin Indication:  IABP  Allergies  Allergen Reactions   Methotrexate Derivatives Other (See Comments)    Dangerously decreased platelet count   Penicillins Itching    Tolerated Cephalosporin Date: 04/30/20.     Sulfa Antibiotics Itching    Patient Measurements: Height: 5\' 7"  (170.2 cm) Weight: 70.2 kg (154 lb 12.2 oz) IBW/kg (Calculated) : 61.6 Heparin Dosing Weight: 77kg  Vital Signs: Temp: 99.1 F (37.3 C) (10/23 0700) BP: 122/69 (10/23 0700) Pulse Rate: 116 (10/23 0700)  Labs: Recent Labs    08/01/21 0339 08/02/21 0500 08/02/21 1700 08/02/21 2033 08/03/21 0500  HGB 9.8* 9.6*  --   --  9.8*  HCT 31.1* 30.8*  --   --  30.2*  PLT 130* 92*  --   --  77*  HEPARINUNFRC 0.23* 0.14* 0.24*  --  0.34  CREATININE 3.40* 2.64*  --  2.34* 2.14*     Estimated Creatinine Clearance: 20 mL/min (A) (by C-G formula based on SCr of 2.14 mg/dL (H)).   Assessment: 80 yo female admitted with cardiogenic shock s/p IABP for heparin. Will remain on IABP until plan for MitraClip on Monday. No AC PTA.   Heparin level this AM came back therapeutic at 0.34 on 950 units/hr. Hemoglobin stable at 9.8 and platelets decreased further to 77- will monitor closely. No s/sx of bleeding or infusion issues.  Goal of Therapy:  Heparin level 0.2-0.5 units/ml Monitor platelets by anticoagulation protocol: Yes   Plan:  Continue heparin infusion at 950 units/hr Daily CBC and HL while on heparin  Antonietta Jewel, PharmD, [facility] Pharmacist  Phone: [phone] 08/03/2021 7:21 AM  Please check AMION for all [place] phone numbers After 10:00 PM, call [place] [phone]

## 2021-08-04 ENCOUNTER — Inpatient Hospital Stay (HOSPITAL_COMMUNITY): Payer: Medicare Other

## 2021-08-04 ENCOUNTER — Encounter (HOSPITAL_COMMUNITY)
Admission: EM | Disposition: A | Discharge status: Skilled Nursing Facility | Payer: Medicare Other | Source: Home / Self Care | Attending: Critical Care Medicine

## 2021-08-04 ENCOUNTER — Inpatient Hospital Stay (HOSPITAL_COMMUNITY): Payer: Medicare Other | Admitting: Certified Registered Nurse Anesthetist

## 2021-08-04 DIAGNOSIS — Z006 Encounter for examination for normal comparison and control in clinical research program: Secondary | ICD-10-CM

## 2021-08-04 DIAGNOSIS — I5031 Acute diastolic (congestive) heart failure: Secondary | ICD-10-CM | POA: Diagnosis not present

## 2021-08-04 DIAGNOSIS — N179 Acute kidney failure, unspecified: Secondary | ICD-10-CM | POA: Diagnosis not present

## 2021-08-04 DIAGNOSIS — I340 Nonrheumatic mitral (valve) insufficiency: Secondary | ICD-10-CM

## 2021-08-04 HISTORY — PX: TEE WITHOUT CARDIOVERSION: SHX5443

## 2021-08-04 HISTORY — PX: MITRAL VALVE REPAIR: CATH118311

## 2021-08-04 LAB — HEPATIC FUNCTION PANEL
ALT: 27 U/L (ref 0–44)
AST: 39 U/L (ref 15–41)
Albumin: 2.4 g/dL — ABNORMAL LOW (ref 3.5–5.0)
Alkaline Phosphatase: 64 U/L (ref 38–126)
Bilirubin, Direct: <0.1 mg/dL (ref 0.0–0.2)
Total Bilirubin: 1 mg/dL (ref 0.3–1.2)
Total Protein: 5.5 g/dL — ABNORMAL LOW (ref 6.5–8.1)

## 2021-08-04 LAB — BASIC METABOLIC PANEL
Anion gap: 12 (ref 5–15)
BUN: 49 mg/dL — ABNORMAL HIGH (ref 8–23)
CO2: 22 mmol/L (ref 22–32)
Calcium: 8.2 mg/dL — ABNORMAL LOW (ref 8.9–10.3)
Chloride: 105 mmol/L (ref 98–111)
Creatinine, Ser: 1.98 mg/dL — ABNORMAL HIGH (ref 0.44–1.00)
GFR, Estimated: 25 mL/min — ABNORMAL LOW (ref >60)
Glucose, Bld: 141 mg/dL — ABNORMAL HIGH (ref 70–99)
Potassium: 2.9 mmol/L — ABNORMAL LOW (ref 3.5–5.1)
Sodium: 139 mmol/L (ref 135–145)

## 2021-08-04 LAB — ECHO INTRAOPERATIVE TEE
Height: 67 in
MV M vel: 5.83 m/s
MV Peak grad: 136 mmHg
Radius: 0.95 cm
Weight: 2405.66 oz

## 2021-08-04 LAB — COOXEMETRY PANEL
Carboxyhemoglobin: 1.1 % (ref 0.5–1.5)
Methemoglobin: 0.9 % (ref 0.0–1.5)
O2 Saturation: 72.9 %
Total hemoglobin: 10.2 g/dL — ABNORMAL LOW (ref 12.0–16.0)

## 2021-08-04 LAB — GLUCOSE, CAPILLARY
Glucose-Capillary: 133 mg/dL — ABNORMAL HIGH (ref 70–99)
Glucose-Capillary: 105 mg/dL — ABNORMAL HIGH (ref 70–99)
Glucose-Capillary: 122 mg/dL — ABNORMAL HIGH (ref 70–99)
Glucose-Capillary: 131 mg/dL — ABNORMAL HIGH (ref 70–99)
Glucose-Capillary: 19 mg/dL — CL (ref 70–99)
Glucose-Capillary: 123 mg/dL — ABNORMAL HIGH (ref 70–99)
Glucose-Capillary: 134 mg/dL — ABNORMAL HIGH (ref 70–99)
Glucose-Capillary: 125 mg/dL — ABNORMAL HIGH (ref 70–99)
Glucose-Capillary: 136 mg/dL — ABNORMAL HIGH (ref 70–99)

## 2021-08-04 LAB — CBC
HCT: 30.6 % — ABNORMAL LOW (ref 36.0–46.0)
Hemoglobin: 10 g/dL — ABNORMAL LOW (ref 12.0–15.0)
MCH: 31.4 pg (ref 26.0–34.0)
MCHC: 32.7 g/dL (ref 30.0–36.0)
MCV: 96.2 fL (ref 80.0–100.0)
Platelets: 66 10*3/uL — ABNORMAL LOW (ref 150–400)
RBC: 3.18 MIL/uL — ABNORMAL LOW (ref 3.87–5.11)
RDW: 13.6 % (ref 11.5–15.5)
WBC: 12.8 10*3/uL — ABNORMAL HIGH (ref 4.0–10.5)
nRBC: 0 % (ref 0.0–0.2)

## 2021-08-04 LAB — POCT ACTIVATED CLOTTING TIME
Activated Clotting Time: 126 seconds
Activated Clotting Time: 248 seconds
Activated Clotting Time: 300 seconds
Activated Clotting Time: 312 seconds
Activated Clotting Time: 283 seconds

## 2021-08-04 LAB — CULTURE, BLOOD (ROUTINE X 2)
Culture: NO GROWTH
Culture: NO GROWTH
Special Requests: ADEQUATE
Special Requests: ADEQUATE

## 2021-08-04 LAB — VANCOMYCIN, RANDOM: Vancomycin Rm: 15

## 2021-08-04 LAB — LEGIONELLA PNEUMOPHILA SEROGP 1 UR AG: L. pneumophila Serogp 1 Ur Ag: NEGATIVE

## 2021-08-04 LAB — HEPARIN LEVEL (UNFRACTIONATED): Heparin Unfractionated: 0.2 IU/mL — ABNORMAL LOW (ref 0.30–0.70)

## 2021-08-04 LAB — MAGNESIUM: Magnesium: 1.9 mg/dL (ref 1.7–2.4)

## 2021-08-04 LAB — PHOSPHORUS: Phosphorus: 2.8 mg/dL (ref 2.5–4.6)

## 2021-08-04 SURGERY — MITRAL VALVE REPAIR
Anesthesia: General

## 2021-08-04 MED ORDER — SODIUM CHLORIDE 0.9 % IV SOLN: 250.0000 mL | INTRAVENOUS | Status: DC | PRN

## 2021-08-04 MED ORDER — HYDRALAZINE HCL 20 MG/ML IJ SOLN
5.0000 mg | INTRAMUSCULAR | Status: DC | PRN
  Filled 2021-08-04: qty 1

## 2021-08-04 MED ORDER — HEPARIN (PORCINE) IN NACL 1000-0.9 UT/500ML-% IV SOLN
INTRAVENOUS | Status: AC
  Filled 2021-08-04: qty 1000

## 2021-08-04 MED ORDER — POTASSIUM CHLORIDE CRYS ER 20 MEQ PO TBCR
20.0000 meq | EXTENDED_RELEASE_TABLET | Freq: Once | ORAL | Status: AC
  Administered 2021-08-04: 20 meq via ORAL
  Filled 2021-08-04: qty 1

## 2021-08-04 MED ORDER — LACTATED RINGERS IV SOLN: INTRAVENOUS | Status: DC | PRN

## 2021-08-04 MED ORDER — SUGAMMADEX SODIUM 200 MG/2ML IV SOLN
INTRAVENOUS | Status: DC | PRN
  Administered 2021-08-04: 200 mg via INTRAVENOUS

## 2021-08-04 MED ORDER — TRAMADOL HCL 50 MG PO TABS
50.0000 mg | ORAL_TABLET | Freq: Two times a day (BID) | ORAL | Status: DC | PRN
  Administered 2021-08-05: 50 mg via ORAL
  Filled 2021-08-04: qty 1

## 2021-08-04 MED ORDER — ROCURONIUM BROMIDE 10 MG/ML (PF) SYRINGE
PREFILLED_SYRINGE | INTRAVENOUS | Status: DC | PRN
  Administered 2021-08-04 (×2): 50 mg via INTRAVENOUS

## 2021-08-04 MED ORDER — SODIUM CHLORIDE 0.9% FLUSH: 3.0000 mL | INTRAVENOUS | Status: DC | PRN

## 2021-08-04 MED ORDER — NITROGLYCERIN IN D5W 200-5 MCG/ML-% IV SOLN
INTRAVENOUS | Status: DC | PRN
Start: 2021-08-04 — End: 2021-08-04
  Administered 2021-08-04: 10 ug/min via INTRAVENOUS

## 2021-08-04 MED ORDER — LABETALOL HCL 5 MG/ML IV SOLN
10.0000 mg | INTRAVENOUS | Status: DC | PRN
  Administered 2021-08-06: 10 mg via INTRAVENOUS
  Filled 2021-08-04 (×2): qty 4

## 2021-08-04 MED ORDER — HEPARIN (PORCINE) IN NACL 1000-0.9 UT/500ML-% IV SOLN
INTRAVENOUS | Status: DC | PRN
  Administered 2021-08-04 (×3): 500 mL

## 2021-08-04 MED ORDER — CHLORHEXIDINE GLUCONATE 0.12 % MT SOLN: 15.0000 mL | Freq: Once | OROMUCOSAL | Status: DC

## 2021-08-04 MED ORDER — ONDANSETRON HCL 4 MG/2ML IJ SOLN
INTRAMUSCULAR | Status: DC | PRN
  Administered 2021-08-04: 4 mg via INTRAVENOUS

## 2021-08-04 MED ORDER — ACETAMINOPHEN 325 MG PO TABS: 650.0000 mg | ORAL_TABLET | ORAL | Status: DC | PRN

## 2021-08-04 MED ORDER — FENTANYL CITRATE (PF) 250 MCG/5ML IJ SOLN
INTRAMUSCULAR | Status: DC | PRN
  Administered 2021-08-04: 50 ug via INTRAVENOUS

## 2021-08-04 MED ORDER — HEPARIN SODIUM (PORCINE) 1000 UNIT/ML IJ SOLN
INTRAMUSCULAR | Status: DC | PRN
  Administered 2021-08-04: 5000 [IU] via INTRAVENOUS
  Administered 2021-08-04: 4000 [IU] via INTRAVENOUS
  Administered 2021-08-04: 2000 [IU] via INTRAVENOUS

## 2021-08-04 MED ORDER — HEPARIN (PORCINE) IN NACL 2000-0.9 UNIT/L-% IV SOLN
INTRAVENOUS | Status: AC
  Filled 2021-08-04: qty 2000

## 2021-08-04 MED ORDER — PROTAMINE SULFATE 10 MG/ML IV SOLN
INTRAVENOUS | Status: DC | PRN
  Administered 2021-08-04 (×2): 10 mg via INTRAVENOUS
  Administered 2021-08-04 (×2): 5 mg via INTRAVENOUS

## 2021-08-04 MED ORDER — HEPARIN SODIUM (PORCINE) 1000 UNIT/ML IJ SOLN
INTRAMUSCULAR | Status: AC
  Filled 2021-08-04: qty 1

## 2021-08-04 MED ORDER — FUROSEMIDE 10 MG/ML IJ SOLN
INTRAMUSCULAR | Status: DC | PRN
  Administered 2021-08-04 (×4): 10 mg via INTRAMUSCULAR

## 2021-08-04 MED ORDER — ONDANSETRON HCL 4 MG/2ML IJ SOLN: 4.0000 mg | Freq: Four times a day (QID) | INTRAMUSCULAR | Status: DC | PRN

## 2021-08-04 MED ORDER — HEPARIN (PORCINE) 25000 UT/250ML-% IV SOLN
950.0000 [IU]/h | INTRAVENOUS | Status: DC
  Administered 2021-08-04: 950 [IU]/h via INTRAVENOUS

## 2021-08-04 MED ORDER — POTASSIUM CHLORIDE 10 MEQ/50ML IV SOLN
10.0000 meq | INTRAVENOUS | Status: AC
  Administered 2021-08-04 (×6): 10 meq via INTRAVENOUS
  Filled 2021-08-04 (×6): qty 50

## 2021-08-04 MED ORDER — CHLORHEXIDINE GLUCONATE 4 % EX LIQD
1.0000 | Freq: Once | CUTANEOUS | Status: AC
Start: 2021-08-04 — End: 2021-08-04
  Administered 2021-08-04: 1 via TOPICAL
  Filled 2021-08-04: qty 60

## 2021-08-04 MED ORDER — MORPHINE SULFATE (PF) 2 MG/ML IV SOLN: 0.5000 mg | INTRAVENOUS | Status: DC | PRN

## 2021-08-04 MED ORDER — MAGNESIUM SULFATE IN D5W 1-5 GM/100ML-% IV SOLN
1.0000 g | Freq: Once | INTRAVENOUS | Status: AC
  Administered 2021-08-04: 1 g via INTRAVENOUS
  Filled 2021-08-04: qty 100

## 2021-08-04 MED ORDER — LIDOCAINE 2% (20 MG/ML) 5 ML SYRINGE
INTRAMUSCULAR | Status: DC | PRN
  Administered 2021-08-04: 20 mg via INTRAVENOUS

## 2021-08-04 MED ORDER — VANCOMYCIN HCL 750 MG/150ML IV SOLN
750.0000 mg | Freq: Once | INTRAVENOUS | Status: AC
  Administered 2021-08-04: 750 mg via INTRAVENOUS
  Filled 2021-08-04: qty 150

## 2021-08-04 MED ORDER — SODIUM CHLORIDE 0.9% FLUSH
3.0000 mL | Freq: Two times a day (BID) | INTRAVENOUS | Status: DC
  Administered 2021-08-04 – 2021-08-05 (×2): 3 mL via INTRAVENOUS

## 2021-08-04 MED ORDER — PROPOFOL 10 MG/ML IV BOLUS
INTRAVENOUS | Status: DC | PRN
  Administered 2021-08-04: 60 mg via INTRAVENOUS
  Administered 2021-08-04 (×3): 10 mg via INTRAVENOUS

## 2021-08-04 MED ORDER — AMIODARONE HCL IN DEXTROSE 360-4.14 MG/200ML-% IV SOLN
INTRAVENOUS | Status: AC
  Filled 2021-08-04: qty 200

## 2021-08-04 MED ORDER — HEPARIN (PORCINE) IN NACL 2000-0.9 UNIT/L-% IV SOLN
INTRAVENOUS | Status: DC | PRN
  Administered 2021-08-04 (×3): 1000 mL

## 2021-08-04 MED ORDER — NITROGLYCERIN IN D5W 200-5 MCG/ML-% IV SOLN: 0.0000 ug/min | INTRAVENOUS | Status: DC

## 2021-08-04 MED ORDER — PHENYLEPHRINE HCL-NACL 20-0.9 MG/250ML-% IV SOLN
INTRAVENOUS | Status: DC | PRN
  Administered 2021-08-04: 20 ug/min via INTRAVENOUS

## 2021-08-04 MED ORDER — NITROGLYCERIN 0.2 MG/ML ON CALL CATH LAB
INTRAVENOUS | Status: DC | PRN
  Administered 2021-08-04: 80 ug via INTRAVENOUS

## 2021-08-04 MED ORDER — DEXAMETHASONE SODIUM PHOSPHATE 10 MG/ML IJ SOLN
INTRAMUSCULAR | Status: DC | PRN
  Administered 2021-08-04: 5 mg via INTRAVENOUS

## 2021-08-04 SURGICAL SUPPLY — 16 items
CATH MITRA STEERABLE GUIDE (CATHETERS) ×2 IMPLANT
CLIP MITRA G4 DELIVERY SYS NTW (Clip) ×2 IMPLANT
CLOSURE PERCLOSE PROSTYLE (VASCULAR PRODUCTS) ×4 IMPLANT
KIT DILATOR VASC 18G NDL (KITS) ×2 IMPLANT
KIT HEART LEFT (KITS) ×4 IMPLANT
KIT VERSACROSS STEERABLE D1 (CATHETERS) ×2 IMPLANT
NEEDLE PERC 21GX4CM (NEEDLE) ×2 IMPLANT
PACK CARDIAC CATHETERIZATION (CUSTOM PROCEDURE TRAY) ×2 IMPLANT
SHEATH PROBE COVER 6X72 (BAG) ×6 IMPLANT
STOPCOCK MORSE 400PSI 3WAY (MISCELLANEOUS) ×12 IMPLANT
SYSTEM MITRACLIP G4 (SYSTAGENIX WOUND MANAGEMENT) ×2 IMPLANT
TRANSDUCER W/STOPCOCK (MISCELLANEOUS) ×2 IMPLANT
TUBING ART PRESS 72  MALE/FEM (TUBING) ×1
TUBING ART PRESS 72 MALE/FEM (TUBING) ×1 IMPLANT
TUBING CIL FLEX 10 FLL-RA (TUBING) ×2 IMPLANT
WIRE MICRO SET SILHO 5FR 7 (SHEATH) ×4 IMPLANT

## 2021-08-04 NOTE — Progress Notes (Signed)
Echocardiogram Echocardiogram Transesophageal has been performed.   Oneal Deputy Shareen Capwell RDCS 08/04/2021, 4:21 PM

## 2021-08-04 NOTE — Progress Notes (Signed)
Pharmacy Antibiotic Note  [name] is a 80 y.o. female admitted on 07/30/2021 with shock and renal failure. Pharmacy has been consulted for vancomycin and cefepime dosing.   Scr now down to 2.64 (CrCl 16 mL/min). PCT 2.25 to 1.92 on 10/22. Afebrile. Vancomycin random this morning came back at 15. Patient received 750 mg with mitraclip procedure today. Will order level tomorrow and continue to dose per levels until renal function is more stable.  Plan: Cefepime 2g IV q24h Vancomycin 750 mg IV once today periop Watch renal function, will order VR 10/25   Height: 5\' 7"  (170.2 cm) Weight: 68.2 kg (150 lb 5.7 oz) IBW/kg (Calculated) : 61.6  Temp (24hrs), Avg:99 F (37.2 C), Min:98.6 F (37 C), Max:99.9 F (37.7 C)  Recent Labs  Lab 07/30/21 1351 07/30/21 1550 07/31/21 0453 07/31/21 2113 08/01/21 0339 08/02/21 0500 08/02/21 2033 08/03/21 0500 08/04/21 0411  WBC 20.4*  --  17.9*  --  15.3* 10.4  --  12.9* 12.8*  CREATININE 4.43*  --  3.81*  --  3.40* 2.64* 2.34* 2.14* 1.98*  LATICACIDVEN 2.6* 2.0*  --  1.2  --   --   --   --   --   VANCORANDOM  --   --   --   --   --  14  --   --  15     Estimated Creatinine Clearance: 21.7 mL/min (A) (by C-G formula based on SCr of 1.98 mg/dL (H)).    Allergies  Allergen Reactions   Methotrexate Derivatives Other (See Comments)    Dangerously decreased platelet count   Penicillins Itching    Tolerated Cephalosporin Date: 04/30/20.     Sulfa Antibiotics Itching    Antimicrobials this admission: Ceftriaxone 10/19 x1 Azithromycin 10/19 x1 Vancomycin 10/20 >> Cefepime 10/20 >>   Microbiology results: 10/20 Bcx: NGTD 10/19 BCx: NGTD 10/19 UCx: NGTD 10/19 MRSA PCR: negative  Thank you for allowing pharmacy to participate in this patient's care.  Reatha Harps, PharmD PGY1 Pharmacy Resident 08/04/2021 2:59 PM Check [email or web address] for unit specific pharmacy number

## 2021-08-04 NOTE — Progress Notes (Signed)
NAME:  [name], MRN:  [id number], DOB:  07-20-1941, LOS: 5 ADMISSION DATE:  07/30/2021, CONSULTATION DATE:  07/31/21 REFERRING MD:  Dr. Beryl Meager, CHIEF COMPLAINT:  Weakness   History of Present Illness:  80 y/o F who presented to [facility] on 10/19 with reports of weakness.   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  She reported on presentation that she had been feeling increasingly weak over the preceding four days.  She went to get a flu shot two days ago and seemed somewhat altered per family.  The weakness progressed to the point she could not sit or stand on her own which is a marked change from her baseline.  In the ER she reported shortness of breath & vague abdominal pain prior to presentation.  Initial ER evaluation notable for SBP 80-100's and new O2 need of 2-6L.  She was transitioned to BiPAP support after IVF.  Labs showed Na 133, Bicarb 18, BUN 62, Cr 4.43 (baseline 0.7), AST 112, WBC 20.4, troponin of 16, 652, lactic acid 2.62.  She was negative for COVID and influenza.  CXR showed small left pleural effusion, mild atelectasis on right & bibasilar atelectasis.  EKG showed ST changes thought to be non-specific.  She was admitted per [facility] for further work up with working diagnosis of possible PNA / infectious etiology of weakness.  She had improvement in BP and lactate with IVF + abx.  Subsequent ECHO showed an LVEF of 60-65%, LV with regional wall motion abnormalities with mid inferolateral hypokinesis, grade II diastolic dysfunction, RVSP of 63.7, LA severely dilated, RA mildly dilated, question of flail mitral valve.  ECHO findings concerning for MI with papillary muscle rupture and severe MR.  She remained on BiPAP for shortness of breath.     PCCM consulted for pulmonary evaluation.   Pertinent  Medical History  Degenerative Disease  Normal Pressure Hydrocephalus s/p Stent Neuropathy  Vertigo  HLD Depression  Former Smoker - smoked total 20 years RA  - on leflunomide, prednisone  Retired Therapist, sports   Significant Hospital Events: Including procedures, antibiotic start and stop dates in addition to other pertinent events   10/19 Admit with weakness, SOB, elevated troponin  10/20 PCCM consulted, intubated, TEE, IABP, Swan 10/21 failed SBT due to lack of respiratory drive 08/14 extubated, Afib with RVR- on amio.  Interim History / Subjective:  No acute events.  Anxious about procedure this AM  Objective   Blood pressure (!) 145/104, pulse 87, temperature 98.6 F (37 C), resp. rate 18, height 5\' 7"  (1.702 m), weight 68.2 kg, SpO2 96 %. PAP: (29-45)/(11-28) 38/18 CVP:  [4 mmHg-16 mmHg] 9 mmHg CO:  [4 L/min-7.2 L/min] 7 L/min CI:  [2.3 L/min/m2-4 L/min/m2] 3.9 L/min/m2      Intake/Output Summary (Last 24 hours) at 08/04/2021 0744 Last data filed at 08/04/2021 0700 Gross per 24 hour  Intake 1620.4 ml  Output 3057 ml  Net -1436.6 ml    Filed Weights   08/02/21 0600 08/03/21 0600 08/04/21 0600  Weight: 68.5 kg 70.2 kg 68.2 kg    Examination: General: ill appearing woman lying in bed, comfortable, in NAD HENT: [place]/AT, eyes anicteric, EOMI Lungs: breathing comfortably on [place], CTAB Cardiovascular:  RRR, 2/6 SEM Abdomen: soft NT Extremities: RLE IABP with normal distal perfusion, no cyanosis or edema Neuro: awake and alert, moving extremities and following commands. Oriented to person and place.   Assessment & Plan:   Suspected silent recent MI  with papillary muscle rupture Severe MR Acute HFpEF Probable NSTEMI Wall motion abnormalities on ECHO, progression to severe MR findings worrisome for papillary muscle rupture (though no definite evidence on echo).  Baseline MV disease but progression noted on ECHO.  -IABP per cardiology, con't 1:1. Con't heparin. -did not tolerate milrinone due to tachycardia -Mitral clip planned for today -blood cx NGTD; con't broad antibiotics given immunosuppression history and elevated PCT  New- onset  Afib with RVR -Continue amio, heparin, Metoprolol  Hypertension -hydralazine -additional hydralazine PRN  Acute hypoxic respiratory failure  Acute pulmonary edema Left pleural effusion with atelectasis  -supplemental O2 to maintain SpO >90% -BiPAP PRN -getting reintubated for procedure Monday  AKI, improving Baseline renal function normal.  Suspect pre-renal from heart failure.   -strict I/O -Follow BMP  Hypokalemia, Hypomagnesemia - s/p repletion. Follow BMP  Elevated LFT's 2/2 congestive hepatopathy.  -monitor  Leukocytosis  Immune suppressed  Doubt infectious etiology, more likely stress response from MI.  Note immune suppression with RA therapy-leflunomide, low-dose prednisone, Enbrel.  -follow blood cultures until finalized -con't antibiotics  Rheumatoid arthritis  -continue low-dose prednisone; may need stress dose steroids if she demonstrates signs of adrenal insufficiency -con't to hold leflunomide and Enbrel  Normal pressure hydrocephalus with hx shunt. CT head with shunt in place, slight enlargement of ventricles.  CT Abd/Pelvis with small amt free fluid in abdomen.  -No intervention needed  Hyperglycemia, A1c 5.2. No insulin needs overnight.  -Continue to monitor - Goal BG less than 180 -SSI PRN  Thrombocytopenia due to IABP -monitor  Best Practice (right click and "Reselect all SmartList Selections" daily)  Diet/type: NPO DVT prophylaxis: prophylactic heparin  GI prophylaxis: N/A Lines: N/A Foley:  N/A Code Status:  DNR.  Patient accepting of short term intubation if needed for procedure.  Last date of multidisciplinary goals of care discussion: family updated 10/20 at bedside on plan of care.   CC time: 35 min.   Montey Hora, [place] [facility] For pager details, please see AMION or use Epic chat  After 1900, please call [facility] for cross coverage needs 08/04/2021, 7:58 AM

## 2021-08-04 NOTE — Anesthesia Preprocedure Evaluation (Addendum)
Anesthesia Evaluation  Patient identified by MRN, date of birth, ID band Patient awake    Reviewed: Allergy & Precautions, NPO status , Patient's Chart, lab work & pertinent test results, reviewed documented beta blocker date and time   History of Anesthesia Complications Negative for: history of anesthetic complications  Airway Mallampati: III  TM Distance: >3 FB Neck ROM: Full  Mouth opening: Limited Mouth Opening  Dental  (+) Dental Advisory Given, Teeth Intact   Pulmonary former smoker,  Recently extubated (was intubated b/c cardiogenic shock)   breath sounds clear to auscultation       Cardiovascular hypertension, Pt. on medications and Pt. on home beta blockers pulmonary hypertension+CHF  + dysrhythmias Atrial Fibrillation + Valvular Problems/Murmurs MR  Rhythm:Regular Rate:Normal + Systolic murmurs 48/54/6270 ECHO: EF 60-65%, normal LVF, inferolat hypokinesis, Grade 2 DD, severe MR with possible ruptured pap muscle, mild TR   Neuro/Psych  Headaches, Depression Normal pressure hydrocephalus vertigo    GI/Hepatic negative GI ROS,   Endo/Other  negative endocrine ROS  Renal/GU Renal InsufficiencyRenal disease (K+ 2.9 (receiving potassium))     Musculoskeletal  (+) Arthritis , Osteoarthritis, Rheumatoid disorders and steroids,    Abdominal   Peds  Hematology  (+) Blood dyscrasia (Hb 10.0, plt 66K), anemia ,   Anesthesia Other Findings   Reproductive/Obstetrics                            Anesthesia Physical Anesthesia Plan  ASA: 4  Anesthesia Plan: General   Post-op Pain Management:    Induction: Intravenous  PONV Risk Score and Plan: 3 and Ondansetron, Dexamethasone and Treatment may vary due to age or medical condition  Airway Management Planned: Oral ETT and Video Laryngoscope Planned  Additional Equipment: Arterial line  Intra-op Plan:   Post-operative Plan: Possible  Post-op intubation/ventilation  Informed Consent: I have reviewed the patients History and Physical, chart, labs and discussed the procedure including the risks, benefits and alternatives for the proposed anesthesia with the patient or authorized representative who has indicated his/her understanding and acceptance.   Patient has DNR.  Discussed DNR with patient and Suspend DNR.   Dental advisory given  Plan Discussed with: CRNA and Surgeon  Anesthesia Plan Comments:        Anesthesia Quick Evaluation

## 2021-08-04 NOTE — Progress Notes (Signed)
ANTICOAGULATION CONSULT NOTE  Pharmacy Consult for heparin Indication:  IABP  Allergies  Allergen Reactions   Methotrexate Derivatives Other (See Comments)    Dangerously decreased platelet count   Penicillins Itching    Tolerated Cephalosporin Date: 04/30/20.     Sulfa Antibiotics Itching    Patient Measurements: Height: 5\' 7"  (170.2 cm) Weight: 68.2 kg (150 lb 5.7 oz) IBW/kg (Calculated) : 61.6 Heparin Dosing Weight: 77kg  Vital Signs: Temp: 99 F (37.2 C) (10/24 1200) Temp Source: Core (10/24 1200) BP: 161/108 (10/24 1200) Pulse Rate: 79 (10/24 1200)  Labs: Recent Labs    08/02/21 0500 08/02/21 0944 08/02/21 1700 08/02/21 2033 08/03/21 0500 08/03/21 1739 08/04/21 0411  HGB 9.6* 10.2*  --   --  9.8*  --  10.0*  HCT 30.8* 30.0*  --   --  30.2*  --  30.6*  PLT 92*  --   --   --  77*  --  66*  LABPROT  --   --   --   --   --  15.4*  --   INR  --   --   --   --   --  1.2  --   HEPARINUNFRC 0.14*  --  0.24*  --  0.34  --  0.20*  CREATININE 2.64*  --   --  2.34* 2.14*  --  1.98*     Estimated Creatinine Clearance: 21.7 mL/min (A) (by C-G formula based on SCr of 1.98 mg/dL (H)).   Assessment: 80 yo female admitted with cardiogenic shock s/p IABP for heparin. Will remain on IABP until plan for MitraClip on Monday. No AC PTA.   Heparin level this AM came back at lower end of therapeutic at 0.2 on 950 units/hr. Hemoglobin stable at 10.0 and platelets decreased further to 66- will monitor closely. No s/sx of bleeding or infusion issues. Patient was taken to cath lab to perform mitraclip procedure. Will follow up post-op plans for anticoagulation.    Goal of Therapy:  Heparin level 0.2-0.5 units/ml Monitor platelets by anticoagulation protocol: Yes   Plan:  Continue heparin infusion at 950 units/hr Daily CBC and HL while on heparin  Thank you for allowing pharmacy to participate in this patient's care.  Reatha Harps, PharmD PGY1 Pharmacy Resident 08/04/2021  3:03 PM Check [email or web address] for unit specific pharmacy number

## 2021-08-04 NOTE — Anesthesia Postprocedure Evaluation (Signed)
Anesthesia Post Note  Patient: [name]  Procedure(s) Performed: MITRAL VALVE REPAIR TRANSESOPHAGEAL ECHOCARDIOGRAM (TEE)     Patient location during evaluation: Cath Lab Anesthesia Type: General Level of consciousness: awake and alert, patient cooperative and oriented Pain management: pain level controlled Vital Signs Assessment: post-procedure vital signs reviewed and stable Respiratory status: spontaneous breathing, nonlabored ventilation, respiratory function stable and patient connected to nasal cannula oxygen Cardiovascular status: blood pressure returned to baseline and stable Postop Assessment: no apparent nausea or vomiting Anesthetic complications: no   Encounter Notable Events  Notable Event Outcome Phase Comment  None  Intraprocedure     Last Vitals:  Vitals:   08/04/21 1705 08/04/21 1710  BP: (!) 147/57 (!) 145/58  Pulse:  67  Resp:  17  Temp:    SpO2:  90%    Last Pain:  Vitals:   08/04/21 1655  TempSrc: Temporal  PainSc:                  Ashlin Kreps,E. Jacarra Bobak

## 2021-08-04 NOTE — Progress Notes (Signed)
[facility] Progress Note Patient Name: Mercadies Co DOB: [birth year] MRN: [id number]   Date of Service  08/04/2021  HPI/Events of Note  Hypokalemia  Hypomagnesemia - K+ = 2.9, Mg++ = 1.9 and Creatinine = 1.98.  eICU Interventions  Plan: Will replace K+ and Mg++. Initiate eLink Electrolyte replacement Protocol     Intervention Category Major Interventions: Electrolyte abnormality - evaluation and management  Rikki Smestad [place] 08/04/2021, 5:54 AM

## 2021-08-04 NOTE — Progress Notes (Signed)
ANTICOAGULATION CONSULT NOTE  Pharmacy Consult for heparin Indication:  IABP  Allergies  Allergen Reactions   Methotrexate Derivatives Other (See Comments)    Dangerously decreased platelet count   Penicillins Itching    Tolerated Cephalosporin Date: 04/30/20.     Sulfa Antibiotics Itching    Patient Measurements: Height: 5\' 7"  (170.2 cm) Weight: 68.2 kg (150 lb 5.7 oz) IBW/kg (Calculated) : 61.6 Heparin Dosing Weight: 77kg  Vital Signs: Temp: 98.4 F (36.9 C) (10/24 1720) Temp Source: Temporal (10/24 1720) BP: 132/65 (10/24 1800) Pulse Rate: 84 (10/24 1725)  Labs: Recent Labs    08/02/21 0500 08/02/21 0944 08/02/21 1700 08/02/21 2033 08/03/21 0500 08/03/21 1739 08/04/21 0411  HGB 9.6* 10.2*  --   --  9.8*  --  10.0*  HCT 30.8* 30.0*  --   --  30.2*  --  30.6*  PLT 92*  --   --   --  77*  --  66*  LABPROT  --   --   --   --   --  15.4*  --   INR  --   --   --   --   --  1.2  --   HEPARINUNFRC 0.14*  --  0.24*  --  0.34  --  0.20*  CREATININE 2.64*  --   --  2.34* 2.14*  --  1.98*     Estimated Creatinine Clearance: 21.7 mL/min (A) (by C-G formula based on SCr of 1.98 mg/dL (H)).   Assessment: 80 yo female admitted with cardiogenic shock s/p IABP for heparin. Will remain on IABP until plan for MitraClip on Monday. No AC PTA.   Discussed heparin with Dr. Burt Knack after procedure.  Will plan to start heparin 6 hrs after sheath pull.  Pulled about 4 PM.  Goal of Therapy:  Heparin level 0.2-0.5 units/ml Monitor platelets by anticoagulation protocol: Yes   Plan:  Resume IV heparin at 950 units/hr at 10 PM. Check heparin level 8 hrs after heparin resumed. Daily CBC and HL while on heparin  Thank you for allowing pharmacy to participate in this patient's care.  [place] Crane, Roylene Reason, BCCP Clinical Pharmacist  08/04/2021 6:44 PM   [facility] pharmacy phone numbers are listed on [email or web address]

## 2021-08-04 NOTE — Anesthesia Procedure Notes (Signed)
Procedure Name: Intubation Date/Time: 08/04/2021 12:52 PM Performed by: Leonor Liv, CRNA Pre-anesthesia Checklist: Patient identified, Emergency Drugs available, Suction available and Patient being monitored Patient Re-evaluated:Patient Re-evaluated prior to induction Oxygen Delivery Method: Circle System Utilized Preoxygenation: Pre-oxygenation with 100% oxygen Induction Type: IV induction Ventilation: Mask ventilation without difficulty Laryngoscope Size: Glidescope and 3 Grade View: Grade II Tube type: Oral Tube size: 7.0 mm Number of attempts: 1 Airway Equipment and Method: Oral airway, Rigid stylet and Video-laryngoscopy Placement Confirmation: ETT inserted through vocal cords under direct vision, positive ETCO2 and breath sounds checked- equal and bilateral Secured at: 21 cm Tube secured with: Tape Dental Injury: Teeth and Oropharynx as per pre-operative assessment

## 2021-08-04 NOTE — Progress Notes (Signed)
Patient ID: [name], female   DOB: [birth year], 80 y.o.   MRN: [id number]     Advanced Heart Failure Rounding Note  PCP-Cardiologist: None   Subjective:   07/31/21 Shock . IABP/swan  placed. Intubated.  10/22 Extubated.  Atrial flutter with milrinone.   Remains on IABP at 1:1.   NSR today.  MAP elevated, has been started on hydralazine.    Creatinine down to 1.98 today.  She had a dose of IV Lasix yesterday, I/Os net negative 1437.   PLTs 130 -> 92 -> 77 -> 66  Alert/awake, complains of pain at IABP site.   Swan RA 7 PA 38/18 Thermo 3.9 Co-ox 73%  Objective:   Weight Range: 68.2 kg Body mass index is 23.55 kg/m.   Vital Signs:   Temp:  [98.6 F (37 C)-99.9 F (37.7 C)] 98.6 F (37 C) (10/24 0700) Pulse Rate:  [76-197] 87 (10/24 0700) Resp:  [13-30] 18 (10/24 0700) BP: (96-159)/(63-120) 145/104 (10/24 0700) SpO2:  [90 %-98 %] 96 % (10/24 0700) Weight:  [68.2 kg] 68.2 kg (10/24 0600) Last BM Date: 08/03/21  Weight change: Filed Weights   08/02/21 0600 08/03/21 0600 08/04/21 0600  Weight: 68.5 kg 70.2 kg 68.2 kg    Intake/Output:   Intake/Output Summary (Last 24 hours) at 08/04/2021 0743 Last data filed at 08/04/2021 0700 Gross per 24 hour  Intake 1620.4 ml  Output 3057 ml  Net -1436.6 ml      Physical Exam    General: NAD Neck: No JVD, no thyromegaly or thyroid nodule.  Lungs: Clear to auscultation bilaterally with normal respiratory effort. CV: Nondisplaced PMI.  Heart regular S1/S2, no S3/S4, with IABP sounds.  2/6 HSM apex.  No peripheral edema.   Abdomen: Soft, nontender, no hepatosplenomegaly, no distention.  Skin: Intact without lesions or rashes.  Neurologic: Alert and oriented x 3.  Psych: Normal affect. Extremities: No clubbing or cyanosis.  HEENT: Normal.    Telemetry   NSR 80s. Personally reviewed  Labs    CBC Recent Labs    08/03/21 0500 08/04/21 0411  WBC 12.9* 12.8*  HGB 9.8* 10.0*  HCT 30.2* 30.6*  MCV  97.4 96.2  PLT 77* 66*   Basic Metabolic Panel Recent Labs    08/03/21 0500 08/04/21 0411  NA 143 139  K 3.8 2.9*  CL 111 105  CO2 22 22  GLUCOSE 120* 141*  BUN 56* 49*  CREATININE 2.14* 1.98*  CALCIUM 8.5* 8.2*  MG 2.2 1.9  PHOS  --  2.8   Liver Function Tests Recent Labs    08/04/21 0411  AST 39  ALT 27  ALKPHOS 64  BILITOT 1.0  PROT 5.5*  ALBUMIN 2.4*   No results for input(s): LIPASE, AMYLASE in the last 72 hours. Cardiac Enzymes No results for input(s): CKTOTAL, CKMB, CKMBINDEX, TROPONINI in the last 72 hours.  BNP: BNP (last 3 results) Recent Labs    07/30/21 1945  BNP 2,001.3*    ProBNP (last 3 results) No results for input(s): PROBNP in the last 8760 hours.   D-Dimer No results for input(s): DDIMER in the last 72 hours. Hemoglobin A1C No results for input(s): HGBA1C in the last 72 hours. Fasting Lipid Panel No results for input(s): CHOL, HDL, LDLCALC, TRIG, CHOLHDL, LDLDIRECT in the last 72 hours. Thyroid Function Tests No results for input(s): TSH, T4TOTAL, T3FREE, THYROIDAB in the last 72 hours.  Invalid input(s): FREET3   Other results:   Imaging    No  results found.   Medications:     Scheduled Medications:  aspirin  81 mg Oral Daily   atorvastatin  80 mg Oral Daily   bisacodyl  5 mg Oral Once   chlorhexidine  1 application Topical Once   [START ON 08/05/2021] chlorhexidine  15 mL Mouth/Throat Once   chlorhexidine gluconate (MEDLINE KIT)  15 mL Mouth Rinse BID   Chlorhexidine Gluconate Cloth  6 each Topical Daily   docusate sodium  100 mg Oral BID   hydrALAZINE  25 mg Oral Q8H   insulin aspart  1-3 Units Subcutaneous Q4H   metoprolol tartrate  12.5 mg Oral BID   multivitamin with minerals  1 tablet Oral Daily   polyethylene glycol  17 g Oral Daily   predniSONE  2 mg Oral Daily   sodium chloride flush  10-40 mL Intracatheter Q12H   sodium chloride flush  3 mL Intravenous Q12H   vancomycin variable dose per unstable  renal function (pharmacist dosing)   Does not apply See admin instructions    Infusions:  amiodarone 60 mg/hr (08/04/21 0700)   ceFEPime (MAXIPIME) IV Stopped (08/03/21 1602)   heparin 950 Units/hr (08/04/21 0700)   potassium chloride 50 mL/hr at 08/04/21 0700    PRN Medications: acetaminophen **OR** acetaminophen, albuterol, hydrALAZINE, morphine injection, polyethylene glycol, sodium chloride flush, traMADol    Patient Profile   80 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 PMH h/o normal pressure hydrocephalus s/p stent, HDL, RA and former smoker.   Shock --. Cardiogenic +/- septic. Severe MR. IABP palced   Assessment/Plan   1. Mitral regurgitation: TEE showed severe, eccentric posteriorly-directed mitral regurgitation.  There was prolapse/partial flail of the A2 segment of the anterior leaflet.  There was also restriction of the posterior leaflet.  The left atrium was moderate to severely dilated, so I think that there was some degree of MR prior to the acute event.  Suspect that there was pre-existing prolapse/partial flail of A2 and patient had NSTEMI involving LCx territory, leading to lateral wall motion abnormality and restriction of the posterior leaflet with acute worsening of the mitral regurgitation. TEE does not show definite evidence for papillary muscle rupture.  Suspect that the cause of her cardiogenic shock is acute worsening of mitral regurgitation. Not surgical candidate. Hemodynamics and end-organ perfusion improved with IABP. Hydralazine added for high MAP.  Did not tolerate milrinone due to AFL. This morning, good cardiac output with CVP 7.  - Tentative plan for MitraClip today. Procedure discussed with patient.  - Continue IABP for now. Watch plts, now 66K.  - Hold off on Lasix this morning, can give dose after Mitraclip if needed.  2. AKI: Baseline creatinine < 1.0, 3.8 -> 2.6 -> 2.1 -> 1.98 today.  Suspect this was due to cardiogenic shock/ATN with  acute worsening of mitral regurgitation. Renal function improving with hemodynamic support 3. Acute diastolic CHF/cardiogenic shock: In setting of suspected acute on chronic mitral regurgitation and NSTEMI.  Elevated filling pressures on RHC, especially high PCWP (though surprisingly, she did not have prominent v-waves).   - Management with IABP for afterload reduction.  - Mitraclip today.  4. Rheumatoid arthritis: On prednisone 2 mg daily, Enbrel, and leflunomide at home.  - She is back on low dose prednisone.  - Would ideally avoid stress dose steroids if possible.  5. Acute hypoxemic respiratory failure: Pulmonary edema due to MR.  Now extubated.  - CCM following.  6. ID: Possible septic component PCT 1.92  WBC 15.3 -> 10.4 -> 12.9 -> 12.8. Blood/urine cultures NGTD.  - For now, with critical illness, covering with vancomycin/cefepime. 7. CAD: Out of hospital NSTEMI with HS-TnI up to about 16,000.  Inferolateral wall motion abnormality suggests LCx territory MI.  Suspect that there is a component of acute infarct-related mitral regurgition. No chest pain.   - No cath at this time with AKI.  - ASA 81, statin.  - Heparin gtt as above.  8. Thrombocytopenia: Platelets 130-> 92 -> 77 -> 66K today.  ?Related to IABP. HIT unlikely as drop started at same time as heparin started for IABP.  - Watch CBC closely. No bleeding currently 9. AFL, paroxysmal: Associated with milrinone. - off milrinone now.  - Continue IV heparin and b-blocker.    CRITICAL CARE Performed by: Loralie Champagne  Total critical care time: 40 minutes  Critical care time was exclusive of separately billable procedures and treating other patients.  Critical care was necessary to treat or prevent imminent or life-threatening deterioration.  Critical care was time spent personally by me (independent of midlevel providers or residents) on the following activities: development of treatment plan with patient and/or surrogate as  well as nursing, discussions with consultants, evaluation of patient's response to treatment, examination of patient, obtaining history from patient or surrogate, ordering and performing treatments and interventions, ordering and review of laboratory studies, ordering and review of radiographic studies, pulse oximetry and re-evaluation of patient's condition.   Length of Stay: [place], MD  08/04/2021, 7:43 AM  Advanced Heart Failure Team Pager [phone] (M-F; 7a - 5p)  Please contact [facility] for night-coverage after hours (5p -7a ) and weekends on [email or web address]

## 2021-08-04 NOTE — Transfer of Care (Signed)
Immediate Anesthesia Transfer of Care Note  Patient: [name]  Procedure(s) Performed: MITRAL VALVE REPAIR TRANSESOPHAGEAL ECHOCARDIOGRAM (TEE)  Patient Location: PACU  Anesthesia Type:General  Level of Consciousness: drowsy and patient cooperative  Airway & Oxygen Therapy: Patient Spontanous Breathing and Patient connected to face mask oxygen  Post-op Assessment: Report given to RN and Post -op Vital signs reviewed and stable  Post vital signs: Reviewed and stable  Last Vitals:  Vitals Value Taken Time  BP 138/56 08/04/21 1713  Temp    Pulse 83 08/04/21 1715  Resp 18 08/04/21 1715  SpO2 87 % 08/04/21 1715  Vitals shown include unvalidated device data.  Last Pain:  Vitals:   08/04/21 1200  TempSrc: Core  PainSc: 2       Patients Stated Pain Goal: 0 (73/53/29 9242)  Complications:  Encounter Notable Events  Notable Event Outcome Phase Comment  None  Intraprocedure

## 2021-08-05 ENCOUNTER — Encounter (HOSPITAL_COMMUNITY): Payer: Self-pay | Admitting: Internal Medicine

## 2021-08-05 ENCOUNTER — Other Ambulatory Visit: Payer: Self-pay | Admitting: Cardiology

## 2021-08-05 ENCOUNTER — Inpatient Hospital Stay (HOSPITAL_COMMUNITY): Payer: Medicare Other

## 2021-08-05 DIAGNOSIS — I4891 Unspecified atrial fibrillation: Secondary | ICD-10-CM | POA: Diagnosis not present

## 2021-08-05 DIAGNOSIS — I340 Nonrheumatic mitral (valve) insufficiency: Secondary | ICD-10-CM

## 2021-08-05 DIAGNOSIS — Z954 Presence of other heart-valve replacement: Secondary | ICD-10-CM

## 2021-08-05 DIAGNOSIS — J9601 Acute respiratory failure with hypoxia: Secondary | ICD-10-CM | POA: Diagnosis not present

## 2021-08-05 DIAGNOSIS — N179 Acute kidney failure, unspecified: Secondary | ICD-10-CM | POA: Diagnosis not present

## 2021-08-05 DIAGNOSIS — I5031 Acute diastolic (congestive) heart failure: Secondary | ICD-10-CM | POA: Diagnosis not present

## 2021-08-05 DIAGNOSIS — R570 Cardiogenic shock: Secondary | ICD-10-CM | POA: Diagnosis not present

## 2021-08-05 LAB — COOXEMETRY PANEL
Carboxyhemoglobin: 1.1 % (ref 0.5–1.5)
Carboxyhemoglobin: 0.7 % (ref 0.5–1.5)
Methemoglobin: 1.2 % (ref 0.0–1.5)
Methemoglobin: 1.1 % (ref 0.0–1.5)
O2 Saturation: 62 %
O2 Saturation: 55.8 %
Total hemoglobin: 9.5 g/dL — ABNORMAL LOW (ref 12.0–16.0)
Total hemoglobin: 10.2 g/dL — ABNORMAL LOW (ref 12.0–16.0)

## 2021-08-05 LAB — BASIC METABOLIC PANEL
Anion gap: 10 (ref 5–15)
BUN: 52 mg/dL — ABNORMAL HIGH (ref 8–23)
CO2: 21 mmol/L — ABNORMAL LOW (ref 22–32)
Calcium: 8.5 mg/dL — ABNORMAL LOW (ref 8.9–10.3)
Chloride: 109 mmol/L (ref 98–111)
Creatinine, Ser: 2.11 mg/dL — ABNORMAL HIGH (ref 0.44–1.00)
GFR, Estimated: 23 mL/min — ABNORMAL LOW (ref >60)
Glucose, Bld: 138 mg/dL — ABNORMAL HIGH (ref 70–99)
Potassium: 3.7 mmol/L (ref 3.5–5.1)
Sodium: 140 mmol/L (ref 135–145)

## 2021-08-05 LAB — CBC
HCT: 28.5 % — ABNORMAL LOW (ref 36.0–46.0)
Hemoglobin: 9.3 g/dL — ABNORMAL LOW (ref 12.0–15.0)
MCH: 31.1 pg (ref 26.0–34.0)
MCHC: 32.6 g/dL (ref 30.0–36.0)
MCV: 95.3 fL (ref 80.0–100.0)
Platelets: 55 10*3/uL — ABNORMAL LOW (ref 150–400)
RBC: 2.99 MIL/uL — ABNORMAL LOW (ref 3.87–5.11)
RDW: 13.5 % (ref 11.5–15.5)
WBC: 16.2 10*3/uL — ABNORMAL HIGH (ref 4.0–10.5)
nRBC: 0 % (ref 0.0–0.2)

## 2021-08-05 LAB — CULTURE, BLOOD (ROUTINE X 2)
Culture: NO GROWTH
Culture: NO GROWTH
Special Requests: ADEQUATE
Special Requests: ADEQUATE

## 2021-08-05 LAB — ECHOCARDIOGRAM LIMITED
Area-P 1/2: 1.44 cm2
Height: 67 in
MV VTI: 1.65 cm2
P 1/2 time: 152.1 msec
Weight: 2483.26 oz

## 2021-08-05 LAB — GLUCOSE, CAPILLARY
Glucose-Capillary: 137 mg/dL — ABNORMAL HIGH (ref 70–99)
Glucose-Capillary: 136 mg/dL — ABNORMAL HIGH (ref 70–99)
Glucose-Capillary: 102 mg/dL — ABNORMAL HIGH (ref 70–99)
Glucose-Capillary: 94 mg/dL (ref 70–99)
Glucose-Capillary: 95 mg/dL (ref 70–99)

## 2021-08-05 LAB — VANCOMYCIN, RANDOM: Vancomycin Rm: 20

## 2021-08-05 LAB — MAGNESIUM: Magnesium: 2 mg/dL (ref 1.7–2.4)

## 2021-08-05 LAB — HEPARIN LEVEL (UNFRACTIONATED): Heparin Unfractionated: 0.18 IU/mL — ABNORMAL LOW (ref 0.30–0.70)

## 2021-08-05 LAB — POCT ACTIVATED CLOTTING TIME: Activated Clotting Time: 150 seconds

## 2021-08-05 MED ORDER — METOPROLOL SUCCINATE ER 25 MG PO TB24
25.0000 mg | ORAL_TABLET | Freq: Every day | ORAL | Status: DC
  Administered 2021-08-05 – 2021-08-06 (×2): 25 mg via ORAL
  Filled 2021-08-05 (×2): qty 1

## 2021-08-05 MED ORDER — APIXABAN 2.5 MG PO TABS
2.5000 mg | ORAL_TABLET | Freq: Two times a day (BID) | ORAL | Status: DC
  Administered 2021-08-05 – 2021-08-09 (×8): 2.5 mg via ORAL
  Filled 2021-08-05 (×8): qty 1

## 2021-08-05 MED ORDER — HYDRALAZINE HCL 25 MG PO TABS
37.5000 mg | ORAL_TABLET | Freq: Three times a day (TID) | ORAL | Status: DC
  Administered 2021-08-05 – 2021-08-06 (×3): 37.5 mg via ORAL
  Filled 2021-08-05 (×3): qty 2

## 2021-08-05 MED ORDER — ENSURE ENLIVE PO LIQD
237.0000 mL | Freq: Three times a day (TID) | ORAL | Status: DC
  Administered 2021-08-05 – 2021-08-06 (×2): 237 mL via ORAL

## 2021-08-05 MED ORDER — SODIUM CHLORIDE 0.9 % IV SOLN
2.0000 g | INTRAVENOUS | Status: DC
  Administered 2021-08-05: 2 g via INTRAVENOUS
  Filled 2021-08-05: qty 2

## 2021-08-05 MED ORDER — FUROSEMIDE 10 MG/ML IJ SOLN
40.0000 mg | Freq: Once | INTRAMUSCULAR | Status: AC
  Administered 2021-08-05: 40 mg via INTRAVENOUS
  Filled 2021-08-05: qty 4

## 2021-08-05 MED ORDER — AMIODARONE HCL 200 MG PO TABS
200.0000 mg | ORAL_TABLET | Freq: Two times a day (BID) | ORAL | Status: DC
  Administered 2021-08-05 – 2021-08-09 (×9): 200 mg via ORAL
  Filled 2021-08-05 (×9): qty 1

## 2021-08-05 MED ORDER — ISOSORBIDE MONONITRATE ER 30 MG PO TB24
30.0000 mg | ORAL_TABLET | Freq: Every day | ORAL | Status: DC
  Administered 2021-08-05 – 2021-08-07 (×3): 30 mg via ORAL
  Filled 2021-08-05 (×3): qty 1

## 2021-08-05 MED ORDER — POTASSIUM CHLORIDE CRYS ER 20 MEQ PO TBCR
40.0000 meq | EXTENDED_RELEASE_TABLET | Freq: Once | ORAL | Status: AC
  Administered 2021-08-05: 40 meq via ORAL
  Filled 2021-08-05: qty 2

## 2021-08-05 NOTE — Progress Notes (Signed)
IABP aspirated and removed from right femoral artery. Manual pressure applied for 30 minutes. Site level 0, no s+s of hematoma. Evidence of previous catheter insertion under IABP statlock.  Tegaderm dressing applied, bedrest instructions given.   Bilateral dp and pt pulses palpable.   Bedrest begins at 12:30:00pm

## 2021-08-05 NOTE — TOC CM/SW Note (Signed)
HF TOC CM chart reviewed. Waiting PT evaluation. [facility], Heart Failure TOC CM [phone]

## 2021-08-05 NOTE — Progress Notes (Signed)
NAME:  [name], MRN:  [id number], DOB:  20-Sep-1941, LOS: 6 ADMISSION DATE:  07/30/2021, CONSULTATION DATE:  07/31/21 REFERRING MD:  Dr. Beryl Meager, CHIEF COMPLAINT:  Weakness   History of Present Illness:  80 y/o F who presented to [facility] on 10/19 with reports of weakness.   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  She reported on presentation that she had been feeling increasingly weak over the preceding four days.  She went to get a flu shot two days ago and seemed somewhat altered per family.  The weakness progressed to the point she could not sit or stand on her own which is a marked change from her baseline.  In the ER she reported shortness of breath & vague abdominal pain prior to presentation.  Initial ER evaluation notable for SBP 80-100's and new O2 need of 2-6L.  She was transitioned to BiPAP support after IVF.  Labs showed Na 133, Bicarb 18, BUN 62, Cr 4.43 (baseline 0.7), AST 112, WBC 20.4, troponin of 16, 652, lactic acid 2.62.  She was negative for COVID and influenza.  CXR showed small left pleural effusion, mild atelectasis on right & bibasilar atelectasis.  EKG showed ST changes thought to be non-specific.  She was admitted per [facility] for further work up with working diagnosis of possible PNA / infectious etiology of weakness.  She had improvement in BP and lactate with IVF + abx.  Subsequent ECHO showed an LVEF of 60-65%, LV with regional wall motion abnormalities with mid inferolateral hypokinesis, grade II diastolic dysfunction, RVSP of 63.7, LA severely dilated, RA mildly dilated, question of flail mitral valve.  ECHO findings concerning for MI with papillary muscle rupture and severe MR.  She remained on BiPAP for shortness of breath.     PCCM consulted for pulmonary evaluation.   Pertinent  Medical History  Degenerative Disease  Normal Pressure Hydrocephalus s/p Stent Neuropathy  Vertigo  HLD Depression  Former Smoker - smoked total 20 years RA  - on leflunomide, prednisone  Retired Therapist, sports   Significant Hospital Events: Including procedures, antibiotic start and stop dates in addition to other pertinent events   10/19 Admit with weakness, SOB, elevated troponin  10/20 PCCM consulted, intubated, TEE, IABP, Swan 10/21 failed SBT due to lack of respiratory drive 33/54 extubated, Afib with RVR- on amio. 10/24 mitral clip placed successfully  Interim History / Subjective:  Mitral Clip placed successfully yesterday.  MR improved from severe to moderate. Pt feels good this AM.  Eating breakfast comfortably.   IABP at 1:3.  Objective   Blood pressure (!) 146/108, pulse 78, temperature 98.2 F (36.8 C), resp. rate 14, height 5\' 7"  (1.702 m), weight 70.4 kg, SpO2 95 %. PAP: (32-48)/(13-25) 36/21 CVP:  [5 mmHg-11 mmHg] 10 mmHg PCWP:  [17 mmHg] 17 mmHg CO:  [5.6 L/min-6.6 L/min] 5.6 L/min CI:  [3.1 L/min/m2-3.7 L/min/m2] 3.1 L/min/m2      Intake/Output Summary (Last 24 hours) at 08/05/2021 0801 Last data filed at 08/05/2021 0600 Gross per 24 hour  Intake 2064.59 ml  Output 2040 ml  Net 24.59 ml    Filed Weights   08/03/21 0600 08/04/21 0600 08/05/21 0600  Weight: 70.2 kg 68.2 kg 70.4 kg    Examination: General: Chronically ill appearing woman resting comfortably.  Eating breakfast, in NAD. HENT: [place]/AT, eyes anicteric, EOMI. Lungs: breathing comfortably on [place], CTAB. Cardiovascular:  RRR, 2/6 SEM. Abdomen: BS x 4, soft NT. Extremities: RLE IABP  at 1:3 with normal distal perfusion, no cyanosis or edema. Neuro: A&O x 3, no deficits.  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  She is now s/p mitral clipping 10/24.  Not a candidate for surgical intervention. Acute HFpEF Probable NSTEMI with shock - improved with IABP and more so after mitral clipping. A.flutter - paroxysmal, associated and worsened with milrinone (now off). - IABP per cardiology, con't 1:3 with plans to  remove today. - Con't heparin. - Blood cx NGTD; con't broad antibiotics given immunosuppression history and elevated PCT.  New- onset Afib with RVR - improved with amio, currently in NSR - Continue amio, heparin, Metoprolol.  Hypertension. - Continue Hydralazine.  Acute hypoxic respiratory failure - improving. Acute pulmonary edema. Left pleural effusion with atelectasis.  -supplemental O2 to maintain SpO >90%. - 40mg  Lasix today. - Bronchial hygiene.  AKI, improving Baseline renal function normal.  Suspect pre-renal from heart failure.   -strict I/O -Follow BMP  Leukocytosis with underlying immune suppression - Doubt infectious etiology, more likely stress response from MI.  Note immune suppression with RA therapy-leflunomide, low-dose prednisone, Enbrel.  -follow blood cultures until finalized. -con't antibiotics for now with critical illness.  Rheumatoid arthritis  - Continue low-dose prednisone; may need stress dose steroids if she demonstrates signs of adrenal insufficiency (avoid if able). - Con't to hold leflunomide and Enbrel.  Normal pressure hydrocephalus with hx shunt. CT head with shunt in place, slight enlargement of ventricles.  CT Abd/Pelvis with small amt free fluid in abdomen.  - No intervention needed.  Thrombocytopenia due to IABP -monitor, expect improvement after IABP removed today.  Best Practice (right click and "Reselect all SmartList Selections" daily)  Diet/type: Regular consistency (see orders) DVT prophylaxis: systemic heparin GI prophylaxis: N/A Lines: Central line and yes and it is still needed Foley:  N/A Code Status:  DNR Last date of multidisciplinary goals of care discussion: family updated 10/20 at bedside on plan of care.  CC time: 30 min.   Montey Hora, [facility] For pager details, please see AMION or use Epic chat  After 1900, please call [place] for cross coverage needs 08/05/2021, 8:01  AM

## 2021-08-05 NOTE — Progress Notes (Addendum)
Patient ID: [name], female   DOB: [birth year], 80 y.o.   MRN: [id number]     Advanced Heart Failure Rounding Note  PCP-Cardiologist: None   Subjective:   07/31/21 Shock . IABP/swan  placed. Intubated.  10/22 Extubated.  Atrial flutter with milrinone.  10/24 S/P Mitral Clip => severe MR to moderate MR.   Remains on IABP at 1:3. Creatinine 2.11.   Currently on amio drip at 30 mg per hour + NTG drip. She is in NSR.   PLTs 130 -> 92 -> 77 -> 66 -> 55  Complaining of leg pain.   Swan #s this am  RA 8 PA 40/21 (29)  PCWP 20  SVR 1003 CO 6  CI 3.35 Co-ox 62%.   Objective:   Weight Range: 70.4 kg Body mass index is 24.31 kg/m.   Vital Signs:   Temp:  [97.5 F (36.4 C)-99 F (37.2 C)] 98.2 F (36.8 C) (10/25 0600) Pulse Rate:  [29-86] 78 (10/25 0400) Resp:  [13-30] 14 (10/25 0600) BP: (124-175)/(51-108) 146/108 (10/25 0100) SpO2:  [86 %-97 %] 95 % (10/25 0600) Arterial Line BP: (131-184)/(56-71) 151/64 (10/25 0600) Weight:  [70.4 kg] 70.4 kg (10/25 0600) Last BM Date: 08/03/21  Weight change: Filed Weights   08/03/21 0600 08/04/21 0600 08/05/21 0600  Weight: 70.2 kg 68.2 kg 70.4 kg    Intake/Output:   Intake/Output Summary (Last 24 hours) at 08/05/2021 0721 Last data filed at 08/05/2021 0600 Gross per 24 hour  Intake 2152.68 ml  Output 2165 ml  Net -12.32 ml      Physical Exam  CVP 8   General: In bed. No resp difficulty HEENT: normal Neck: supple. JVP 7-8 . Carotids 2+ bilat; no bruits. No lymphadenopathy or thryomegaly appreciated. RIJ swan  Cor: PMI nondisplaced. Regular rate & rhythm. No rubs, gallops or murmurs. Lungs: clear Abdomen: soft, nontender, nondistended. No hepatosplenomegaly. No bruits or masses. Good bowel sounds. Extremities: no cyanosis, clubbing, rash, edema. L IABP  Neuro: alert & orientedx3, cranial nerves grossly intact. moves all 4 extremities w/o difficulty. Affect pleasant    Telemetry   NSR 70-80s personally  reviewed.   Labs    CBC Recent Labs    08/04/21 0411 08/05/21 0526  WBC 12.8* 16.2*  HGB 10.0* 9.3*  HCT 30.6* 28.5*  MCV 96.2 95.3  PLT 66* 55*   Basic Metabolic Panel Recent Labs    08/04/21 0411 08/05/21 0526  NA 139 140  K 2.9* 3.7  CL 105 109  CO2 22 21*  GLUCOSE 141* 138*  BUN 49* 52*  CREATININE 1.98* 2.11*  CALCIUM 8.2* 8.5*  MG 1.9 2.0  PHOS 2.8  --    Liver Function Tests Recent Labs    08/04/21 0411  AST 39  ALT 27  ALKPHOS 64  BILITOT 1.0  PROT 5.5*  ALBUMIN 2.4*   No results for input(s): LIPASE, AMYLASE in the last 72 hours. Cardiac Enzymes No results for input(s): CKTOTAL, CKMB, CKMBINDEX, TROPONINI in the last 72 hours.  BNP: BNP (last 3 results) Recent Labs    07/30/21 1945  BNP 2,001.3*    ProBNP (last 3 results) No results for input(s): PROBNP in the last 8760 hours.   D-Dimer No results for input(s): DDIMER in the last 72 hours. Hemoglobin A1C No results for input(s): HGBA1C in the last 72 hours. Fasting Lipid Panel No results for input(s): CHOL, HDL, LDLCALC, TRIG, CHOLHDL, LDLDIRECT in the last 72 hours. Thyroid Function Tests No results for  input(s): TSH, T4TOTAL, T3FREE, THYROIDAB in the last 72 hours.  Invalid input(s): FREET3   Other results:   Imaging    CARDIAC CATHETERIZATION  Result Date: 08/05/2021 1.  Successful transcatheter mitral edge-to-edge repair with NT W clip producing severe mitral regurgitation to moderate mitral regurgitation; pulmonary vein flow reversal was no longer apparent at the conclusion of the procedure.  The results were reviewed with the advanced heart failure section.  The intra-aortic balloon pump was weaned to 1:3.  Antiplatelet therapy will be determined tomorrow depending on her platelet count.   ECHO INTRAOPERATIVE TEE  Result Date: 08/04/2021  *INTRAOPERATIVE TRANSESOPHAGEAL REPORT *  Patient Name:   [name] Date of Exam: 08/04/2021 Medical Rec #:  [id number]               Height:       67.0 in Accession #:    [id number]             Weight:       150.4 lb Date of Birth:  05-Jul-1941               BSA:          1.79 m Patient Age:    73 years               BP:           161/108 mmHg Patient Gender: F                      HR:           76 bpm. Exam Location:  Inpatient Transesophogeal exam was perform intraoperatively during surgical procedure. Patient was closely monitored under general anesthesia during the entirety of examination. 3D imaging was performed. Indications:     I34.0 Nonrheumatic mitral (valve) insufficiency Performing Phys: [id number] Woodfin Ganja THOMPSON Diagnosing Phys: Sanda Klein MD Complications: No known complications during this procedure. POST-OP IMPRESSIONS _ Left Ventricle: The left ventricle is unchanged from pre-bypass. _ Right Ventricle: The right ventricle appears unchanged from pre-bypass. _ Mitral Valve: No stenosis present. There is moderate regurgitation. Well deployed MitraClip NTW towards the lateral portion of A2-P2 (middle scallops), with good tissue bridge. There is residual regurgitation lateral to the clip. The mean transmitral gradient is 4 mm Hg at a heart rate of 70 bpm. There is systolic forward flow in both right and left upper pulmonary veins. The PHT is 123 ms. 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 _ Comments: There is no change in the trivial pericardial effusion. PRE-OP FINDINGS  Left Ventricle: The left ventricle has hyperdynamic systolic function, with an ejection fraction of >65%. The cavity size was normal. There is no increase in left ventricular wall thickness. No evidence of left ventricular regional wall motion abnormalities. Severe hypokinesis of the left ventricular, basal-mid inferolateral wall. There is no left ventricular hypertrophy.  LV Wall Scoring: The posterior wall is hypokinetic.  Right Ventricle: The right ventricle has normal systolic function. The cavity  was normal. There is no increase in right ventricular wall thickness. Right ventricular systolic pressure is normal. Catheter present in the right ventricle. A Swan-Ganz catheter traverses the RA and RV to the main pulmonary artery. Left Atrium: Left atrial size was dilated. No left atrial/left atrial appendage thrombus was detected. The left atrial appendage is well visualized and there is no evidence of thrombus present. Right Atrium: Right atrial size was normal  in size. Interatrial Septum: No atrial level shunt detected by color flow Doppler. Pericardium: Trivial pericardial effusion is present. The pericardial effusion is localized near the left atrium. 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 Mitral valve regurgitation is severe by color flow Doppler. The MR jet is posteriorly-directed. There is no evidence of mitral valve vegetation. There is moderate thickening present on the mitral valve anterior cusp with exaggerated mobility. Severe prolapse of the mitral valve A2 cusp was present. There is restricted motion of the narrow posterior mitral leaflet (maximum width 9 mm) due to posterior wall hypokinesis, as well as prolapse of the myxomatous anterior leaflet. At baseline, the effective orifice area (PISA method) is 0.36 cm2, the regurgitant volume is 64 ml. The vena contracta is 8 mm. Baseline mitral PHT is 66 ms. 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 Tricuspid valve regurgitation is mild by color flow Doppler. The jet is directed centrally. Mild prolapse of the tricuspid valve anterior cusp was present. Aortic Valve: The aortic valve is tricuspid Aortic valve regurgitation was not visualized by color flow Doppler. There is no stenosis of the aortic valve. Pulmonic Valve: The pulmonic valve was normal in structure. Pulmonic valve regurgitation is trivial by color flow Doppler. Aorta: The aortic root, ascending aorta and aortic arch are normal in size and structure. There is evidence of  plaque in the descending aorta. +--------------+--------++ LEFT VENTRICLE         +--------------+--------++ PLAX 2D                +--------------+--------++ LVOT diam:    2.17 cm  +--------------+--------++ LVOT Area:    3.71 cm +--------------+--------++                        +--------------+--------++ +---------------+---------++ RIGHT VENTRICLE          +---------------+---------++ RVSP:          30.8 mmHg +---------------+---------++ +------------+----------+++ RIGHT ATRIUM           +------------+----------+++ RA Pressure:10.00 mmHg +------------+----------+++  +-------------+---------++       +---------------+-----------++ MITRAL VALVE                 TRICUSPID VALVE            +-------------+---------++       +---------------+-----------++ MV Peak grad:8.4 mmHg        TR Peak grad:  20.8 mmHg   +-------------+---------++       +---------------+-----------++ MV Mean grad:4.5 mmHg        TR Vmax:       228.00 cm/s +-------------+---------++       +---------------+-----------++ MV Vmax:     1.44 m/s        Estimated RAP: 10.00 mmHg  +-------------+---------++       +---------------+-----------++ MV Vmean:    97.2 cm/s       RVSP:          30.8 mmHg   +-------------+---------++       +---------------+-----------++ MV VTI:      0.47 m    +-------------+---------++       +--------------+-------+ +----------------+-----------++  SHUNTS                MR Peak grad:   136.0 mmHg   +--------------+-------+ +----------------+-----------++  Systemic Diam:2.17 cm MR Mean grad:   93.0 mmHg    +--------------+-------+ +----------------+-----------++ MR Vmax:        583.00 cm/s +----------------+-----------++ MR Vmean:  457.0 cm/s  +----------------+-----------++ MR PISA:        5.67 cm    +----------------+-----------++ MR PISA Eff ROA:36 mm      +----------------+-----------++ MR PISA Radius: 0.95 cm      +----------------+-----------++  Sanda Klein MD Electronically signed by Sanda Klein MD Signature Date/Time: 08/04/2021/7:42:15 PM    Final      Medications:     Scheduled Medications:  aspirin  81 mg Oral Daily   atorvastatin  80 mg Oral Daily   chlorhexidine gluconate (MEDLINE KIT)  15 mL Mouth Rinse BID   Chlorhexidine Gluconate Cloth  6 each Topical Daily   docusate sodium  100 mg Oral BID   hydrALAZINE  25 mg Oral Q8H   insulin aspart  1-3 Units Subcutaneous Q4H   metoprolol tartrate  12.5 mg Oral BID   multivitamin with minerals  1 tablet Oral Daily   polyethylene glycol  17 g Oral Daily   predniSONE  2 mg Oral Daily   sodium chloride flush  10-40 mL Intracatheter Q12H   sodium chloride flush  3 mL Intravenous Q12H   sodium chloride flush  3 mL Intravenous Q12H   vancomycin variable dose per unstable renal function (pharmacist dosing)   Does not apply See admin instructions    Infusions:  sodium chloride 10 mL/hr at 08/05/21 0600   amiodarone 60 mg/hr (08/05/21 0600)   ceFEPime (MAXIPIME) IV 0 g (08/03/21 1602)   heparin 950 Units/hr (08/05/21 0600)   nitroGLYCERIN 30 mcg/min (08/05/21 0600)    PRN Medications: sodium chloride, acetaminophen **OR** acetaminophen, acetaminophen, albuterol, hydrALAZINE, hydrALAZINE, labetalol, morphine injection, ondansetron (ZOFRAN) IV, polyethylene glycol, sodium chloride flush, sodium chloride flush, traMADol    Patient Profile   80 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 PMH h/o normal pressure hydrocephalus s/p stent, HDL, RA and former smoker.   Shock --. Cardiogenic +/- septic. Severe MR. IABP palced   Assessment/Plan   1. Mitral regurgitation: TEE showed severe, eccentric posteriorly-directed mitral regurgitation.  There was prolapse/partial flail of the A2 segment of the anterior leaflet.  There was also restriction of the posterior leaflet.  The left atrium was moderate to severely dilated, so I  think that there was some degree of MR prior to the acute event.  Suspect that there was pre-existing prolapse/partial flail of A2 and patient had NSTEMI involving LCx territory, leading to lateral wall motion abnormality and restriction of the posterior leaflet with acute worsening of the mitral regurgitation. TEE does not show definite evidence for papillary muscle rupture.  Suspect that the cause of her cardiogenic shock is acute worsening of mitral regurgitation. Not surgical candidate. Hemodynamics and end-organ perfusion improved with IABP. Hydralazine added for high MAP.  Did not tolerate milrinone due to AFL. 10/24 S/P Mitral Clip Hemodynamics ok this morning. PCWP 20. CVP 8. Will need 40 mg IV lasix.  Remains on IABP at 1:3. Remove IABP today.    - Increase hydralazine 37.5 mg tid. Imdur 30 mg daily.  2. AKI: Baseline creatinine < 1.0, 3.8 -> 2.6 -> 2.1 -> 1.98 -->2.1   Suspect this was due to cardiogenic shock/ATN with acute worsening of mitral regurgitation. Renal function improving with hemodynamic support 3. Acute diastolic CHF/cardiogenic shock: In setting of suspected acute on chronic mitral regurgitation and NSTEMI.  Elevated filling pressures on RHC, especially high PCWP (though surprisingly, she did not have prominent v-waves).   - Management with IABP for afterload reduction.  - S/P Mitraclip today.  4. Rheumatoid arthritis: On prednisone 2 mg daily, Enbrel, and leflunomide at home.  - She is back on low dose prednisone.  - Would ideally avoid stress dose steroids if possible.  5. Acute hypoxemic respiratory failure: Pulmonary edema due to MR.  Now extubated.  - CCM following.  - Sats stable on 4 liters [place].  6. ID: Possible septic component PCT 1.92 WBC 15.3 -> 10.4 -> 12.9 -> 12.8->16.2  Blood/urine cultures NGTD.  - For now, with critical illness, covering with vancomycin/cefepime. 7. CAD: Out of hospital NSTEMI with HS-TnI up to about 16,000.  Inferolateral wall motion  abnormality suggests LCx territory MI.  Suspect that there is a component of acute infarct-related mitral regurgition. No chest pain.   - No cath at this time with AKI.  - ASA 81, statin.  - Heparin gtt as above.  8. Thrombocytopenia: Platelets 130-> 92 -> 77 -> 66-->55 K today.  ?Related to IABP. HIT unlikely as drop started at same time as heparin started for IABP.  - Watch CBC closely. No bleeding currently 9. AFL, paroxysmal: Associated with milrinone. - off milrinone now.  - Continue IV heparin and b-blocker.   Length of Stay: Goodell, NP  08/05/2021, 7:21 AM  Advanced Heart Failure Team Pager [phone] (M-F; 7a - 5p)  Please contact [facility] for night-coverage after hours (5p -7a ) and weekends on [email or web address]  Patient seen with NP, agree with the above note.   S/p Mitraclip yesterday, MR severe => moderate.  IABP has been weaned to 1:3.  CI 3.4 with co-ox 62%.  PCWP mildly elevated at 20 with normal RA pressure from [place].  Platelets lower at 55K.  Creatinine fairly stable at 2.1.  BP elevated this morning.   General: NAD Neck: No JVD, no thyromegaly or thyroid nodule.  Lungs: Clear to auscultation bilaterally with normal respiratory effort. CV: Nondisplaced PMI.  Heart regular S1/S2, no S3/S4, IABP sounds.  No peripheral edema.  N Abdomen: Soft, nontender, no hepatosplenomegaly, no distention.  Skin: Intact without lesions or rashes.  Neurologic: Alert and oriented x 3.  Psych: Normal affect. Extremities: No clubbing or cyanosis.  HEENT: Normal.   This morning, will stop heparin gtt and remove IABP.  Will likely need prolonged groin hold with low platelets (55K).   Will give Lasix 40 mg IV x 1 today.   Increase hydralazine to 37.5 mg tid and will try to wean down on NTG gtt.  Consolidate metoprolol tartrate to Toprol XL 25 mg daily.   She remains in NSR, can stop amiodarone gtt this morning and start po amiodarone.   After IABP out, will transition her to  Eliquis 2.5 mg bid (age and creatinine).  With low platelets, will avoid post-MI DAPT.   With elevated creatinine and competed MI, no plan at this point for coronary angiography.   Suspect we can stop antibiotics soon.   CRITICAL CARE Performed by: Loralie Champagne  Total critical care time: 40 minutes  Critical care time was exclusive of separately billable procedures and treating other patients.  Critical care was necessary to treat or prevent imminent or life-threatening deterioration.  Critical care was time spent personally by me on the following activities: development of treatment plan with patient and/or surrogate as well as nursing, discussions with consultants, evaluation of patient's response to treatment, examination of patient, obtaining history from patient or surrogate, ordering and performing treatments and interventions, ordering and review of laboratory studies, ordering and review of radiographic  studies, pulse oximetry and re-evaluation of patient's condition.  Loralie Champagne 08/05/2021 7:46 AM

## 2021-08-05 NOTE — TOC Progression Note (Addendum)
Transition of Care [facility]) - Progression Note    Patient Details  Name: [name] MRN: [id number] Date of Birth: August 02, 1941  [facility] CM/SW [place], [place] Phone Number: 08/05/2021, 2:48 PM  Clinical Narrative:    HF CSW received a call from [place] at [facility] that Mrs. Blassingame is from [facility] and if she will need rehab at discharge? CSW informed Whitney that PT and OT have not evaulated Mrs. Givler yet but once they have the CSW will keep Whitney informed.  CSW will continue to follow throughout discharge.     Barriers to Discharge: Continued Medical Work up  Expected Discharge Plan and Services   In-house Referral: Clinical Social Work     Living arrangements for the past 2 months: [place]                                       Social Determinants of Health (SDOH) Interventions    Readmission Risk Interventions No flowsheet data found. Kashara Blocher, MSW, [place] Heart Failure Social Worker

## 2021-08-05 NOTE — Progress Notes (Signed)
Nutrition Follow-up  DOCUMENTATION CODES:   Not applicable  INTERVENTION:   Continue Multivitamin w/ minerals daily  Ensure Enlive po TID, each supplement provides 350 kcal and 20 grams of protein  Recommend liberalizing patients diet to regular due to poor PO intake. Received ok from MD.  NUTRITION DIAGNOSIS:   Inadequate oral intake related to inability to eat as evidenced by NPO status. - Progressing, pt now on a diet  GOAL:   Patient will meet greater than or equal to 90% of their needs - Ongoing  MONITOR:   PO intake, Supplement acceptance, Weight trends, I & O's  REASON FOR ASSESSMENT:   Ventilator    ASSESSMENT:   80 y/o F who presented to [facility] on 10/19 with reports of weakness.  10/22: Extubated  10/23: Full Liquid Diet 10/24: HH/CM diet  Pt laying in bed, husband at bedside. Pt seems very lethargic and used one word replies to questions.   Pt reports that it is things have not been going good. Pt reports that prior to admission she was doing well. Pt reports that it is easier for her to drink then to eat.   Per EMR, pt has ate 25% of her lunch and dinner on 10/23.   Pt reports that she had no weight loss prior to admission.   Discussed ONS with pt; pt agreeable to Ensure.   Medications reviewed and include: Colace, SSI 1-3 units q4h, MVI, Miralax, Prednisone, IV antibiotics Labs reviewed: BUN 52, Creatinine 2.11, 24 hr BG trends 125-137  Admission Weight: 75.8 kg Current Weight: 70.4 kg  UOP: 2165 mL x 24 hr I/O's: -3819 mL since admit   Diet Order:   Diet Order             Diet heart healthy/carb modified Room service appropriate? Yes; Fluid consistency: Thin  Diet effective now                   EDUCATION NEEDS:   No education needs have been identified at this time  Skin:  Skin Assessment: Reviewed RN Assessment  Last BM:  08/03/2021  Height:   Ht Readings from Last 1 Encounters:  07/31/21 5\' 7"  (1.702 m)    Weight:    Wt Readings from Last 1 Encounters:  08/05/21 70.4 kg    Ideal Body Weight:  61.4 kg  BMI:  Body mass index is 24.31 kg/m.  Estimated Nutritional Needs:   Kcal:  1600-1800  Protein:  80-95 grams  Fluid:  >/= 1.6 L    Esaw Knippel BS, PLDN Clinical Dietitian See AMiON for contact information.

## 2021-08-05 NOTE — Progress Notes (Addendum)
ANTICOAGULATION CONSULT NOTE  Pharmacy Consult for heparin Indication:  IABP  Allergies  Allergen Reactions   Methotrexate Derivatives Other (See Comments)    Dangerously decreased platelet count   Penicillins Itching    Tolerated Cephalosporin Date: 04/30/20.     Sulfa Antibiotics Itching    Patient Measurements: Height: 5\' 7"  (170.2 cm) Weight: 68.2 kg (150 lb 5.7 oz) IBW/kg (Calculated) : 61.6 Heparin Dosing Weight: 77kg  Vital Signs: Temp: 98.1 F (36.7 C) (10/25 0400) BP: 146/108 (10/25 0100) Pulse Rate: 78 (10/25 0400)  Labs: Recent Labs    08/03/21 0500 08/03/21 1739 08/04/21 0411 08/05/21 0526  HGB 9.8*  --  10.0* 9.3*  HCT 30.2*  --  30.6* 28.5*  PLT 77*  --  66* 55*  LABPROT  --  15.4*  --   --   INR  --  1.2  --   --   HEPARINUNFRC 0.34  --  0.20* 0.18*  CREATININE 2.14*  --  1.98* 2.11*     Estimated Creatinine Clearance: 20.3 mL/min (A) (by C-G formula based on SCr of 2.11 mg/dL (H)).   Assessment: 80 yo female admitted with cardiogenic shock s/p IABP for heparin s/p MitraClip on 10/23. No AC PTA.   Heparin started 10/23, 6 hrs after sheath pull. Heparin level just below therapeutic at 0.18 this morning. IABP weaned to 1:3 in the cath lab. Heparin level drawn about 45 minutes early. Patient was previously therapeutic at the current rate. Will continue at 950 units/hour and recheck heparin level in 8 hours. Platelet count decreased to 55 this morning. Hemoglobin is stable at 9.3.  Goal of Therapy:  Heparin level 0.2-0.5 units/ml Monitor platelets by anticoagulation protocol: Yes   Plan:  Continue IV heparin at 950 units/hr  Check heparin level 6-8 hrs  Daily CBC and HL while on heparin  Thank you for allowing pharmacy to participate in this patient's care.  Reatha Harps, PharmD PGY1 Pharmacy Resident 08/05/2021 6:22 AM Check [email or web address] for unit specific pharmacy number  ADDENDUM  Plan to pull IABP today. Stop heparin and start Eliquis 8  hours post IABP removal.

## 2021-08-05 NOTE — Progress Notes (Addendum)
[place] VALVE TEAM  Patient Name: [name] Date of Encounter: 08/05/2021  Primary Cardiologist: Dr. Henrene Hawking Problem List     Principal Problem:   Acute respiratory failure with hypoxia [facility]) Active Problems:   Normal pressure hydrocephalus (HCC)   Generalized weakness   Community acquired pneumonia of left lower lobe of lung   AKI (acute kidney injury) ([place])   HTN (hypertension)   Acute heart failure with preserved ejection fraction (HFpEF) ([place])   Nonrheumatic mitral valve regurgitation  Subjective   Pt with no specific complaints today however seems somewhat fatigued with no interest in conversation.   Inpatient Medications    Scheduled Meds:  amiodarone  200 mg Oral BID   atorvastatin  80 mg Oral Daily   chlorhexidine gluconate (MEDLINE KIT)  15 mL Mouth Rinse BID   Chlorhexidine Gluconate Cloth  6 each Topical Daily   docusate sodium  100 mg Oral BID   furosemide  40 mg Intravenous Once   hydrALAZINE  37.5 mg Oral Q8H   insulin aspart  1-3 Units Subcutaneous Q4H   isosorbide mononitrate  30 mg Oral Daily   metoprolol succinate  25 mg Oral Daily   multivitamin with minerals  1 tablet Oral Daily   polyethylene glycol  17 g Oral Daily   potassium chloride  40 mEq Oral Once   predniSONE  2 mg Oral Daily   sodium chloride flush  10-40 mL Intracatheter Q12H   sodium chloride flush  3 mL Intravenous Q12H   sodium chloride flush  3 mL Intravenous Q12H   vancomycin variable dose per unstable renal function (pharmacist dosing)   Does not apply See admin instructions   Continuous Infusions:  sodium chloride 10 mL/hr at 08/05/21 0600   ceFEPime (MAXIPIME) IV 0 g (08/03/21 1602)   nitroGLYCERIN 30 mcg/min (08/05/21 0600)   PRN Meds: sodium chloride, acetaminophen **OR** acetaminophen, acetaminophen, albuterol, hydrALAZINE, hydrALAZINE, labetalol, morphine injection, ondansetron (ZOFRAN) IV,  polyethylene glycol, sodium chloride flush, sodium chloride flush, traMADol   Vital Signs    Vitals:   08/05/21 0200 08/05/21 0300 08/05/21 0400 08/05/21 0600  BP:      Pulse: (!) 31 77 78   Resp: (!) _0 Temp: 98.2 F (36.8 C) 98.1 F (36.7 C) 98.1 F (36.7 C) 98.2 F (36.8 C)  TempSrc:      SpO2: 95% 95% 91% 95%  Weight:    70.4 kg  Height:        Intake/Output Summary (Last 24 hours) at 08/05/2021 0808 Last data filed at 08/05/2021 0600 Gross per 24 hour  Intake 2064.59 ml  Output 2040 ml  Net 24.59 ml   Filed Weights   08/03/21 0600 08/04/21 0600 08/05/21 0600  Weight: 70.2 kg 68.2 kg 70.4 kg   Physical Exam    General: Ill appearing, NAD Neck: No JVD Lungs:Diminished in bases. No wheezes. Breathing is mildly labored on exam  Cardiovascular: RRR with S1 S2. + murmur Abdomen: Soft, non-tender, non-distended. No obvious abdominal masses. Extremities: No edema. 1+ DP pulses bilaterally  Neuro: Alert and oriented. No focal deficits. No facial asymmetry. MAE spontaneously. Psych: Responds to questions appropriately with normal affect.    Labs    CBC Recent Labs    08/04/21 0411 08/05/21 0526  WBC 12.8* 16.2*  HGB 10.0* 9.3*  HCT 30.6* 28.5*  MCV 96.2 95.3  PLT 66* 55*   Basic  Metabolic Panel Recent Labs    08/04/21 0411 08/05/21 0526  NA 139 140  K 2.9* 3.7  CL 105 109  CO2 22 21*  GLUCOSE 141* 138*  BUN 49* 52*  CREATININE 1.98* 2.11*  CALCIUM 8.2* 8.5*  MG 1.9 2.0  PHOS 2.8  --    Liver Function Tests Recent Labs    08/04/21 0411  AST 39  ALT 27  ALKPHOS 64  BILITOT 1.0  PROT 5.5*  ALBUMIN 2.4*   No results for input(s): LIPASE, AMYLASE in the last 72 hours. Cardiac Enzymes No results for input(s): CKTOTAL, CKMB, CKMBINDEX, TROPONINI in the last 72 hours. BNP Invalid input(s): POCBNP D-Dimer No results for input(s): DDIMER in the last 72 hours. Hemoglobin A1C No results for input(s): HGBA1C in the last 72  hours. Fasting Lipid Panel No results for input(s): CHOL, HDL, LDLCALC, TRIG, CHOLHDL, LDLDIRECT in the last 72 hours. Thyroid Function Tests No results for input(s): TSH, T4TOTAL, T3FREE, THYROIDAB in the last 72 hours.  Invalid input(s): FREET3  Telemetry    NSR with stable rates - Personally Reviewed  ECG    No new tracing as of 08/05/21 - Personally Reviewed/  Radiology    CARDIAC CATHETERIZATION  Result Date: 08/05/2021 1.  Successful transcatheter mitral edge-to-edge repair with NT W clip producing severe mitral regurgitation to moderate mitral regurgitation; pulmonary vein flow reversal was no longer apparent at the conclusion of the procedure.  The results were reviewed with the advanced heart failure section.  The intra-aortic balloon pump was weaned to 1:3.  Antiplatelet therapy will be determined tomorrow depending on her platelet count.   ECHO INTRAOPERATIVE TEE  Result Date: 08/04/2021  *INTRAOPERATIVE TRANSESOPHAGEAL REPORT *  Patient Name:   [name] Date of Exam: 08/04/2021 Medical Rec #:  [id number]              Height:       67.0 in Accession #:    [id number]             Weight:       150.4 lb Date of Birth:  [birth year]               BSA:          1.79 m Patient Age:    73 years               BP:           161/108 mmHg Patient Gender: F                      HR:           76 bpm. Exam Location:  Inpatient Transesophogeal exam was perform intraoperatively during surgical procedure. Patient was closely monitored under general anesthesia during the entirety of examination. 3D imaging was performed. Indications:     I34.0 Nonrheumatic mitral (valve) insufficiency Performing Phys: [id number] Woodfin Ganja THOMPSON Diagnosing Phys: Sanda Klein MD Complications: No known complications during this procedure. POST-OP IMPRESSIONS _ Left Ventricle: The left ventricle is unchanged from pre-bypass. _ Right Ventricle: The right ventricle appears unchanged from pre-bypass. _  Mitral Valve: No stenosis present. There is moderate regurgitation. Well deployed MitraClip NTW towards the lateral portion of A2-P2 (middle scallops), with good tissue bridge. There is residual regurgitation lateral to the clip. The mean transmitral gradient is 4 mm Hg at a heart rate of 70 bpm. There is systolic forward flow in both right and left upper  pulmonary veins. The PHT is 123 ms. 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 _ Comments: There is no change in the trivial pericardial effusion. PRE-OP FINDINGS  Left Ventricle: The left ventricle has hyperdynamic systolic function, with an ejection fraction of >65%. The cavity size was normal. There is no increase in left ventricular wall thickness. No evidence of left ventricular regional wall motion abnormalities. Severe hypokinesis of the left ventricular, basal-mid inferolateral wall. There is no left ventricular hypertrophy.  LV Wall Scoring: The posterior wall is hypokinetic.  Right Ventricle: The right ventricle has normal systolic function. The cavity was normal. There is no increase in right ventricular wall thickness. Right ventricular systolic pressure is normal. Catheter present in the right ventricle. A Swan-Ganz catheter traverses the RA and RV to the main pulmonary artery. Left Atrium: Left atrial size was dilated. No left atrial/left atrial appendage thrombus was detected. The left atrial appendage is well visualized and there is no evidence of thrombus present. Right Atrium: Right atrial size was normal in size. Interatrial Septum: No atrial level shunt detected by color flow Doppler. Pericardium: Trivial pericardial effusion is present. The pericardial effusion is localized near the left atrium. 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 Mitral valve regurgitation is severe by color flow Doppler. The MR jet is posteriorly-directed. There is no evidence of mitral valve vegetation. There is  moderate thickening present on the mitral valve anterior cusp with exaggerated mobility. Severe prolapse of the mitral valve A2 cusp was present. There is restricted motion of the narrow posterior mitral leaflet (maximum width 9 mm) due to posterior wall hypokinesis, as well as prolapse of the myxomatous anterior leaflet. At baseline, the effective orifice area (PISA method) is 0.36 cm2, the regurgitant volume is 64 ml. The vena contracta is 8 mm. Baseline mitral PHT is 66 ms. 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 Tricuspid valve regurgitation is mild by color flow Doppler. The jet is directed centrally. Mild prolapse of the tricuspid valve anterior cusp was present. Aortic Valve: The aortic valve is tricuspid Aortic valve regurgitation was not visualized by color flow Doppler. There is no stenosis of the aortic valve. Pulmonic Valve: The pulmonic valve was normal in structure. Pulmonic valve regurgitation is trivial by color flow Doppler. Aorta: The aortic root, ascending aorta and aortic arch are normal in size and structure. There is evidence of plaque in the descending aorta. +--------------+--------++ LEFT VENTRICLE         +--------------+--------++ PLAX 2D                +--------------+--------++ LVOT diam:    2.17 cm  +--------------+--------++ LVOT Area:    3.71 cm +--------------+--------++                        +--------------+--------++ +---------------+---------++ RIGHT VENTRICLE          +---------------+---------++ RVSP:          30.8 mmHg +---------------+---------++ +------------+----------+++ RIGHT ATRIUM           +------------+----------+++ RA Pressure:10.00 mmHg +------------+----------+++  +-------------+---------++       +---------------+-----------++ MITRAL VALVE                 TRICUSPID VALVE            +-------------+---------++       +---------------+-----------++ MV Peak grad:8.4 mmHg        TR Peak grad:  20.8 mmHg    +-------------+---------++       +---------------+-----------++  MV Mean grad:4.5 mmHg        TR Vmax:       228.00 cm/s +-------------+---------++       +---------------+-----------++ MV Vmax:     1.44 m/s        Estimated RAP: 10.00 mmHg  +-------------+---------++       +---------------+-----------++ MV Vmean:    97.2 cm/s       RVSP:          30.8 mmHg   +-------------+---------++       +---------------+-----------++ MV VTI:      0.47 m    +-------------+---------++       +--------------+-------+ +----------------+-----------++  SHUNTS                MR Peak grad:   136.0 mmHg   +--------------+-------+ +----------------+-----------++  Systemic Diam:2.17 cm MR Mean grad:   93.0 mmHg    +--------------+-------+ +----------------+-----------++ MR Vmax:        583.00 cm/s +----------------+-----------++ MR Vmean:       457.0 cm/s  +----------------+-----------++ MR PISA:        5.67 cm    +----------------+-----------++ MR PISA Eff ROA:36 mm      +----------------+-----------++ MR PISA Radius: 0.95 cm     +----------------+-----------++  Sanda Klein MD Electronically signed by Sanda Klein MD Signature Date/Time: 08/04/2021/7:42:15 PM    Final     Cardiac Studies   TEER 08/04/21:  Successful transcatheter mitral edge-to-edge repair with NT W clip producing severe mitral regurgitation to moderate mitral regurgitation; pulmonary vein flow reversal was no longer apparent at the conclusion of the procedure.  The results were reviewed with the advanced heart failure section.  The intra-aortic balloon pump was weaned to 1:3.  Antiplatelet therapy will be determined tomorrow depending on her platelet count.  TEE 07/31/21:   1. Left ventricular ejection fraction, by estimation, is 55%. The left  ventricle has normal function. The left ventricle demonstrates regional  wall motion abnormalities with hypokinesis of the mid lateral wall.   2.  Right ventricular systolic function is normal. The right ventricular  size is normal.   3. The mitral valve is abnormal. Severe mitral valve regurgitation. There  was prolapse/partial flail of the A2 segment as well as posterior leaflet  restriction likely from the lateral wall hypokinesis. Suspect combination  of infarct-related MR and MR  due to partial flail A2 with eccentric posteriorly-directed jet. I was  worried about rupture of papillary muscle but no definite evidence for  this. The dilated left atrium suggests that there was some pre-existing  mitral regurgitation that may have  become acutely worse with MI involving inferolateral wall. No evidence of  mitral stenosis. The mean mitral valve gradient is 3.0 mmHg.   4. Peak RV-RA gradient 26 mmHg.   5. The aortic valve is tricuspid. Aortic valve regurgitation is not  visualized. No aortic stenosis is present.   6. No ASD or PFO by color doppler.   7. Left atrial size was moderately dilated. No left atrial/left atrial  appendage thrombus was detected.   Patient Profile     [name] is a 80 y.o. female with a hx of pressure hydrocephalus s/p stent, RA on Enbrel, leflunomide and chronic prednisone, HTN, HLD and former tobacco abuse who is being seen today for the evaluation of severe MR at the request of Dr. Aundra Dubin.  Assessment & Plan    1. Severe Mitral Regurgitation: TEE performed this admission that showed severe, eccentric mitral  regurgitation with prolapse/partial flail of the A2 segment of the anterior leaf. Patient underwent TEER 08/04/2021 with successful repair with single NTW clip. IABP weaned to 1:3. Plan to hold antiplatelet therapy due to thrombocytopenia 130, 92, 77, 66, 55 (today). Defer DAPT and treat with anticoagulation given her history of atrial fibrillation/flutter.  Plan for repeat echocardiogram this morning. Per Dr. Aundra Dubin, IABP to be discontinued this morning>>likely will need prolonged groin hold  due to platelets. Will make 1 month follow-up with structural heart team and repeat echocardiogram at that time.   2. Cardiogenic shock with acute diastolic CHF: Suspect in the setting of acute on chronic mitral regurgitation and NSTEMI. Elevated filling pressures on RHC with elevated PCWP. IABP/swan placed 07/31/21>> to be removed today. Off pressors.   3. Atrial fibrillation/flutter: Treated with IV amiodarone>>>maintaining NSR today.  Deferring antiplatelet therapy given thrombocytopenia for anticoagulation.  4. HTN: BP elevated post procedure>started on IV NTG per anesthesia however planning to wean NTG with the addition of Toprol   5.  AKI: Baseline appears to be in the 1.0 range.  Peak creatinine at 4.43 on 07/30/2021.  Improved to 2.11 today. Avoid nephrotoxic medications.  6.  Thrombocytopenia: Platelets 130>> 92>> 77>> 66>> 55 today felt to possibly be related to IABP insertion and less likely HIT.  Deferring DAPT for now given the need for anticoagulation with atrial fibrillation/flutter.    Signed, Kathyrn Drown, NP  08/05/2021, 8:08 AM  Pager [phone]    ATTENDING ATTESTATION:  After conducting a review of all available clinical information with the care team, interviewing the patient, and performing a physical exam, I agree with the findings and plan described in this note.  Patient now s/p Mitraclip x 1 NTW with resultant moderate MR and no pulmonary vein flow reversal.  I reviewed the TTE done today that confirms this.  IABP has been removed and pressors weaned.  Currently on Nitro gtt given elevated BP.  Med management being optimized.  Discussed with HF team, given low platelets, will defer antiplatelets for med rx of inferolateral MI and treat with anticoagulation for PAF only.  Will arrange follow up in [facility].    Lenna Sciara, MD

## 2021-08-05 NOTE — Progress Notes (Signed)
Pharmacy Antibiotic Note  [name] is a 80 y.o. female admitted on 07/30/2021 with shock and renal failure. Pharmacy has been consulted for vancomycin and cefepime dosing.   Scr now down to 2.64 (CrCl 16 mL/min). PCT 2.25 to 1.92 on 10/22. Afebrile. Vancomycin random this morning came back at 20. Patient received 750 mg with mitraclip procedure yesterday. Creatinine increased this morning possibly due to the MitraClip procedure. Will plan to check a vancomycin random 10/26 and perform 2 point kinetic calculations.  Plan: Continue Cefepime 2g IV q24h Vancomycin random level 10/26 at 0500 to perform 2 point kinetics Watch renal function, will order VR 10/26   Height: 5\' 7"  (170.2 cm) Weight: 70.4 kg (155 lb 3.3 oz) IBW/kg (Calculated) : 61.6  Temp (24hrs), Avg:98.3 F (36.8 C), Min:97.5 F (36.4 C), Max:99 F (37.2 C)  Recent Labs  Lab 07/30/21 1351 07/30/21 1550 07/31/21 0453 07/31/21 2113 08/01/21 0339 08/01/21 0339 08/02/21 0500 08/02/21 2033 08/03/21 0500 08/04/21 0411 08/05/21 0526  WBC 20.4*  --    < >  --  15.3*  --  10.4  --  12.9* 12.8* 16.2*  CREATININE 4.43*  --    < >  --  3.40*  --  2.64* 2.34* 2.14* 1.98* 2.11*  LATICACIDVEN 2.6* 2.0*  --  1.2  --   --   --   --   --   --   --   VANCORANDOM  --   --   --   --   --    < > 14  --   --  15 20   < > = values in this interval not displayed.     Estimated Creatinine Clearance: 20.3 mL/min (A) (by C-G formula based on SCr of 2.11 mg/dL (H)).    Allergies  Allergen Reactions   Methotrexate Derivatives Other (See Comments)    Dangerously decreased platelet count   Penicillins Itching    Tolerated Cephalosporin Date: 04/30/20.     Sulfa Antibiotics Itching    Antimicrobials this admission: Ceftriaxone 10/19 x1 Azithromycin 10/19 x1 Vancomycin 10/20 >> Cefepime 10/20 >>   Microbiology results: 10/20 Bcx: NGTD 10/19 BCx: NGTD 10/19 UCx: NGTD 10/19 MRSA PCR: negative  Thank you for  allowing pharmacy to participate in this patient's care.  Reatha Harps, PharmD PGY1 Pharmacy Resident 08/05/2021 10:59 AM Check [email or web address] for unit specific pharmacy number

## 2021-08-05 NOTE — Progress Notes (Addendum)
   IABP removed ~ 1230 . Start eliquis 2.5 mg twice a day tonight.   CVP 5  CO 7  Remains on Nitro drip 30 . Will start to wean.   Dylon Correa NP-C  2:54 PM

## 2021-08-05 NOTE — Progress Notes (Signed)
  Echocardiogram 2D Echocardiogram limited s/p mitral clip has been performed.  [name] M 08/05/2021, 10:45 AM

## 2021-08-05 NOTE — Progress Notes (Signed)
[facility] Progress Note Patient Name: [name] DOB: 1941-02-12 MRN: [id number]   Date of Service  08/05/2021  HPI/Events of Note  PA Catheter trace looks over damped. Distal migration?  eICU Interventions  Will request that PCCM ground team reposition catheter when they get a chance.      Intervention Category Major Interventions: Other:  Lysle Dingwall 08/05/2021, 10:42 PM

## 2021-08-05 NOTE — Progress Notes (Signed)
RT note. Patient currently not requiring bipap at this time, RT will continue to monitor.

## 2021-08-06 ENCOUNTER — Inpatient Hospital Stay (HOSPITAL_COMMUNITY): Payer: Medicare Other

## 2021-08-06 DIAGNOSIS — I5031 Acute diastolic (congestive) heart failure: Secondary | ICD-10-CM | POA: Diagnosis not present

## 2021-08-06 DIAGNOSIS — N179 Acute kidney failure, unspecified: Secondary | ICD-10-CM | POA: Diagnosis not present

## 2021-08-06 DIAGNOSIS — R0902 Hypoxemia: Secondary | ICD-10-CM

## 2021-08-06 DIAGNOSIS — J9601 Acute respiratory failure with hypoxia: Secondary | ICD-10-CM | POA: Diagnosis not present

## 2021-08-06 DIAGNOSIS — I340 Nonrheumatic mitral (valve) insufficiency: Secondary | ICD-10-CM

## 2021-08-06 LAB — COOXEMETRY PANEL
Carboxyhemoglobin: 1 % (ref 0.5–1.5)
Methemoglobin: 0.9 % (ref 0.0–1.5)
O2 Saturation: 63.9 %
Total hemoglobin: 14 g/dL (ref 12.0–16.0)

## 2021-08-06 LAB — CBC
HCT: 27.5 % — ABNORMAL LOW (ref 36.0–46.0)
Hemoglobin: 8.9 g/dL — ABNORMAL LOW (ref 12.0–15.0)
MCH: 31.3 pg (ref 26.0–34.0)
MCHC: 32.4 g/dL (ref 30.0–36.0)
MCV: 96.8 fL (ref 80.0–100.0)
Platelets: 59 10*3/uL — ABNORMAL LOW (ref 150–400)
RBC: 2.84 MIL/uL — ABNORMAL LOW (ref 3.87–5.11)
RDW: 13.6 % (ref 11.5–15.5)
WBC: 14.4 10*3/uL — ABNORMAL HIGH (ref 4.0–10.5)
nRBC: 0 % (ref 0.0–0.2)

## 2021-08-06 LAB — BASIC METABOLIC PANEL
Anion gap: 8 (ref 5–15)
BUN: 57 mg/dL — ABNORMAL HIGH (ref 8–23)
CO2: 23 mmol/L (ref 22–32)
Calcium: 8.5 mg/dL — ABNORMAL LOW (ref 8.9–10.3)
Chloride: 109 mmol/L (ref 98–111)
Creatinine, Ser: 2.09 mg/dL — ABNORMAL HIGH (ref 0.44–1.00)
GFR, Estimated: 23 mL/min — ABNORMAL LOW (ref >60)
Glucose, Bld: 108 mg/dL — ABNORMAL HIGH (ref 70–99)
Potassium: 3.5 mmol/L (ref 3.5–5.1)
Sodium: 140 mmol/L (ref 135–145)

## 2021-08-06 LAB — GLUCOSE, CAPILLARY
Glucose-Capillary: 116 mg/dL — ABNORMAL HIGH (ref 70–99)
Glucose-Capillary: 109 mg/dL — ABNORMAL HIGH (ref 70–99)
Glucose-Capillary: 116 mg/dL — ABNORMAL HIGH (ref 70–99)
Glucose-Capillary: 123 mg/dL — ABNORMAL HIGH (ref 70–99)

## 2021-08-06 LAB — MAGNESIUM: Magnesium: 1.8 mg/dL (ref 1.7–2.4)

## 2021-08-06 LAB — VANCOMYCIN, RANDOM: Vancomycin Rm: 14

## 2021-08-06 MED ORDER — HYDRALAZINE HCL 50 MG PO TABS
50.0000 mg | ORAL_TABLET | Freq: Three times a day (TID) | ORAL | Status: DC
  Administered 2021-08-06 – 2021-08-07 (×3): 50 mg via ORAL
  Filled 2021-08-06 (×3): qty 1

## 2021-08-06 MED ORDER — INSULIN ASPART 100 UNIT/ML IJ SOLN: 1.0000 [IU] | Freq: Three times a day (TID) | INTRAMUSCULAR | Status: DC

## 2021-08-06 MED ORDER — FUROSEMIDE 10 MG/ML IJ SOLN
40.0000 mg | Freq: Once | INTRAMUSCULAR | Status: AC
  Administered 2021-08-06: 40 mg via INTRAVENOUS
  Filled 2021-08-06: qty 4

## 2021-08-06 MED ORDER — POTASSIUM CHLORIDE 20 MEQ PO PACK
20.0000 meq | PACK | Freq: Once | ORAL | Status: AC
  Administered 2021-08-06: 20 meq via ORAL
  Filled 2021-08-06: qty 1

## 2021-08-06 MED ORDER — POTASSIUM CHLORIDE CRYS ER 20 MEQ PO TBCR: 20.0000 meq | EXTENDED_RELEASE_TABLET | Freq: Once | ORAL | Status: DC

## 2021-08-06 MED ORDER — MAGNESIUM SULFATE 2 GM/50ML IV SOLN
2.0000 g | Freq: Once | INTRAVENOUS | Status: AC
  Administered 2021-08-06: 2 g via INTRAVENOUS
  Filled 2021-08-06: qty 50

## 2021-08-06 NOTE — NC FL2 (Signed)
[place] LEVEL OF CARE SCREENING TOOL     IDENTIFICATION  Patient Name: [name] Birthdate: 1941/01/12 Sex: female Admission Date (Current Location): 07/30/2021  [facility] and [place] Number:  Herbalist and Address:  [facility], [place] [address], [place], [place] [address]      Provider Number: [id number]  Attending Physician Name and Address:  Kipp Brood, MD  Relative Name and Phone Number:       Current Level of Care: Hospital Recommended Level of Care:   Prior Approval Number:    Date Approved/Denied:   PASRR Number: [id number]  Discharge Plan: SNF    Current Diagnoses: Patient Active Problem List   Diagnosis Date Noted   Hypoxia    Nonrheumatic mitral valve regurgitation    Acute heart failure with preserved ejection fraction (HFpEF) ([place])    Generalized weakness 07/30/2021   Acute respiratory failure with hypoxia ([place]) 07/30/2021   Community acquired pneumonia of left lower lobe of lung 07/30/2021   AKI (acute kidney injury) (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    Orientation RESPIRATION BLADDER Height & Weight     Self, Place  O2 (2L) Continent, Indwelling catheter Weight: 160 lb 15 oz (73 kg) Height:  5\' 7"  (170.2 cm)  BEHAVIORAL SYMPTOMS/MOOD NEUROLOGICAL BOWEL NUTRITION STATUS      Incontinent Diet (See DC Summary)  AMBULATORY STATUS COMMUNICATION OF NEEDS Skin   Extensive Assist Verbally Normal                       Personal Care Assistance  Level of Assistance  Bathing, Feeding, Dressing Bathing Assistance: Limited assistance Feeding assistance: Independent Dressing Assistance: Limited assistance     Functional Limitations Info  Sight, Hearing, Speech Sight Info: Impaired Hearing Info: Adequate Speech Info: Adequate    SPECIAL CARE FACTORS FREQUENCY  PT (By licensed PT), OT (By licensed OT)     PT Frequency: 5x/week OT Frequency: 5x/week            Contractures Contractures Info: Not present    Additional Factors Info  Code Status, Allergies, Insulin Sliding Scale Code Status Info: DNR Allergies Info: Methotrexate Derivatives, Penicillins, Sulfa Antibiotics   Insulin Sliding Scale Info: See Med List       Current Medications (08/06/2021):  This is the current hospital active medication list Current Facility-Administered Medications  Medication Dose Route Frequency Provider Last Rate Last Admin   acetaminophen (TYLENOL) tablet 650 mg  650 mg Oral Q6H PRN Kathyrn Drown D, NP       Or   acetaminophen (TYLENOL) suppository 650 mg  650 mg Rectal Q6H PRN Kathyrn Drown D, NP       acetaminophen (TYLENOL) tablet 650 mg  650 mg Oral Q4H PRN Kathyrn Drown D, NP       albuterol (PROVENTIL) (2.5 MG/3ML) 0.083% nebulizer solution 2.5 mg  2.5 mg Nebulization Q6H PRN Kathyrn Drown D, NP       amiodarone (PACERONE) tablet 200 mg  200 mg Oral BID Larey Dresser, MD  200 mg at 08/06/21 1055   apixaban (ELIQUIS) tablet 2.5 mg  2.5 mg Oral BID Larey Dresser, MD   2.5 mg at 08/06/21 1054   atorvastatin (LIPITOR) tablet 80 mg  80 mg Oral Daily Kathyrn Drown D, NP   80 mg at 08/06/21 1054   Chlorhexidine Gluconate Cloth 2 % PADS 6 each  6 each Topical Daily Kathyrn Drown D, NP   6 each at 08/06/21 1056   docusate sodium (COLACE) capsule 100 mg  100 mg Oral BID Kathyrn Drown D, NP   100 mg at 08/06/21 1054   feeding supplement (ENSURE ENLIVE / ENSURE PLUS) liquid 237 mL  237 mL Oral TID BM Kipp Brood, MD   237 mL  at 08/06/21 1056   hydrALAZINE (APRESOLINE) injection 5 mg  5 mg Intravenous Q6H PRN Kathyrn Drown D, NP   5 mg at 08/06/21 0442   hydrALAZINE (APRESOLINE) tablet 50 mg  50 mg Oral Q8H Clegg, Amy D, NP   50 mg at 08/06/21 1417   insulin aspart (novoLOG) injection 1-3 Units  1-3 Units Subcutaneous TID AC & HS Agarwala, Einar Grad, MD       isosorbide mononitrate (IMDUR) 24 hr tablet 30 mg  30 mg Oral Daily Clegg, Amy D, NP   30 mg at 08/06/21 1054   labetalol (NORMODYNE) injection 10 mg  10 mg Intravenous Q10 min PRN Kathyrn Drown D, NP   10 mg at 08/06/21 0719   metoprolol succinate (TOPROL-XL) 24 hr tablet 25 mg  25 mg Oral Daily Larey Dresser, MD   25 mg at 08/06/21 1054   multivitamin with minerals tablet 1 tablet  1 tablet Oral Daily Kathyrn Drown D, NP   1 tablet at 08/06/21 1054   nitroGLYCERIN 50 mg in dextrose 5 % 250 mL (0.2 mg/mL) infusion  0-100 mcg/min Intravenous Titrated Sherren Mocha, MD   Stopped at 08/05/21 2315   ondansetron (ZOFRAN) injection 4 mg  4 mg Intravenous Q6H PRN Kathyrn Drown D, NP       polyethylene glycol (MIRALAX / GLYCOLAX) packet 17 g  17 g Oral Daily Kathyrn Drown D, NP   17 g at 08/03/21 0930   predniSONE (DELTASONE) tablet 2 mg  2 mg Oral Daily Kathyrn Drown D, NP   2 mg at 08/06/21 1054   sodium chloride flush (NS) 0.9 % injection 10-40 mL  10-40 mL Intracatheter Q12H Kathyrn Drown D, NP   10 mL at 08/06/21 1055   sodium chloride flush (NS) 0.9 % injection 10-40 mL  10-40 mL Intracatheter PRN Kathyrn Drown D, NP       sodium chloride flush (NS) 0.9 % injection 3 mL  3 mL Intravenous Q12H Kathyrn Drown D, NP   3 mL at 08/06/21 1056   traMADol (ULTRAM) tablet 50 mg  50 mg Oral Q12H PRN Tommie Raymond, NP   50 mg at 08/05/21 1957     Discharge Medications: Please see discharge summary for a list of discharge medications.  Relevant Imaging Results:  Relevant Lab Results:   Additional Information SSN#: [id number] Moderna COVID-19 Vaccine  08/07/2020, 11/07/2019 PFIZER Comrnaty(Gray TOP) Covid-19 Vaccine 06/25/2021  [name], LCSWA

## 2021-08-06 NOTE — TOC Progression Note (Addendum)
Transition of Care [facility]) - Progression Note    Patient Details  Name: [name] MRN: [id number] Date of Birth: 01/08/1941  Transition of Care [facility]) CM/SW [place], [place] Phone Number: 08/06/2021, 4:37 PM  Clinical Narrative:    HF CSW spoke with Whitney at [place] to inform her of PT's recommendation for SNF at time of discharge and Whitney reported she will of course have a spot available for [name] upon discharge. CSW to speak with [name] regarding PT's recommendations.  HF CSW attempted to speak with [name] at bedside regarding PT's recommendation for SNF and [name] didn't speak or respond to the CSW and shook her head no to rehab at [place]. CSW will follow up tomorrow with [name] and family.  CSW will continue to follow throughout discharge.   Barriers to Discharge: Continued Medical Work up  Expected Discharge Plan and Services   In-house Referral: Clinical Social Work     Living arrangements for the past 2 months: [name]                                       Social Determinants of Health (SDOH) Interventions    Readmission Risk Interventions No flowsheet data found.  Jayven Naill, MSW, [place] Heart Failure Social Worker

## 2021-08-06 NOTE — Progress Notes (Addendum)
Patient ID: [name], female   DOB: [birth year], 80 y.o.   MRN: [id number]     Advanced Heart Failure Rounding Note  PCP-Cardiologist: None   Subjective:   07/31/21 Shock . IABP/swan  placed. Intubated.  10/22 Extubated.  Atrial flutter with milrinone.  10/24 S/P Mitral Clip => severe MR to moderate MR.  10/25 IABP removed. NTG drip wean off.   PLTs 130 -> 92 -> 77 -> 66 -> 55->59  Creatinine 1.98>2.1> 2.1  Having a hard time sleeping.  Swan #s this am  RA 6 CO 5.4  CI 3 Co-ox 64%.   Objective:   Weight Range: 73 kg Body mass index is 25.21 kg/m.   Vital Signs:   Temp:  [98.4 F (36.9 C)-99 F (37.2 C)] 98.8 F (37.1 C) (10/26 0700) Pulse Rate:  [75-102] 89 (10/26 0600) Resp:  [17-33] 27 (10/26 0700) BP: (142-148)/(62-80) 148/80 (10/26 0700) SpO2:  [89 %-98 %] 90 % (10/26 0700) Arterial Line BP: (116-173)/(53-78) 173/78 (10/26 0700) FiO2 (%):  [3 %] 3 % (10/26 0800) Weight:  [73 kg] 73 kg (10/26 0500) Last BM Date: 08/05/21  Weight change: Filed Weights   08/05/21 0600 08/06/21 0000 08/06/21 0500  Weight: 70.4 kg 73 kg 73 kg    Intake/Output:   Intake/Output Summary (Last 24 hours) at 08/06/2021 0935 Last data filed at 08/06/2021 0700 Gross per 24 hour  Intake 430.28 ml  Output 2295 ml  Net -1864.72 ml      Physical Exam  CVP 6 General:  No resp difficulty HEENT: normal Neck: supple. no JVD. Carotids 2+ bilat; no bruits. No lymphadenopathy or thryomegaly appreciated. RIJ swan. LIJ Cor: PMI nondisplaced. Regular rate & rhythm. No rubs, gallops or murmurs. Lungs: clear Abdomen: soft, nontender, nondistended. No hepatosplenomegaly. No bruits or masses. Good bowel sounds. Extremities: no cyanosis, clubbing, rash, edema Neuro: alert & orientedx3, cranial nerves grossly intact. moves all 4 extremities w/o difficulty. Affect pleasant   Telemetry   NSR 70-80s   Labs    CBC Recent Labs    08/05/21 0526 08/06/21 0435  WBC 16.2* 14.4*   HGB 9.3* 8.9*  HCT 28.5* 27.5*  MCV 95.3 96.8  PLT 55* 59*   Basic Metabolic Panel Recent Labs    08/04/21 0411 08/05/21 0526 08/06/21 0435  NA 139 140 140  K 2.9* 3.7 3.5  CL 105 109 109  CO2 22 21* 23  GLUCOSE 141* 138* 108*  BUN 49* 52* 57*  CREATININE 1.98* 2.11* 2.09*  CALCIUM 8.2* 8.5* 8.5*  MG 1.9 2.0 1.8  PHOS 2.8  --   --    Liver Function Tests Recent Labs    08/04/21 0411  AST 39  ALT 27  ALKPHOS 64  BILITOT 1.0  PROT 5.5*  ALBUMIN 2.4*   No results for input(s): LIPASE, AMYLASE in the last 72 hours. Cardiac Enzymes No results for input(s): CKTOTAL, CKMB, CKMBINDEX, TROPONINI in the last 72 hours.  BNP: BNP (last 3 results) Recent Labs    07/30/21 1945  BNP 2,001.3*    ProBNP (last 3 results) No results for input(s): PROBNP in the last 8760 hours.   D-Dimer No results for input(s): DDIMER in the last 72 hours. Hemoglobin A1C No results for input(s): HGBA1C in the last 72 hours. Fasting Lipid Panel No results for input(s): CHOL, HDL, LDLCALC, TRIG, CHOLHDL, LDLDIRECT in the last 72 hours. Thyroid Function Tests No results for input(s): TSH, T4TOTAL, T3FREE, THYROIDAB in the last 72 hours.  Invalid input(s): FREET3   Other results:   Imaging    ECHOCARDIOGRAM LIMITED  Result Date: 08/05/2021    ECHOCARDIOGRAM LIMITED REPORT   Patient Name:   [name] Date of Exam: 08/05/2021 Medical Rec #:  [id number]              Height:       67.0 in Accession #:    [id number]             Weight:       155.2 lb Date of Birth:  1941/04/19               BSA:          1.816 m Patient Age:    56 years               BP:           146/108 mmHg Patient Gender: F                      HR:           87 bpm. Exam Location:  Inpatient Procedure: Limited Echo, Limited Color Doppler and Cardiac Doppler Indications:    S/P Mitral Valve Clip implantation Z98.890  History:        Patient has prior history of Echocardiogram examinations, most                  recent 08/04/2021. Community acquired pneumonia of left lower                 lobe of lung. Acute heart failure with preserved ejection                 fraction.                  Mitral Valve: Mitral Clip NTW valve is present in the mitral                 position. Procedure Date: 08/04/2021.  Sonographer:    Darlina Sicilian RDCS Referring Phys: [place]  1. Left ventricular ejection fraction, by estimation, is 60 to 65%. The left ventricle has normal function. The left ventricle has no regional wall motion abnormalities.  2. Right ventricular systolic function is normal. The right ventricular size is normal. There is mildly elevated pulmonary artery systolic pressure.  3. Left atrial size was moderately dilated.  4. There is a Mitral Clip NTW present in the mitral position. Procedure Date: 08/04/2021.     Well seated, stable MitraClip attached to both mitral leaflets. There is residual moderate mitral insufficiency lateral to the clip. Mild-moderate elevation in mitral gradients due to clip and residual MR. Mild mitral stenosis. The mean mitral valve gradient is 6.3 mmHg at 80 bpm. Pressure half time 152 ms.  5. The aortic valve is normal in structure. Aortic valve regurgitation is not visualized. No aortic stenosis is present.  6. The inferior vena cava is normal in size with greater than 50% respiratory variability, suggesting right atrial pressure of 3 mmHg. FINDINGS  Left Ventricle: Left ventricular ejection fraction, by estimation, is 60 to 65%. The left ventricle has normal function. The left ventricle has no regional wall motion abnormalities. The left ventricular internal cavity size was normal in size. There is  no left ventricular hypertrophy. Right Ventricle: The right ventricular size is normal. No increase in right ventricular wall thickness. Right ventricular systolic function is normal.  There is mildly elevated pulmonary artery systolic pressure. The tricuspid regurgitant  velocity is 3.02  m/s, and with an assumed right atrial pressure of 3 mmHg, the estimated right ventricular systolic pressure is 42.7 mmHg. Left Atrium: Left atrial size was moderately dilated. Right Atrium: Right atrial size was normal in size. Pericardium: There is no evidence of pericardial effusion. Mitral Valve: Well seated, stable MitraClip attached to both mitral leaflets. There is residual moderate mitral insufficiency lateral to the clip. Mild-moderate elevation in mitral gradients due to clip and residual MR. Pressure half time 152 ms. The mitral valve has been repaired/replaced. Moderate mitral valve regurgitation, with eccentric laterally directed jet. There is a Mitral Clip NTW present in the mitral position. Procedure Date: 08/04/2021. Mild mitral valve stenosis. MV peak gradient, 9.9 mmHg. The mean mitral valve gradient is 6.3 mmHg. Tricuspid Valve: The tricuspid valve is normal in structure. Tricuspid valve regurgitation is mild . No evidence of tricuspid stenosis. Aortic Valve: The aortic valve is normal in structure. Aortic valve regurgitation is not visualized. No aortic stenosis is present. Pulmonic Valve: The pulmonic valve was normal in structure. Pulmonic valve regurgitation is not visualized. No evidence of pulmonic stenosis. Aorta: The aortic root is normal in size and structure. Venous: The inferior vena cava is normal in size with greater than 50% respiratory variability, suggesting right atrial pressure of 3 mmHg. IAS/Shunts: The interatrial septum was not well visualized. There is no interatrial shunting in parasternal or apical views. LEFT VENTRICLE PLAX 2D LVOT diam:     2.20 cm LV SV:         81 LV SV Index:   45 LVOT Area:     3.80 cm  AORTIC VALVE             PULMONIC VALVE LVOT Vmax:   119.07 cm/s RVOT Peak grad: 4 mmHg LVOT Vmean:  77.400 cm/s LVOT VTI:    0.213 m MITRAL VALVE                TRICUSPID VALVE MV Area (PHT): 1.44 cm     TR Peak grad:   36.5 mmHg MV Area VTI:   1.65  cm     TR Vmax:        302.00 cm/s MV Peak grad:  9.9 mmHg MV Mean grad:  6.3 mmHg     SHUNTS MV Vmax:       1.58 m/s     Systemic VTI:  0.21 m MV Vmean:      112.6 cm/s   Systemic Diam: 2.20 cm MV PHT:        152.15 msec  Pulmonic VTI:  0.190 m MV Decel Time: 528 msec Mihai Croitoru MD Electronically signed by Sanda Klein MD Signature Date/Time: 08/05/2021/10:58:50 AM    Final      Medications:     Scheduled Medications:  amiodarone  200 mg Oral BID   apixaban  2.5 mg Oral BID   atorvastatin  80 mg Oral Daily   Chlorhexidine Gluconate Cloth  6 each Topical Daily   docusate sodium  100 mg Oral BID   feeding supplement  237 mL Oral TID BM   furosemide  40 mg Intravenous Once   hydrALAZINE  37.5 mg Oral Q8H   insulin aspart  1-3 Units Subcutaneous TID AC & HS   isosorbide mononitrate  30 mg Oral Daily   metoprolol succinate  25 mg Oral Daily   multivitamin with minerals  1 tablet Oral Daily  polyethylene glycol  17 g Oral Daily   potassium chloride  20 mEq Oral Once   predniSONE  2 mg Oral Daily   sodium chloride flush  10-40 mL Intracatheter Q12H   sodium chloride flush  3 mL Intravenous Q12H   vancomycin variable dose per unstable renal function (pharmacist dosing)   Does not apply See admin instructions    Infusions:  ceFEPime (MAXIPIME) IV Stopped (08/05/21 1649)   nitroGLYCERIN Stopped (08/05/21 2315)    PRN Medications: acetaminophen **OR** acetaminophen, acetaminophen, albuterol, hydrALAZINE, labetalol, ondansetron (ZOFRAN) IV, sodium chloride flush, traMADol    Patient Profile   80 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 PMH h/o normal pressure hydrocephalus s/p stent, HDL, RA and former smoker.   Shock --. Cardiogenic +/- septic. Severe MR. IABP palced   Assessment/Plan   1. Mitral regurgitation: TEE showed severe, eccentric posteriorly-directed mitral regurgitation.  There was prolapse/partial flail of the A2 segment of the anterior leaflet.   There was also restriction of the posterior leaflet.  The left atrium was moderate to severely dilated, so I think that there was some degree of MR prior to the acute event.  Suspect that there was pre-existing prolapse/partial flail of A2 and patient had NSTEMI involving LCx territory, leading to lateral wall motion abnormality and restriction of the posterior leaflet with acute worsening of the mitral regurgitation. TEE does not show definite evidence for papillary muscle rupture.  Suspect that the cause of her cardiogenic shock is acute worsening of mitral regurgitation. Not surgical candidate. Hemodynamics and end-organ perfusion improved with IABP. Hydralazine added for high MAP.  Did not tolerate milrinone due to AFL. 10/24 S/P Mitral Clip - IABP removed 10/25  . CO-OX 64%. Stable hemodynamics.  - Increase hydralazine 50 mg tid. Imdur 30 mg daily.  2. AKI: Baseline creatinine < 1.0, 3.8 -> 2.6 -> 2.1 -> 1.98 -->2.1-->2.1    Suspect this was due to cardiogenic shock/ATN with acute worsening of mitral regurgitation. Renal function improving with hemodynamic support 3. Acute diastolic CHF/cardiogenic shock: In setting of suspected acute on chronic mitral regurgitation and NSTEMI.  Elevated filling pressures on RHC, especially high PCWP (though surprisingly, she did not have prominent v-waves).   - IABP removed 10/25.  - S/P Mitraclip 10/24  4. Rheumatoid arthritis: On prednisone 2 mg daily, Enbrel, and leflunomide at home.  - She is back on low dose prednisone.  - Would ideally avoid stress dose steroids if possible.  5. Acute hypoxemic respiratory failure: Pulmonary edema due to MR.  Now extubated.  - CCM following.  - Sats stable on 4 liters [place].  6. ID: Possible septic component PCT 1.92 WBC 15.3 -> 10.4 -> 12.9 -> 12.8->16.2->14  Blood/urine cultures NGTD.  - For now, with critical illness, covering with vancomycin/cefepime. 7. CAD: Out of hospital NSTEMI with HS-TnI up to about 16,000.   Inferolateral wall motion abnormality suggests LCx territory MI.  Suspect that there is a component of acute infarct-related mitral regurgition. No chest pain.   - No cath at this time with AKI.  - ASA 81, statin.  - Heparin gtt as above.  8. Thrombocytopenia: Platelets 130-> 92 -> 77 -> 66-->55 -->59 Follow CBC 9. AFL, paroxysmal: Associated with milrinone. - off milrinone now.  - Continue IV heparin and b-blocker.   Remove swan today.   Length of Stay: [place], NP  08/06/2021, 9:35 AM  Advanced Heart Failure Team Pager [phone] (M-F; 7a - 5p)  Please contact [facility] for night-coverage after hours (5p -7a ) and weekends on [email or web address]  Patient seen with NP, agree with the above note.   Echo with EF 60-65%, RV normal, MV repair s/p Mitraclip with moderate MR.   Good cardiac output by Luiz Blare with CVP 6.  BP elevated.  No complaints other than poor sleep.  Afebrile.  Creatinine stable at 2.09.   Platelets now trending up with IABP out. She remains in NSR.   General: NAD Neck: No JVD, no thyromegaly or thyroid nodule.  Lungs: Clear to auscultation bilaterally with normal respiratory effort. CV: Nondisplaced PMI.  Heart regular S1/S2, no S3/S4, no murmur.  No peripheral edema.   Abdomen: Soft, nontender, no hepatosplenomegaly, no distention.  Skin: Intact without lesions or rashes.  Neurologic: Alert and oriented x 3.  Psych: Normal affect. Extremities: No clubbing or cyanosis.  HEENT: Normal.   Remove Swan today.  Agree with increasing hydralazine with elevated BP.  Continue Imdur 30.  She does not need a diuretic today.   Platelets starting to improve with IABP out.  Continue apixaban.   No plan for coronary angiography with completed MI and no chest pain in setting of elevated creatinine.   Discussed with CCM, can stop antibiotics today.   Loralie Champagne 08/06/2021 10:15 AM

## 2021-08-06 NOTE — Evaluation (Signed)
Physical Therapy Evaluation Patient Details Name: [name] MRN: [id number] DOB: 12-08-1940 Today's Date: 08/06/2021  History of Present Illness  Pt is 80 yo female presented on 07/30/21 with weakness.  Pt with pleural effusion, possible PNE, elevated troponin - suspected silent MI with worsening MR, shock, afib with RVR, resp failure.  She was intubated 10/20-10/22, 10/20 TEE, IABP, Swan; 10/24 mitral clip placed; 10/25 IABP removed; 10/26 Swan removed. Pt with hx including RA, hydrocephalus s/p stent, neuropathy, vertigo, HLD, depression.  Clinical Impression  Pt admitted with above diagnosis. At baseline, pt is independent with ADLs (mostly) and ambulates with RW.  She lives with spouse in independent living at [place].  Today, pt presents with severe weakness throughout (3/5 MMT), easily fatigued, and requiring mod A x 2 for transfers.  All VSS on 2 L O2.  She will benefit from acute PT services and recommend return to SNF.  Also recommend OT consult. Pt currently with functional limitations due to the deficits listed below (see PT Problem List). Pt will benefit from skilled PT to increase their independence and safety with mobility to allow discharge to the venue listed below.          Recommendations for follow up therapy are one component of a multi-disciplinary discharge planning process, led by the attending physician.  Recommendations may be updated based on patient status, additional functional criteria and insurance authorization.  Follow Up Recommendations Skilled nursing-short term rehab (<3 hours/day)    Assistance Recommended at Discharge Frequent or constant Supervision/Assistance  Functional Status Assessment Patient has had a recent decline in their functional status and demonstrates the ability to make significant improvements in function in a reasonable and predictable amount of time.  Equipment Recommendations  None recommended by PT    Recommendations for  Other Services       Precautions / Restrictions Precautions Precautions: Fall      Mobility  Bed Mobility Overal bed mobility: Needs Assistance Bed Mobility: Supine to Sit     Supine to sit: Mod assist;+2 for physical assistance     General bed mobility comments: Pt was up with nursing at arrival- report assist of 2 required    Transfers Overall transfer level: Needs assistance Equipment used: Rolling walker (2 wheels) Transfers: Sit to/from Stand Sit to Stand: Mod assist;+2 physical assistance           General transfer comment: Performed sit to stand x 2 with cues for pushing up, RW use, foot placement, leaning forward, standing tall - feet tending to extend and pt pushing back    Ambulation/Gait             General Gait Details: unable  Stairs            Wheelchair Mobility    Modified Rankin (Stroke Patients Only)       Balance Overall balance assessment: Needs assistance Sitting-balance support: Bilateral upper extremity supported Sitting balance-Leahy Scale: Poor Sitting balance - Comments: requiring UE support and fatigued easily   Standing balance support: Bilateral upper extremity supported Standing balance-Leahy Scale: Poor Standing balance comment: requiring mod A x 2 and RW                             Pertinent Vitals/Pain Pain Assessment: No/denies pain    Home Living Family/patient expects to be discharged to:: Private residence (at [place]) Living Arrangements: Spouse/significant other Available Help at Discharge: Family;Available 24 hours/day Type of  Home: House Home Access: Level entry       Home Layout: One level Home Equipment: Conservation officer, nature (2 wheels);Grab bars - tub/shower;Cane - single point;Wheelchair Probation officer (4 wheels);BSC      Prior Function Prior Level of Function : Independent/Modified Independent;Needs assist       Physical Assist : ADLs (physical)   ADLs (physical):  Dressing Mobility Comments: Pt ambulated with RW in home and short community distances ADLs Comments: Spouse reports occasionally needs help with shoes; but otherwise independent ADLs     Hand Dominance        Extremity/Trunk Assessment   Upper Extremity Assessment Upper Extremity Assessment: LUE deficits/detail;RUE deficits/detail RUE Deficits / Details: ROM WFL ; MMT 3/5 throughout LUE Deficits / Details: ROM WFL ; MMT 3/5 throughout    Lower Extremity Assessment Lower Extremity Assessment: LLE deficits/detail;RLE deficits/detail RLE Deficits / Details: ROM WFL ; MMT 3/5 throughout LLE Deficits / Details: ROM WFL ; MMT 3/5 throughout    Cervical / Trunk Assessment Cervical / Trunk Assessment: Normal;Other exceptions Cervical / Trunk Exceptions: Very weak - fatigued easily and would "flop" back in chair  Communication   Communication: HOH  Cognition Arousal/Alertness: Awake/alert Behavior During Therapy: Flat affect Overall Cognitive Status: Impaired/Different from baseline Area of Impairment: Orientation;Attention;Following commands;Awareness;Problem solving                 Orientation Level: Disoriented to;Place;Time;Situation Current Attention Level: Sustained   Following Commands: Follows one step commands inconsistently   Awareness: Intellectual Problem Solving: Slow processing;Decreased initiation;Difficulty sequencing;Requires verbal cues;Requires tactile cues General Comments: Pt answered PLOF questions correctly (confirmed with spouse); however, could not state date, upcoming holiday, president, or location; slow to respond with increased cues required        General Comments General comments (skin integrity, edema, etc.): VSS on 2L O2; Spouse present and end of session discussed therapy role and recommendations. He reports they [facility] at [facility] and would be interested in SNF there    Exercises     Assessment/Plan    PT Assessment Patient needs  continued PT services  PT Problem List Decreased strength;Decreased mobility;Decreased safety awareness;Decreased coordination;Decreased activity tolerance;Decreased cognition;Cardiopulmonary status limiting activity;Decreased balance;Decreased knowledge of use of DME       PT Treatment Interventions DME instruction;Therapeutic activities;Gait training;Therapeutic exercise;Patient/family education;Balance training;Functional mobility training;Cognitive remediation    PT Goals (Current goals can be found in the Care Plan section)  Acute Rehab PT Goals Patient Stated Goal: agreeable to rehab PT Goal Formulation: With patient/family Time For Goal Achievement: 08/20/21 Potential to Achieve Goals: Good Additional Goals Additional Goal #1: Will increase LE strength to 4/5 for improved transfers and gait    Frequency Min 2X/week   Barriers to discharge        Co-evaluation               AM-PAC PT "6 Clicks" Mobility  Outcome Measure Help needed turning from your back to your side while in a flat bed without using bedrails?: A Lot Help needed moving from lying on your back to sitting on the side of a flat bed without using bedrails?: Total Help needed moving to and from a bed to a chair (including a wheelchair)?: Total Help needed standing up from a chair using your arms (e.g., wheelchair or bedside chair)?: Total Help needed to walk in hospital room?: Total Help needed climbing 3-5 steps with a railing? : Total 6 Click Score: 7    End of Session Equipment Utilized During  Treatment: Gait belt Activity Tolerance: Patient limited by fatigue Patient left: in chair;with call bell/phone within reach;with nursing/sitter in room;with family/visitor present Nurse Communication: Mobility status PT Visit Diagnosis: Unsteadiness on feet (R26.81);Muscle weakness (generalized) (M62.81)    Time: 1400-1420 PT Time Calculation (min) (ACUTE ONLY): 20 min   Charges:   PT Evaluation $PT  Eval Moderate Complexity: 1 Melina Schools, PT Acute Rehab Services Pager [phone] [facility] [phone]   Karlton Lemon 08/06/2021, 2:36 PM

## 2021-08-06 NOTE — Progress Notes (Signed)
  Echocardiogram 2D Echocardiogram has been performed.  [name] F 08/06/2021, 12:30 PM

## 2021-08-06 NOTE — Progress Notes (Signed)
NAME:  [name], MRN:  [id number], DOB:  [birth year], LOS: 7 ADMISSION DATE:  07/30/2021, CONSULTATION DATE:  07/31/21 REFERRING MD:  Dr. Beryl Meager, CHIEF COMPLAINT:  Weakness   History of Present Illness:  80 y/o F who presented to [facility] on 10/19 with reports of weakness.   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  She reported on presentation that she had been feeling increasingly weak over the preceding four days.  She went to get a flu shot two days ago and seemed somewhat altered per family.  The weakness progressed to the point she could not sit or stand on her own which is a marked change from her baseline.  In the ER she reported shortness of breath & vague abdominal pain prior to presentation.  Initial ER evaluation notable for SBP 80-100's and new O2 need of 2-6L.  She was transitioned to BiPAP support after IVF.  Labs showed Na 133, Bicarb 18, BUN 62, Cr 4.43 (baseline 0.7), AST 112, WBC 20.4, troponin of 16, 652, lactic acid 2.62.  She was negative for COVID and influenza.  CXR showed small left pleural effusion, mild atelectasis on right & bibasilar atelectasis.  EKG showed ST changes thought to be non-specific.  She was admitted per [facility] for further work up with working diagnosis of possible PNA / infectious etiology of weakness.  She had improvement in BP and lactate with IVF + abx.  Subsequent ECHO showed an LVEF of 60-65%, LV with regional wall motion abnormalities with mid inferolateral hypokinesis, grade II diastolic dysfunction, RVSP of 63.7, LA severely dilated, RA mildly dilated, question of flail mitral valve.  ECHO findings concerning for MI with papillary muscle rupture and severe MR.  She remained on BiPAP for shortness of breath.     PCCM consulted for pulmonary evaluation.   Pertinent  Medical History  Degenerative Disease  Normal Pressure Hydrocephalus s/p Stent Neuropathy  Vertigo  HLD Depression  Former Smoker - smoked total 20 years RA  - on leflunomide, prednisone  Retired Therapist, sports   Significant Hospital Events: Including procedures, antibiotic start and stop dates in addition to other pertinent events   10/19 Admit with weakness, SOB, elevated troponin  10/20 PCCM consulted, intubated, TEE, IABP, Swan 10/21 failed SBT due to lack of respiratory drive 30/86 extubated, Afib with RVR- on amio. 10/24 mitral clip placed successfully 10/25 IABP removed, started on Eliquis  Interim History / Subjective:  Off all drips. States dressing and PA cath site is uncomfortable. PA cath not reading correctly, showing high pressures and poor waveform.  Can likely d/c today.  Objective   Blood pressure (!) 142/62, pulse 82, temperature 98.8 F (37.1 C), resp. rate 20, height 5\' 7"  (1.702 m), weight 73 kg, SpO2 92 %. PAP: (31-42)/(12-33) 42/33 CVP:  [2 mmHg-13 mmHg] 11 mmHg CO:  [6.7 L/min] 6.7 L/min CI:  [3.7 L/min/m2-3.9 L/min/m2] 3.9 L/min/m2      Intake/Output Summary (Last 24 hours) at 08/06/2021 0720 Last data filed at 08/06/2021 0400 Gross per 24 hour  Intake 602.45 ml  Output 2410 ml  Net -1807.55 ml    Filed Weights   08/04/21 0600 08/05/21 0600 08/06/21 0000  Weight: 68.2 kg 70.4 kg 73 kg    Examination: General: Chronically ill appearing woman resting comfortably.  In NAD HENT: [place]/AT, eyes anicteric, EOMI. Lungs: breathing comfortably on [place], CTAB. Cardiovascular:  RRR, 2/6 SEM. Abdomen: BS x 4, soft NT. Extremities: No deformities, 1+ edema.  Neuro: A&O x 3, no deficits.  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  She is now s/p mitral clipping 10/24.  Not a candidate for surgical intervention.  Repeat echo 10/25 after mitral clip with normal EF and improved MR. Acute HFpEF Probable NSTEMI with shock - improved with IABP and more so after mitral clipping.  IABP removed 10/25 A.flutter - paroxysmal, associated and worsened with milrinone (now off). - Continue  supportive care. - Continue Eliquis. - Can likely d/c PA cath, defer to CHF team. - Blood cx NGTD; con't broad antibiotics given immunosuppression history and elevated PCT.  New- onset Afib with RVR - resolved with amio, currently in NSR - Continue Eliquis, AMIO, Metoprolol.  Hypertension. - Continue Hydralazine, Imdur.  Acute hypoxic respiratory failure - improving. Acute pulmonary edema. Left pleural effusion with atelectasis.  - Supplemental O2 to maintain SpO >90%. - Additional 1 dose 40mg  Lasix now.  Might could benefit from another dose later today, will defer to CHF team. - Bronchial hygiene.  AKI, improving Baseline renal function normal.  Suspect pre-renal from heart failure.   -strict I/O -Follow BMP  Leukocytosis with underlying immune suppression - Doubt infectious etiology, more likely stress response from MI.  Note immune suppression with RA therapy-leflunomide, low-dose prednisone, Enbrel.  -follow blood cultures until finalized. - Consider stopping abx after today's dosing (has had 6 days broadened to vanc/cefepime so far, today would be 7).  Rheumatoid arthritis  - Continue low-dose prednisone; may need stress dose steroids if she demonstrates signs of adrenal insufficiency (avoid if able). - Con't to hold leflunomide and Enbrel.  Normal pressure hydrocephalus with hx shunt. CT head with shunt in place, slight enlargement of ventricles.  CT Abd/Pelvis with small amt free fluid in abdomen.  - No intervention needed.  Thrombocytopenia due to IABP -monitor, expect improvement after IABP removed today.  Best Practice (right click and "Reselect all SmartList Selections" daily)  Diet/type: Regular consistency (see orders) DVT prophylaxis: DOAC GI prophylaxis: N/A Lines: Central line and Arterial Line - can likely d/c today.  Have asked RN to discuss with CHF team and confirm that they are [facility] with this. Foley:  N/A Code Status:  DNR Last date of multidisciplinary  goals of care discussion: family updated 10/20 at bedside on plan of care.  CC time: 30 min.   Montey Hora, [facility] For pager details, please see AMION or use Epic chat  After 1900, please call [facility] for cross coverage needs 08/06/2021, 7:20 AM

## 2021-08-07 DIAGNOSIS — J9601 Acute respiratory failure with hypoxia: Secondary | ICD-10-CM | POA: Diagnosis not present

## 2021-08-07 DIAGNOSIS — I5031 Acute diastolic (congestive) heart failure: Secondary | ICD-10-CM | POA: Diagnosis not present

## 2021-08-07 LAB — BASIC METABOLIC PANEL
Anion gap: 8 (ref 5–15)
BUN: 53 mg/dL — ABNORMAL HIGH (ref 8–23)
CO2: 23 mmol/L (ref 22–32)
Calcium: 8.9 mg/dL (ref 8.9–10.3)
Chloride: 108 mmol/L (ref 98–111)
Creatinine, Ser: 2 mg/dL — ABNORMAL HIGH (ref 0.44–1.00)
GFR, Estimated: 25 mL/min — ABNORMAL LOW (ref >60)
Glucose, Bld: 112 mg/dL — ABNORMAL HIGH (ref 70–99)
Potassium: 3.3 mmol/L — ABNORMAL LOW (ref 3.5–5.1)
Sodium: 139 mmol/L (ref 135–145)

## 2021-08-07 LAB — CBC
HCT: 30 % — ABNORMAL LOW (ref 36.0–46.0)
Hemoglobin: 9.7 g/dL — ABNORMAL LOW (ref 12.0–15.0)
MCH: 30.8 pg (ref 26.0–34.0)
MCHC: 32.3 g/dL (ref 30.0–36.0)
MCV: 95.2 fL (ref 80.0–100.0)
Platelets: 90 10*3/uL — ABNORMAL LOW (ref 150–400)
RBC: 3.15 MIL/uL — ABNORMAL LOW (ref 3.87–5.11)
RDW: 13.6 % (ref 11.5–15.5)
WBC: 16.3 10*3/uL — ABNORMAL HIGH (ref 4.0–10.5)
nRBC: 0 % (ref 0.0–0.2)

## 2021-08-07 LAB — MAGNESIUM: Magnesium: 2.3 mg/dL (ref 1.7–2.4)

## 2021-08-07 LAB — GLUCOSE, CAPILLARY
Glucose-Capillary: 99 mg/dL (ref 70–99)
Glucose-Capillary: 129 mg/dL — ABNORMAL HIGH (ref 70–99)
Glucose-Capillary: 103 mg/dL — ABNORMAL HIGH (ref 70–99)
Glucose-Capillary: 114 mg/dL — ABNORMAL HIGH (ref 70–99)
Glucose-Capillary: 115 mg/dL — ABNORMAL HIGH (ref 70–99)
Glucose-Capillary: 110 mg/dL — ABNORMAL HIGH (ref 70–99)

## 2021-08-07 LAB — COOXEMETRY PANEL
Carboxyhemoglobin: 1 % (ref 0.5–1.5)
Methemoglobin: 0.9 % (ref 0.0–1.5)
O2 Saturation: 49.5 %
Total hemoglobin: 10.2 g/dL — ABNORMAL LOW (ref 12.0–16.0)

## 2021-08-07 MED ORDER — AMLODIPINE BESYLATE 10 MG PO TABS
10.0000 mg | ORAL_TABLET | Freq: Every day | ORAL | Status: DC
  Administered 2021-08-07 – 2021-08-09 (×3): 10 mg via ORAL
  Filled 2021-08-07 (×3): qty 1

## 2021-08-07 MED ORDER — POTASSIUM CHLORIDE CRYS ER 20 MEQ PO TBCR
40.0000 meq | EXTENDED_RELEASE_TABLET | Freq: Once | ORAL | Status: AC
  Administered 2021-08-07: 40 meq via ORAL
  Filled 2021-08-07: qty 4

## 2021-08-07 MED ORDER — CARVEDILOL 6.25 MG PO TABS
6.2500 mg | ORAL_TABLET | Freq: Two times a day (BID) | ORAL | Status: DC
  Administered 2021-08-07 – 2021-08-09 (×4): 6.25 mg via ORAL
  Filled 2021-08-07 (×4): qty 1

## 2021-08-07 MED ORDER — FUROSEMIDE 40 MG PO TABS
40.0000 mg | ORAL_TABLET | Freq: Every day | ORAL | Status: DC
  Administered 2021-08-07 – 2021-08-09 (×3): 40 mg via ORAL
  Filled 2021-08-07 (×3): qty 1

## 2021-08-07 MED ORDER — POTASSIUM CHLORIDE 20 MEQ PO PACK
20.0000 meq | PACK | Freq: Once | ORAL | Status: DC
  Filled 2021-08-07: qty 1

## 2021-08-07 MED ORDER — HYDRALAZINE HCL 50 MG PO TABS
50.0000 mg | ORAL_TABLET | Freq: Once | ORAL | Status: AC
  Administered 2021-08-07: 50 mg via ORAL
  Filled 2021-08-07: qty 1

## 2021-08-07 NOTE — TOC Progression Note (Signed)
Transition of Care [facility]) - Progression Note    Patient Details  Name: [name] MRN: [id number] Date of Birth: 07/15/1941  Transition of Care [facility]) CM/SW [place], [place] Phone Number: 08/07/2021, 1:22 PM  Clinical Narrative:    HF CSW attempted to reach out to [name] husband, [name] [phone] to verify about discharge plans and Pennybyrn and CSW had to leave a voicemail for them to return the call.  CSW will continue to follow throughout discharge.     Barriers to Discharge: Continued Medical Work up  Expected Discharge Plan and Services   In-house Referral: Clinical Social Work     Living arrangements for the past 2 months: [place]                                       Social Determinants of Health (SDOH) Interventions    Readmission Risk Interventions No flowsheet data found.  Srihaan Mastrangelo, MSW, [place] [facility] Social Worker

## 2021-08-07 NOTE — Evaluation (Signed)
Occupational Therapy Evaluation Patient Details Name: [name] MRN: [id number] DOB: 11/15/1940 Today's Date: 08/07/2021   History of Present Illness Pt is 80 yo female presented on 07/30/21 with weakness.  Pt with pleural effusion, possible PNE, elevated troponin - suspected silent MI with worsening MR, shock, afib with RVR, resp failure.  She was intubated 10/20-10/22, 10/20 TEE, IABP, Swan; 10/24 mitral clip placed; 10/25 IABP removed; 10/26 Swan removed. Pt with hx including RA, hydrocephalus s/p stent, neuropathy, vertigo, HLD, depression.   Clinical Impression   Pt ambulated with a RW and was independent in self care, with occasional assist for shoes. Pt was dependent in all IADL. Pt presents with generalized weakness, poor activity tolerance, decreased balance and impaired cognition. She needs set up to total assist for ADL and can tolerate 1-2 minutes of activity before fatiguing. Pt stood from chair x 3 with moderate assistance and stood with light mod assist due to posterior bias. Recommending SNF for ST rehab before returning home with her supportive spouse to their ILF home.     Recommendations for follow up therapy are one component of a multi-disciplinary discharge planning process, led by the attending physician.  Recommendations may be updated based on patient status, additional functional criteria and insurance authorization.   Follow Up Recommendations  Skilled nursing-short term rehab (<3 hours/day)    Assistance Recommended at Discharge Frequent or constant Supervision/Assistance  Functional Status Assessment  Patient has had a recent decline in their functional status and/or demonstrates limited ability to make significant improvements in function in a reasonable and predictable amount of time  Equipment Recommendations  None recommended by OT    Recommendations for Other Services       Precautions / Restrictions Precautions Precautions: Fall       Mobility Bed Mobility               General bed mobility comments: pt up in chair    Transfers Overall transfer level: Needs assistance Equipment used: Rolling walker (2 wheels) Transfers: Sit to/from Stand Sit to Stand: Mod assist;+2 safety/equipment           General transfer comment: assist to rise and steady, stood x 3 from chair, fatigues easily and sits without control of descent      Balance Overall balance assessment: Needs assistance Sitting-balance support: Bilateral upper extremity supported Sitting balance-Leahy Scale: Fair     Standing balance support: Bilateral upper extremity supported Standing balance-Leahy Scale: Poor Standing balance comment: mod assist and RW, posterior bias                           ADL either performed or assessed with clinical judgement   ADL Overall ADL's : Needs assistance/impaired Eating/Feeding: Set up;Sitting   Grooming: Set up;Sitting;Oral care   Upper Body Bathing: Minimal assistance;Sitting   Lower Body Bathing: Maximal assistance;Sit to/from stand   Upper Body Dressing : Minimal assistance;Sitting   Lower Body Dressing: Maximal assistance;Sit to/from stand   Toilet Transfer: Moderate assistance;Rolling walker (2 wheels)   Toileting- Clothing Manipulation and Hygiene: Total assistance;Sit to/from stand               Vision Ability to See in Adequate Light: 0 Adequate Patient Visual Report: No change from baseline       Perception     Praxis      Pertinent Vitals/Pain Pain Assessment: No/denies pain     Hand Dominance Left   Extremity/Trunk Assessment  Upper Extremity Assessment Upper Extremity Assessment: Generalized weakness;Difficult to assess due to impaired cognition   Lower Extremity Assessment Lower Extremity Assessment: Defer to PT evaluation   Cervical / Trunk Assessment Cervical / Trunk Assessment: Normal   Communication Communication Communication: HOH    Cognition Arousal/Alertness: Awake/alert Behavior During Therapy: Flat affect Overall Cognitive Status: Impaired/Different from baseline Area of Impairment: Orientation;Attention;Following commands;Awareness;Problem solving                 Orientation Level: Disoriented to;Place;Time;Situation Current Attention Level: Sustained   Following Commands: Follows one step commands inconsistently   Awareness: Intellectual Problem Solving: Slow processing;Decreased initiation;Difficulty sequencing;Requires verbal cues;Requires tactile cues General Comments: Pt answered PLOF questions correctly (confirmed with spouse); however, could not state date, upcoming holiday, president, or location; slow to respond with increased cues required     General Comments       Exercises     Shoulder Instructions      Home Living Family/patient expects to be discharged to:: Private residence Living Arrangements: Spouse/significant other Available Help at Discharge: Family;Available 24 hours/day Type of Home: Other(Comment) (ILF villa [facility]) Home Access: Level entry     Home Layout: One level     Bathroom Shower/Tub: Occupational psychologist: Handicapped height     Home Equipment: Conservation officer, nature (2 wheels);Grab bars - tub/shower;Cane - single point;Wheelchair - Water quality scientist (4 wheels);BSC          Prior Functioning/Environment Prior Level of Function : Independent/Modified Independent;Needs assist             Mobility Comments: Pt ambulated with RW in home and short community distances ADLs Comments: Spouse reports occasionally needs help with shoes; but otherwise independent ADLs, dependent in IADL.        OT Problem List: Decreased strength;Decreased activity tolerance;Impaired balance (sitting and/or standing);Decreased cognition;Decreased safety awareness;Decreased knowledge of use of DME or AE      OT Treatment/Interventions: Self-care/ADL training;DME  and/or AE instruction;Therapeutic activities;Patient/family education;Balance training    OT Goals(Current goals can be found in the care plan section) Acute Rehab OT Goals Patient Stated Goal: get stronger, go home OT Goal Formulation: With patient Time For Goal Achievement: 08/22/21 Potential to Achieve Goals: Good ADL Goals Pt Will Perform Grooming: with min assist;standing Pt Will Perform Upper Body Dressing: with set-up;sitting Pt Will Perform Lower Body Dressing: with min assist;sit to/from stand Pt Will Transfer to Toilet: with min assist;ambulating;bedside commode Pt Will Perform Toileting - Clothing Manipulation and hygiene: with min assist;sit to/from stand Pt/caregiver will Perform Home Exercise Program: Increased strength;With Supervision;Both right and left upper extremity Additional ADL Goal #1: Pt will tolerate 5 minutes of ADL without rest break.  OT Frequency: Min 2X/week   Barriers to D/C:            Co-evaluation              AM-PAC OT "6 Clicks" Daily Activity     Outcome Measure Help from another person eating meals?: A Little Help from another person taking care of personal grooming?: A Little Help from another person toileting, which includes using toliet, bedpan, or urinal?: Total Help from another person bathing (including washing, rinsing, drying)?: A Lot Help from another person to put on and taking off regular upper body clothing?: A Little Help from another person to put on and taking off regular lower body clothing?: Total 6 Click Score: 13   End of Session Equipment Utilized During Treatment: Rolling walker (2 wheels);Gait belt  Nurse Communication: Mobility status  Activity Tolerance: Patient limited by fatigue Patient left: in chair;with call bell/phone within reach;with chair alarm set;with family/visitor present  OT Visit Diagnosis: Unsteadiness on feet (R26.81);Other abnormalities of gait and mobility (R26.89);Muscle weakness  (generalized) (M62.81);Other symptoms and signs involving cognitive function                Time: 6825-7493 OT Time Calculation (min): 17 min Charges:  OT General Charges $OT Visit: 1 Visit OT Evaluation $OT Eval Moderate Complexity: 1 Mod  Nestor Lewandowsky, OTR/L Acute Rehabilitation Services Pager: [phone] Office: [phone]   Malka So 08/07/2021, 1:10 PM

## 2021-08-07 NOTE — Progress Notes (Signed)
NAME:  [name], MRN:  [id number], DOB:  December 09, 1940, LOS: 8 ADMISSION DATE:  07/30/2021, CONSULTATION DATE:  07/31/21 REFERRING MD:  Dr. Beryl Meager, CHIEF COMPLAINT:  Weakness   History of Present Illness:  80 y/o F who presented to [facility] on 10/19 with reports of weakness.   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  She reported on presentation that she had been feeling increasingly weak over the preceding four days.  She went to get a flu shot two days ago and seemed somewhat altered per family.  The weakness progressed to the point she could not sit or stand on her own which is a marked change from her baseline.  In the ER she reported shortness of breath & vague abdominal pain prior to presentation.  Initial ER evaluation notable for SBP 80-100's and new O2 need of 2-6L.  She was transitioned to BiPAP support after IVF.  Labs showed Na 133, Bicarb 18, BUN 62, Cr 4.43 (baseline 0.7), AST 112, WBC 20.4, troponin of 16, 652, lactic acid 2.62.  She was negative for COVID and influenza.  CXR showed small left pleural effusion, mild atelectasis on right & bibasilar atelectasis.  EKG showed ST changes thought to be non-specific.  She was admitted per [facility] for further work up with working diagnosis of possible PNA / infectious etiology of weakness.  She had improvement in BP and lactate with IVF + abx.  Subsequent ECHO showed an LVEF of 60-65%, LV with regional wall motion abnormalities with mid inferolateral hypokinesis, grade II diastolic dysfunction, RVSP of 63.7, LA severely dilated, RA mildly dilated, question of flail mitral valve.  ECHO findings concerning for MI with papillary muscle rupture and severe MR.  She remained on BiPAP for shortness of breath.     PCCM consulted for pulmonary evaluation.   Pertinent  Medical History  Degenerative Disease  Normal Pressure Hydrocephalus s/p Stent Neuropathy  Vertigo  HLD Depression  Former Smoker - smoked total 20 years RA  - on leflunomide, prednisone  Retired Therapist, sports   Significant Hospital Events: Including procedures, antibiotic start and stop dates in addition to other pertinent events   10/19 Admit with weakness, SOB, elevated troponin  10/20 PCCM consulted, intubated, TEE, IABP, Swan 10/21 failed SBT due to lack of respiratory drive 27/06 extubated, Afib with RVR- on amio. 10/24 mitral clip placed successfully 10/25 IABP removed, started on Eliquis 10/26 all lines removed.   Interim History / Subjective:  Somnolent yesterday, but more awake today. Still complaining of feeling non-specifically unwell.   Objective   Blood pressure (!) 123/55, pulse 83, temperature 98.7 F (37.1 C), temperature source Oral, resp. rate 17, height 5\' 7"  (1.702 m), weight 73 kg, SpO2 90 %. PAP: (33-42)/(17-26) 42/26 CVP:  [5 mmHg-8 mmHg] 7 mmHg      Intake/Output Summary (Last 24 hours) at 08/07/2021 0810 Last data filed at 08/07/2021 0600 Gross per 24 hour  Intake 296.44 ml  Output 3225 ml  Net -2928.56 ml    Filed Weights   08/06/21 0000 08/06/21 0500 08/07/21 0635  Weight: 73 kg 73 kg 73 kg    Examination: General: Chronically ill appearing woman resting comfortably.  In NAD HENT: [place]/AT, eyes anicteric, EOMI. Lungs: breathing comfortably on [place], fine crackles at both bases.  Cardiovascular:  RRR, 2/6 SEM. Abdomen: BS x 4, soft NT. Extremities: No deformities, 1+ edema. Neuro: A&O x 3, no deficits.  Assessment & Plan:   Suspected silent  recent MI with severe acute worsening of underlying MR.  She is now s/p mitral clipping 10/24.  Not a candidate for surgical intervention.  Repeat echo 10/25 after mitral clip with normal EF and improved MR. Acute HFpEF Probable NSTEMI with shock - improved with IABP and more so after mitral clipping.  IABP removed 10/25 A.flutter - paroxysmal, associated and worsened with milrinone (now off). New- onset Afib with RVR - resolved with amio, currently in  NSR Hypertension. Acute hypoxic respiratory failure - improving. AKI, improving Leukocytosis with underlying immune suppression  Rheumatoid arthritis  Normal pressure hydrocephalus with hx shunt.   Plan:  - Ready for transfer to PCU - Will likely need CIR - Incentive spirometry. - Repeat BNP - Echo today.  - Continue current antihypertensive therapy - Imdur and Hydralazine - consider starting home medications.  - Secondary prevention - Atorvastatin  - Amiodarone po and Eliquis for AFib  Best Practice (right click and "Reselect all SmartList Selections" daily)  Diet/type: Regular consistency (see orders) DVT prophylaxis: DOAC GI prophylaxis: N/A Lines: Central line and Arterial Line - can likely d/c today.  Have asked RN to discuss with CHF team and confirm that they are [facility] with this. Foley:  N/A Code Status:  DNR Last date of multidisciplinary goals of care discussion: family updated 10/20 at bedside on plan of care.  Kipp Brood, MD [facility] ICU Physician [place]  Pager: [phone] Or Epic Secure Chat After hours: [phone].  08/07/2021, 8:26 AM

## 2021-08-07 NOTE — Progress Notes (Signed)
Attempted to call report to 4E, placed on hold for 5+ minutes. Will attempt again.

## 2021-08-07 NOTE — Progress Notes (Signed)
Patient ID: Elaf Clauson, female   DOB: Mar 08, 1941, 80 y.o.   MRN: [id number]     Advanced Heart Failure Rounding Note  PCP-Cardiologist: None   Subjective:   07/31/21 Shock . IABP/swan  placed. Intubated.  10/22 Extubated.  Atrial flutter with milrinone.  10/24 S/P Mitral Clip => severe MR to moderate MR.  10/25 IABP removed. NTG drip wean off.  10/26 Swan out.   PLTs 130 -> 92 -> 77 -> 66 -> 55->59 -> 90 Creatinine 1.98>2.1> 2.1>2.0  Good diuresis yesterday.  No complaints today.    Objective:   Weight Range: 73 kg Body mass index is 25.21 kg/m.   Vital Signs:   Temp:  [97.5 F (36.4 C)-98.8 F (37.1 C)] 98.7 F (37.1 C) (10/27 0738) Pulse Rate:  [77-87] 83 (10/27 0600) Resp:  [13-25] 17 (10/27 0600) BP: (102-148)/(54-104) 123/55 (10/27 0600) SpO2:  [87 %-96 %] 90 % (10/27 0600) Arterial Line BP: (149-169)/(60-70) 169/70 (10/26 1100) Weight:  [73 kg] 73 kg (10/27 0635) Last BM Date: 08/06/21  Weight change: Filed Weights   08/06/21 0000 08/06/21 0500 08/07/21 0635  Weight: 73 kg 73 kg 73 kg    Intake/Output:   Intake/Output Summary (Last 24 hours) at 08/07/2021 0859 Last data filed at 08/07/2021 0600 Gross per 24 hour  Intake 296.44 ml  Output 3225 ml  Net -2928.56 ml      Physical Exam   General: NAD Neck: JVP 8-9 cm, no thyromegaly or thyroid nodule.  Lungs: Crackles at bases.  CV: Nondisplaced PMI.  Heart regular S1/S2, no S3/S4, no murmur.  No peripheral edema.   Abdomen: Soft, nontender, no hepatosplenomegaly, no distention.  Skin: Intact without lesions or rashes.  Neurologic: Alert and oriented x 3.  Psych: Normal affect. Extremities: No clubbing or cyanosis.  HEENT: Normal.   Telemetry   NSR 80s (personally reviewed)  Labs    CBC Recent Labs    08/06/21 0435 08/07/21 0352  WBC 14.4* 16.3*  HGB 8.9* 9.7*  HCT 27.5* 30.0*  MCV 96.8 95.2  PLT 59* 90*   Basic Metabolic Panel Recent Labs    08/06/21 0435  08/07/21 0352  NA 140 139  K 3.5 3.3*  CL 109 108  CO2 23 23  GLUCOSE 108* 112*  BUN 57* 53*  CREATININE 2.09* 2.00*  CALCIUM 8.5* 8.9  MG 1.8 2.3   Liver Function Tests No results for input(s): AST, ALT, ALKPHOS, BILITOT, PROT, ALBUMIN in the last 72 hours.  No results for input(s): LIPASE, AMYLASE in the last 72 hours. Cardiac Enzymes No results for input(s): CKTOTAL, CKMB, CKMBINDEX, TROPONINI in the last 72 hours.  BNP: BNP (last 3 results) Recent Labs    07/30/21 1945  BNP 2,001.3*    ProBNP (last 3 results) No results for input(s): PROBNP in the last 8760 hours.   D-Dimer No results for input(s): DDIMER in the last 72 hours. Hemoglobin A1C No results for input(s): HGBA1C in the last 72 hours. Fasting Lipid Panel No results for input(s): CHOL, HDL, LDLCALC, TRIG, CHOLHDL, LDLDIRECT in the last 72 hours. Thyroid Function Tests No results for input(s): TSH, T4TOTAL, T3FREE, THYROIDAB in the last 72 hours.  Invalid input(s): FREET3   Other results:   Imaging    No results found.   Medications:     Scheduled Medications:  amiodarone  200 mg Oral BID   apixaban  2.5 mg Oral BID   atorvastatin  80 mg Oral Daily   Chlorhexidine Gluconate Cloth  6 each Topical Daily   docusate sodium  100 mg Oral BID   feeding supplement  237 mL Oral TID BM   hydrALAZINE  50 mg Oral Q8H   insulin aspart  1-3 Units Subcutaneous TID AC & HS   isosorbide mononitrate  30 mg Oral Daily   metoprolol succinate  25 mg Oral Daily   multivitamin with minerals  1 tablet Oral Daily   polyethylene glycol  17 g Oral Daily   potassium chloride  20 mEq Oral Once   predniSONE  2 mg Oral Daily   sodium chloride flush  3 mL Intravenous Q12H    Infusions:    PRN Medications: acetaminophen **OR** acetaminophen, acetaminophen, albuterol, hydrALAZINE, labetalol, ondansetron (ZOFRAN) IV, traMADol    Patient Profile   80 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 PMH  h/o normal pressure hydrocephalus s/p stent, HDL, RA and former smoker.   Shock --. Cardiogenic +/- septic. Severe MR. IABP palced   Assessment/Plan   1. Mitral regurgitation: TEE showed severe, eccentric posteriorly-directed mitral regurgitation.  There was prolapse/partial flail of the A2 segment of the anterior leaflet.  There was also restriction of the posterior leaflet.  The left atrium was moderate to severely dilated, so I think that there was some degree of MR prior to the acute event.  Suspect that there was pre-existing prolapse/partial flail of A2 and patient had NSTEMI involving LCx territory, leading to lateral wall motion abnormality and restriction of the posterior leaflet with acute worsening of the mitral regurgitation. TEE does not show definite evidence for papillary muscle rupture.  Suspect that the cause of her cardiogenic shock is acute worsening of mitral regurgitation. Not surgical candidate. Hemodynamics and end-organ perfusion improved with IABP. Hydralazine added for high MAP.  Did not tolerate milrinone due to AFL.  10/24 S/P Mitraclip x 1.  IABP removed 10/25  Echo post-op  EF 60-65%, RV normal, MV repair s/p Mitraclip with moderate MR.  - Using apixaban 2.5 bid and no DAPT (discussed with structural heart team).  2. AKI: Baseline creatinine < 1.0, 3.8 -> 2.6 -> 2.1 -> 1.98 -->2.1-->2.1 -> 2.0.   Suspect this was due to cardiogenic shock/ATN with acute worsening of mitral regurgitation.  3. Acute diastolic CHF/cardiogenic shock: In setting of suspected acute on chronic mitral regurgitation and NSTEMI.  Elevated filling pressures on RHC, especially high PCWP (though surprisingly, she did not have prominent v-waves).  IABP removed 10/25. S/P Mitraclip 10/24.  - Today, will add Lasix 40 mg po daily.  4. Rheumatoid arthritis: On prednisone 2 mg daily, Enbrel, and leflunomide at home.  - She is back on low dose prednisone.  5. Acute hypoxemic respiratory failure: Pulmonary edema  due to MR.  Now extubated.  6. ID: Off abx.  7. CAD: Out of hospital NSTEMI with HS-TnI up to about 16,000.  Inferolateral wall motion abnormality suggests LCx territory MI.  Suspect that there was a component of acute infarct-related mitral regurgitation. No chest pain.  No plan for coronary angiography with completed MI and no chest pain in setting of elevated creatinine.  - No ASA with apixaban use.  - Atorvastatin 80 daily.  - Add Coreg.  8. Thrombocytopenia: Improved with IABP out.  9. AFL, paroxysmal: Associated with milrinone. - Apixaban 2.5 bid.  10. HTN: Currently on hydralazine/Imdur.  - Wean off hydral/Imdur onto home amlodipine.  - Add Coreg 6.25 mg bid.   May go to step down.   Length of  Stay: [place], MD  08/07/2021, 8:59 AM  Advanced Heart Failure Team Pager [phone] (M-F; 7a - 5p)  Please contact [facility] for night-coverage after hours (5p -7a ) and weekends on [email or web address]

## 2021-08-08 ENCOUNTER — Inpatient Hospital Stay (HOSPITAL_COMMUNITY): Payer: Medicare Other

## 2021-08-08 DIAGNOSIS — I5031 Acute diastolic (congestive) heart failure: Secondary | ICD-10-CM | POA: Diagnosis not present

## 2021-08-08 DIAGNOSIS — J9601 Acute respiratory failure with hypoxia: Secondary | ICD-10-CM | POA: Diagnosis not present

## 2021-08-08 LAB — CBC
HCT: 27.2 % — ABNORMAL LOW (ref 36.0–46.0)
Hemoglobin: 8.8 g/dL — ABNORMAL LOW (ref 12.0–15.0)
MCH: 31.2 pg (ref 26.0–34.0)
MCHC: 32.4 g/dL (ref 30.0–36.0)
MCV: 96.5 fL (ref 80.0–100.0)
Platelets: 117 10*3/uL — ABNORMAL LOW (ref 150–400)
RBC: 2.82 MIL/uL — ABNORMAL LOW (ref 3.87–5.11)
RDW: 13.5 % (ref 11.5–15.5)
WBC: 18.5 10*3/uL — ABNORMAL HIGH (ref 4.0–10.5)
nRBC: 0.1 % (ref 0.0–0.2)

## 2021-08-08 LAB — GLUCOSE, CAPILLARY
Glucose-Capillary: 102 mg/dL — ABNORMAL HIGH (ref 70–99)
Glucose-Capillary: 101 mg/dL — ABNORMAL HIGH (ref 70–99)
Glucose-Capillary: 118 mg/dL — ABNORMAL HIGH (ref 70–99)
Glucose-Capillary: 97 mg/dL (ref 70–99)

## 2021-08-08 LAB — URINALYSIS, COMPLETE (UACMP) WITH MICROSCOPIC
Bilirubin Urine: NEGATIVE
Glucose, UA: NEGATIVE mg/dL
Ketones, ur: NEGATIVE mg/dL
Leukocytes,Ua: NEGATIVE
Nitrite: NEGATIVE
Protein, ur: NEGATIVE mg/dL
Specific Gravity, Urine: 1.008 (ref 1.005–1.030)
pH: 5 (ref 5.0–8.0)

## 2021-08-08 LAB — ECHOCARDIOGRAM LIMITED
Area-P 1/2: 3.31 cm2
Calc EF: 61.1 %
Height: 67 in
MV M vel: 5.09 m/s
MV Peak grad: 103.6 mmHg
MV VTI: 1.55 cm2
Radius: 0.3 cm
S' Lateral: 2.5 cm
Single Plane A2C EF: 60 %
Single Plane A4C EF: 58.9 %
Weight: 2574.97 oz

## 2021-08-08 LAB — BASIC METABOLIC PANEL
Anion gap: 7 (ref 5–15)
BUN: 53 mg/dL — ABNORMAL HIGH (ref 8–23)
CO2: 24 mmol/L (ref 22–32)
Calcium: 8.6 mg/dL — ABNORMAL LOW (ref 8.9–10.3)
Chloride: 108 mmol/L (ref 98–111)
Creatinine, Ser: 1.89 mg/dL — ABNORMAL HIGH (ref 0.44–1.00)
GFR, Estimated: 26 mL/min — ABNORMAL LOW (ref >60)
Glucose, Bld: 109 mg/dL — ABNORMAL HIGH (ref 70–99)
Potassium: 3.3 mmol/L — ABNORMAL LOW (ref 3.5–5.1)
Sodium: 139 mmol/L (ref 135–145)

## 2021-08-08 LAB — PROCALCITONIN: Procalcitonin: 0.19 ng/mL

## 2021-08-08 MED ORDER — LEVOFLOXACIN 500 MG PO TABS
500.0000 mg | ORAL_TABLET | ORAL | Status: DC
Start: 2021-08-08 — End: 2021-08-09
  Administered 2021-08-08: 500 mg via ORAL
  Filled 2021-08-08: qty 1

## 2021-08-08 MED ORDER — POTASSIUM CHLORIDE CRYS ER 20 MEQ PO TBCR
40.0000 meq | EXTENDED_RELEASE_TABLET | Freq: Once | ORAL | Status: AC
  Administered 2021-08-08: 40 meq via ORAL
  Filled 2021-08-08: qty 2

## 2021-08-08 NOTE — Progress Notes (Signed)
Nutrition Follow-up  DOCUMENTATION CODES:   Not applicable  INTERVENTION:   Continue Multivitamin w/ minerals daily  Discontinue Ensure Enlive   Magic cup BID with meals, each supplement provides 290 kcal and 9 grams of protein  Encourage good PO intake   NUTRITION DIAGNOSIS:   Inadequate oral intake related to inability to eat as evidenced by NPO status. - Progressing, pt now on a diet  GOAL:   Patient will meet greater than or equal to 90% of their needs - Ongoing  MONITOR:   PO intake, Supplement acceptance, Weight trends, Labs  REASON FOR ASSESSMENT:   Ventilator    ASSESSMENT:   80 y/o F who presented to [facility] on 10/19 with reports of weakness.  10/22: Extubated  10/23: Full Liquid Diet 10/24: HH/CM diet 10/25: Regular diet 10/27: Transferred to floor  Pt laying in bed, pt seems more awake than previous visit. Still not very talkative.   Pt reports that her intake is getting better and eating more. Although, per EMR and nursing staff her intake is poor. Per EMR, pt intake does not exceed 25%.  Per EMR and RN, pt is refusing Ensure. RN was unclear as to why. PLDN asked pt and pt stated "I don't like them." Discussed additional ONS with pt, pt agreeable to try Magic Cup.   PLDN also discussed the option of a Cortrak placement and described the procedure to the pt. Pt stated that she does not want a Cortrak.     Medications reviewed and include: Colace, Lasix, SSI 1-3 units TID and daily, MVI, Miralax, Prednisone Labs reviewed: Potassium 3.3, BUN 53, Creatinine 1.89, 24 hr BG trends 102-115  Admission Weight: 75.8 kg Current Weight: 70.9 kg  UOP: 1250 mL & 1 unmeasured occurrence x 24 hr I/O's: -8952 mL since admit  Diet Order:   Diet Order             Diet regular Room service appropriate? Yes; Fluid consistency: Thin  Diet effective now                   EDUCATION NEEDS:   No education needs have been identified at this time  Skin:  Skin  Assessment: Reviewed RN Assessment  Last BM:  08/06/2021  Height:   Ht Readings from Last 1 Encounters:  07/31/21 5\' 7"  (1.702 m)    Weight:   Wt Readings from Last 1 Encounters:  08/08/21 70.9 kg    Ideal Body Weight:  61.4 kg  BMI:  Body mass index is 24.48 kg/m.  Estimated Nutritional Needs:   Kcal:  1600-1800  Protein:  80-95 grams  Fluid:  >/= 1.6 L    Bilaal Leib BS, PLDN Clinical Dietitian See AMiON for contact information.

## 2021-08-08 NOTE — Discharge Summary (Addendum)
Advanced Heart Failure Team  Discharge Summary   Patient ID: [name] MRN: [id number], DOB/AGE: 1941/09/17 80 y.o. 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 PMH h/o normal pressure hydrocephalus s/p stent, HDL, RA and former smoker.    Initially presented to [facility] on 10/19 w/ complaint of weakness, malaise and slightly AMS for 2 days post Flu vaccine.  In ED, she was found to be SOB requiring supp O2 and hypotensive w/ SBPs in the 80s. Became increasingly SOB and hypoxic and transitioned to BiPAP. Labs c/w shock. Bicab 18, lactic acid 2.62, SCr 4.43 (baseline <1.0), AST 112. WBC 20K. Initial trop [id number]. EKG NSR w/ poor R wave progression in anterior leads. COVID and Flu negative. CXR w/ small lt pleural effusion + bibasilar atelectasis. Head CT w/ stable rt frontal shunt catheter, unchanged from prior study. Slight progression of ventricle enlargement. No acute abnormality.   Hs trop 16,652>>16,254>>13,992>>14,937    2D Echo w/ normal LVEF 60-65%, RV normal. 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 MR is  highly eccentric and posteriorly directed. LA severely dilated. + mid inferolateral hypokinesis   Placed on empiric abx therapy w/ Rocephin + Azithromycin but infectious w/u returned negative.    Transferred to  [facility] for further w/u and management of severe MR and subsequent shock. TEE was then done, showing severe, posteriorly directed mitral regurgitation with partial flail A2 segment as well as small and restricted posterior leaflet. The left atrium was moderate to severely dilated. It was suspected there was some degree of MR  prior to the acute event.  Suspect  that there was pre-existing prolapse/partial flail of A2 and patient had NSTEMI involving LCx territory, leading to lateral wall motion abnormality and restriction of the posterior leaflet with acute worsening of the mitral regurgitation. TEE did not show definite evidence for papillary muscle. Rupture.  After discussion with family, patient was intubated and underwent placement of IABP and Swan for stabilization. RHC showed elevated filling pressures, especially high PCWP (though surprisingly, she did not have prominent v-waves).  She was placed on milrinone but did not tolerate due to development of atrial flutter. Started on amio. Hemodynamics improved w/ IABP.   Both CT surgery and structural cardiology teams were consulted. Felt too high risk for conventional surgical mitral valve repair or replacement. Decision made to undergo percutaneous mitral valve repair with MitraClip. This was performed on 10/24 and tolerated well. No complications.  Echo post-op  EF 60-65%, RV normal, MV repair s/p Mitraclip with moderate MR. She was weaned off IABP support and transferred out of ICU. PT/OT recommended SNF for rehab.     Using apixaban 2.5 bid and no DAPT (discussed with structural heart team).  Treated with Amiodarone 200mg  BID which reduced to daily at discharge>> likely plant o stop in a month or so.    The patient been seen by Dr. Aundra Dubin today and deemed ready for discharge home. All follow-up appointments have been scheduled. Discharge medications are listed below.    Discharge Weight: 154.98lb Discharge Vitals: Blood pressure 120/62, pulse 87, temperature 97.9 F (36.6 C), temperature source Oral, resp. rate 18, height 5\' 7"  (1.702 m), weight 70.3 kg, SpO2  95 %.  Labs: Lab Results  Component Value Date   WBC 17.7 (H) 08/09/2021   HGB 8.7 (L) 08/09/2021   HCT 26.9 (L) 08/09/2021   MCV 96.1 08/09/2021   PLT 163 08/09/2021    Recent Labs  Lab 08/04/21 0411 08/05/21 0526 08/09/21 0334   NA 139   < > 138  K 2.9*   < > 3.5  CL 105   < > 107  CO2 22   < > 22  BUN 49*   < > 48*  CREATININE 1.98*   < > 1.91*  CALCIUM 8.2*   < > 8.4*  PROT 5.5*  --   --   BILITOT 1.0  --   --   ALKPHOS 64  --   --   ALT 27  --   --   AST 39  --   --   GLUCOSE 141*   < > 102*   < > = values in this interval not displayed.   Lab Results  Component Value Date   TRIG 145 08/01/2021   BNP (last 3 results) Recent Labs    07/30/21 1945  BNP 2,001.3*    ProBNP (last 3 results) No results for input(s): PROBNP in the last 8760 hours.   Diagnostic Studies/Procedures   DG Chest 2 View  Result Date: 08/08/2021 CLINICAL DATA:  Pneumonia evaluation EXAM: CHEST - 2 VIEW COMPARISON:  Chest x-ray 08/03/2021 FINDINGS: Left internal jugular line and right Swan-Ganz catheter have been removed. Stable ventriculoperitoneal shunt catheter. Heart size is normal. Mediastinum appears stable. Calcified plaques in the aortic arch. Interval increasing hazy airspace opacities in the right upper lung zone. Small left pleural effusion unchanged. No pneumothorax. IMPRESSION: 1. Increasing opacities in the right upper lung zone concerning for pneumonia. 2. Medical devices as described. Electronically Signed   By: Ofilia Neas M.D.   On: 08/08/2021 10:36    Discharge Medications   Allergies as of 08/09/2021       Reactions   Methotrexate Derivatives Other (See Comments)   Dangerously decreased platelet count   Penicillins Itching   Tolerated Cephalosporin Date: 04/30/20.   Sulfa Antibiotics Itching        Medication List     STOP taking these medications    labetalol 100 MG tablet Commonly known as: NORMODYNE       TAKE these medications    acetaminophen 500 MG tablet Commonly known as: TYLENOL Take 1,000 mg by mouth 3 (three) times daily as needed for moderate pain.   acetaminophen-codeine 300-30 MG tablet Commonly known as: TYLENOL #3 Take 1 tablet by mouth every 8 (eight) hours  as needed for moderate pain.   alendronate 70 MG tablet Commonly known as: FOSAMAX Take 70 mg by mouth once a week.   amiodarone 200 MG tablet Commonly known as: PACERONE Take 1 tablet (200 mg total) by mouth daily. Start taking on: August 10, 2021   amLODipine 10 MG tablet Commonly known as: NORVASC Take 10 mg by mouth daily.   apixaban 2.5 MG Tabs tablet Commonly known as: ELIQUIS Take 1 tablet (2.5 mg total) by mouth 2 (two) times daily.   atorvastatin 80 MG tablet Commonly known as: LIPITOR Take 1 tablet (80 mg total) by mouth daily. Start taking on: August 10, 2021   carvedilol 6.25 MG tablet Commonly known as: COREG Take 1 tablet (6.25 mg total) by mouth 2 (two) times daily with a meal.   furosemide 40 MG tablet Commonly known  as: LASIX Take 1 tablet (40 mg total) by mouth daily. Start taking on: August 10, 2021   latanoprost 0.005 % ophthalmic solution Commonly known as: XALATAN Place 1 drop into both eyes at bedtime.   leflunomide 10 MG tablet Commonly known as: ARAVA Take 10 mg by mouth daily.   levofloxacin 250 MG tablet Commonly known as: LEVAQUIN Given one dose on 08/10/21 and another dose on 08/12/21   meclizine 25 MG tablet Commonly known as: ANTIVERT Take 1 tablet (25 mg total) by mouth 3 (three) times daily as needed for dizziness.   multivitamin tablet Take 1 tablet by mouth daily.   ondansetron 4 MG tablet Commonly known as: ZOFRAN Take 1 tablet (4 mg total) by mouth every 6 (six) hours. What changed:  when to take this reasons to take this   potassium chloride SA 20 MEQ tablet Commonly known as: KLOR-CON Take 1 tablet (20 mEq total) by mouth daily.   predniSONE 1 MG tablet Commonly known as: DELTASONE Take 2 mg by mouth daily.   traZODone 50 MG tablet Commonly known as: DESYREL Take 1 tablet (50 mg total) by mouth at bedtime.        Disposition   The patient will be discharged in stable condition to home. Discharge  Instructions     Diet - low sodium heart healthy   Complete by: As directed    Increase activity slowly   Complete by: As directed        Contact information for follow-up providers     Tommie Raymond, NP. Go on 09/01/2021.   Specialty: Cardiology Why: at 12pm. You will have an echocardiogram appointment prior. Contact information: [address] [place] [address] [phone]         [facility]. Go on 09/01/2021.   Why: at 10:20am. Appointment with Kathyrn Drown NP to follow echocardiogram Contact information: [place] [address] [phone]        [facility] Follow up on 08/15/2021.   Specialty: Cardiology Why: 08/15/21 with Dr Aundra Dubin at 12:20 . Entrance C at [facility]. Contact information: [address] [id number] [place] [phone]             Contact information for after-discharge care     Destination     [facility] .   Service: Skilled Nursing Contact information: [address] [place] [phone]                       Duration of Discharge Encounter: Greater than 35 minutes   Jarrett Soho  08/09/2021, 11:17 AM  Patient seen with PA, agree with the above note.   She is ready for discharge to SNF today.  Will arrange followup.   Loralie Champagne 08/09/2021 5:23 PM

## 2021-08-08 NOTE — Progress Notes (Addendum)
Patient ID: [name], female   DOB: January 16, 1941, 80 y.o.   MRN: [id number]     Advanced Heart Failure Rounding Note  PCP-Cardiologist: None   Subjective:   07/31/21 Shock . IABP/swan  placed. Intubated.  10/22 Extubated.  Atrial flutter with milrinone.  10/24 S/P Mitral Clip => severe MR to moderate MR.  10/25 IABP removed. NTG drip wean off.  10/26 Swan out.   Wt down 4 lb. BP stable and well controlled after Coreg addition.   PLTs trending back up 130 -> 92 -> 77 -> 66 -> 55->59 -> 90->117  Creatinine Improving 1.98>2.1> 2.1>2.0>1.89  K 3.3  WBC also rising, 14>>16>>18K but on prednisone. AF. Denies cough. No fever, chills or dysuria.   Feels well today. No complaints. Denies dyspnea. No CP. PT/OT has recommended SNF.    Objective:   Weight Range: 70.9 kg Body mass index is 24.48 kg/m.   Vital Signs:   Temp:  [97.7 F (36.5 C)-98.7 F (37.1 C)] 97.7 F (36.5 C) (10/28 0339) Pulse Rate:  [79-87] 79 (10/28 0339) Resp:  [15-22] 15 (10/28 0339) BP: (107-139)/(51-70) 118/51 (10/28 0339) SpO2:  [91 %-97 %] 91 % (10/28 0339) Weight:  [70.9 kg] 70.9 kg (10/28 0004) Last BM Date: 08/06/21  Weight change: Filed Weights   08/06/21 0500 08/07/21 0635 08/08/21 0004  Weight: 73 kg 73 kg 70.9 kg    Intake/Output:   Intake/Output Summary (Last 24 hours) at 08/08/2021 0730 Last data filed at 08/08/2021 0340 Gross per 24 hour  Intake 963 ml  Output 1250 ml  Net -287 ml      Physical Exam   General:  Well appearing. No respiratory difficulty HEENT: normal Neck: supple. JVD 8 cm. Carotids 2+ bilat; no bruits. No lymphadenopathy or thyromegaly appreciated. Cor: PMI nondisplaced. Regular rate & rhythm.2/6 MR murmur at apex  Lungs: clear Abdomen: soft, nontender, nondistended. No hepatosplenomegaly. No bruits or masses. Good bowel sounds. Extremities: no cyanosis, clubbing, rash, edema Neuro: alert & oriented x 3, cranial nerves grossly intact. moves all 4  extremities w/o difficulty. Affect pleasant.   Telemetry   NSR 80s (personally reviewed)  Labs    CBC Recent Labs    08/07/21 0352 08/08/21 0151  WBC 16.3* 18.5*  HGB 9.7* 8.8*  HCT 30.0* 27.2*  MCV 95.2 96.5  PLT 90* 329*   Basic Metabolic Panel Recent Labs    08/06/21 0435 08/07/21 0352 08/08/21 0151  NA 140 139 139  K 3.5 3.3* 3.3*  CL 109 108 108  CO2 23 23 24   GLUCOSE 108* 112* 109*  BUN 57* 53* 53*  CREATININE 2.09* 2.00* 1.89*  CALCIUM 8.5* 8.9 8.6*  MG 1.8 2.3  --    Liver Function Tests No results for input(s): AST, ALT, ALKPHOS, BILITOT, PROT, ALBUMIN in the last 72 hours.  No results for input(s): LIPASE, AMYLASE in the last 72 hours. Cardiac Enzymes No results for input(s): CKTOTAL, CKMB, CKMBINDEX, TROPONINI in the last 72 hours.  BNP: BNP (last 3 results) Recent Labs    07/30/21 1945  BNP 2,001.3*    ProBNP (last 3 results) No results for input(s): PROBNP in the last 8760 hours.   D-Dimer No results for input(s): DDIMER in the last 72 hours. Hemoglobin A1C No results for input(s): HGBA1C in the last 72 hours. Fasting Lipid Panel No results for input(s): CHOL, HDL, LDLCALC, TRIG, CHOLHDL, LDLDIRECT in the last 72 hours. Thyroid Function Tests No results for input(s): TSH, T4TOTAL, T3FREE, THYROIDAB  in the last 72 hours.  Invalid input(s): FREET3   Other results:   Imaging    No results found.   Medications:     Scheduled Medications:  amiodarone  200 mg Oral BID   amLODipine  10 mg Oral Daily   apixaban  2.5 mg Oral BID   atorvastatin  80 mg Oral Daily   carvedilol  6.25 mg Oral BID WC   Chlorhexidine Gluconate Cloth  6 each Topical Daily   docusate sodium  100 mg Oral BID   feeding supplement  237 mL Oral TID BM   furosemide  40 mg Oral Daily   insulin aspart  1-3 Units Subcutaneous TID AC & HS   multivitamin with minerals  1 tablet Oral Daily   polyethylene glycol  17 g Oral Daily   potassium chloride  40 mEq  Oral Once   predniSONE  2 mg Oral Daily   sodium chloride flush  3 mL Intravenous Q12H    Infusions:    PRN Medications: acetaminophen **OR** acetaminophen, acetaminophen, albuterol, hydrALAZINE, labetalol, ondansetron (ZOFRAN) IV, traMADol    Patient Profile   80 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 PMH h/o normal pressure hydrocephalus s/p stent, HDL, RA and former smoker.   Shock --. Cardiogenic +/- septic. Severe MR. IABP palced   Assessment/Plan   1. Mitral regurgitation: TEE showed severe, eccentric posteriorly-directed mitral regurgitation.  There was prolapse/partial flail of the A2 segment of the anterior leaflet.  There was also restriction of the posterior leaflet.  The left atrium was moderate to severely dilated, so I think that there was some degree of MR prior to the acute event.  Suspect that there was pre-existing prolapse/partial flail of A2 and patient had NSTEMI involving LCx territory, leading to lateral wall motion abnormality and restriction of the posterior leaflet with acute worsening of the mitral regurgitation. TEE does not show definite evidence for papillary muscle rupture.  Suspect that the cause of her cardiogenic shock is acute worsening of mitral regurgitation. Not surgical candidate. Hemodynamics and end-organ perfusion improved with IABP. Hydralazine added for high MAP.  Did not tolerate milrinone due to AFL.  10/24 S/P Mitraclip x 1.  IABP removed 10/25  Echo post-op  EF 60-65%, RV normal, MV repair s/p Mitraclip with moderate MR.  - Using apixaban 2.5 bid and no DAPT (discussed with structural heart team).  2. AKI: Baseline creatinine < 1.0, 3.8 -> 2.6 -> 2.1 -> 1.98 -->2.1-->2.1 -> 2.0->1.9.   Suspect this was due to cardiogenic shock/ATN with acute worsening of mitral regurgitation.  3. Acute diastolic CHF/cardiogenic shock: In setting of suspected acute on chronic mitral regurgitation and NSTEMI.  Elevated filling pressures on RHC,  especially high PCWP (though surprisingly, she did not have prominent v-waves).  IABP removed 10/25. S/P Mitraclip 10/24.  - Volume status ok, continue PO Lasix 40 mg daily .  4. Rheumatoid arthritis: On prednisone 2 mg daily, Enbrel, and leflunomide at home.  - She is back on low dose prednisone.  5. Acute hypoxemic respiratory failure: Pulmonary edema due to MR.  Now extubated.  6. ID: Off abx.  7. CAD: Out of hospital NSTEMI with HS-TnI up to about 16,000.  Inferolateral wall motion abnormality suggests LCx territory MI.  Suspect that there was a component of acute infarct-related mitral regurgitation. No chest pain.  No plan for coronary angiography with completed MI and no chest pain in setting of elevated creatinine.  - No ASA with  apixaban use.  - Atorvastatin 80 daily.  - Continue Coreg 6.25 mg bid  8. Thrombocytopenia: Improved with IABP out.  9. AFL, paroxysmal: Associated with milrinone. Now in NSR  - continue amiodarone 200 mg bid  - Apixaban 2.5 bid.  10. HTN: controlled on current regimen  - Continue amlodipine 10 mg daily  - Continue Coreg 6.25 mg bid.  11. Deconditioning - ambulate w/ PT/OT  - PT has recommended SNF for rehab, will place [facility] consult  Length of Stay: [address] PA-C  08/08/2021, 7:30 AM  Advanced Heart Failure Team Pager [phone] (M-F; 7a - 5p)  Please contact [facility] for night-coverage after hours (5p -7a ) and weekends on [email or web address]  Patient seen with PA, agree with the above note.    Creatinine down to 1.89 today.  WBCs higher at 18.5, no fever.  She remains in NSR.   No complaints.  No chest pain.   General: NAD Neck: No JVD, no thyromegaly or thyroid nodule.  Lungs: Clear to auscultation bilaterally with normal respiratory effort. CV: Nondisplaced PMI.  Heart regular S1/S2, no S3/S4, 2/6 HSM apex.  No peripheral edema.   Abdomen: Soft, nontender, no hepatosplenomegaly, no distention.  Skin: Intact without lesions or  rashes.  Neurologic: Alert and oriented x 3.  Psych: Normal affect. Extremities: No clubbing or cyanosis.  HEENT: Normal.   Stable hemodynamically, BP controlled. She is staying in NSR.  No change to cardiac meds.   WBCs elevated (also on prednisone but has been on chronically).  No fever.  - Send UA.  - Send CXR  If infectious workup unremarkable, anticipate d/c to SNF tomorrow.   Loralie Champagne 08/08/2021 12:54 PM

## 2021-08-08 NOTE — Progress Notes (Signed)
Physical Therapy Treatment Patient Details Name: [name] MRN: [id number] DOB: [birth year] Today's Date: 08/08/2021   History of Present Illness Pt is 80 yo female presented on 07/30/21 with weakness.  Pt with pleural effusion, possible PNE, elevated troponin - suspected silent MI with worsening MR, shock, afib with RVR, resp failure.  She was intubated 10/20-10/22, 10/20 TEE, IABP, Swan; 10/24 mitral clip placed; 10/25 IABP removed; 10/26 Swan removed. Pt with hx including RA, hydrocephalus s/p stent, neuropathy, vertigo, HLD, depression.    PT Comments    Pt supine in bed, requiring increased encouragement to get out of bed. Pt limited in safe mobility by decreased cognition, dizziness with positional change, and generalized weakness. Session shortened by arrival of transport for X-ray. Pt is requiring min Ax2 for bed mobility and modAx2 for transfers and stepping forward with RW. D/c plans remain appropriate at this time. PT will continue to follow acutely.    Recommendations for follow up therapy are one component of a multi-disciplinary discharge planning process, led by the attending physician.  Recommendations may be updated based on patient status, additional functional criteria and insurance authorization.  Follow Up Recommendations  Skilled nursing-short term rehab (<3 hours/day)     Assistance Recommended at Discharge Frequent or constant Supervision/Assistance  Equipment Recommendations  None recommended by PT       Precautions / Restrictions Precautions Precautions: Fall     Mobility  Bed Mobility Overal bed mobility: Needs Assistance Bed Mobility: Supine to Sit     Supine to sit: Min assist;+2 for safety/equipment     General bed mobility comments: pt requests HoB elevated, but reports that bed at home does not have elevated head, with cuing and use of bed rail pt able to bring LE off bed and requires min A for bringing trunk to upright and initial  posterior support for steadying due to dizziness, min A for bringing LE back into bed and scooting up towards HoB    Transfers Overall transfer level: Needs assistance Equipment used: Rolling walker (2 wheels) Transfers: Sit to/from Stand Sit to Stand: Mod assist;+2 safety/equipment           General transfer comment: modAx2 for coming to upright, pt with uncontrolled descent without LE back at bed requiring modAx2 to insure hips return to bed    Ambulation/Gait Ambulation/Gait assistance: Mod assist;+2 physical assistance Gait Distance (Feet): 1 Feet Assistive device: Rolling walker (2 wheels) Gait Pattern/deviations: Step-to pattern     General Gait Details: pt takes 2 steps away from bed with modAx2 and transport entered room to take pt to Xray, pt starts to sit back down without being close enough for safe sitting requiring modAx2 for bringing hips back to bed         Balance Overall balance assessment: Needs assistance Sitting-balance support: Bilateral upper extremity supported Sitting balance-Leahy Scale: Fair Sitting balance - Comments: requiring UE support and fatigued easily   Standing balance support: Bilateral upper extremity supported Standing balance-Leahy Scale: Poor Standing balance comment: mod assist and RW, posterior bias                            Cognition Arousal/Alertness: Awake/alert Behavior During Therapy: Flat affect Overall Cognitive Status: Impaired/Different from baseline Area of Impairment: Orientation;Attention;Following commands;Awareness;Problem solving                 Orientation Level: Disoriented to;Place;Time;Situation Current Attention Level: Sustained   Following Commands: Follows one  step commands inconsistently   Awareness: Intellectual Problem Solving: Slow processing;Decreased initiation;Difficulty sequencing;Requires verbal cues;Requires tactile cues General Comments: Pt needs reorientation from  spouse, requires increased cuing for tasks           General Comments General comments (skin integrity, edema, etc.): VSS on RA, initial c/o dizziness which quickly dissipate      Pertinent Vitals/Pain Pain Assessment: No/denies pain     PT Goals (current goals can now be found in the care plan section) Acute Rehab PT Goals Patient Stated Goal: agreeable to rehab PT Goal Formulation: With patient/family Time For Goal Achievement: 08/20/21 Potential to Achieve Goals: Good Progress towards PT goals: Progressing toward goals    Frequency    Min 2X/week      PT Plan Current plan remains appropriate       AM-PAC PT "6 Clicks" Mobility   Outcome Measure  Help needed turning from your back to your side while in a flat bed without using bedrails?: A Lot Help needed moving from lying on your back to sitting on the side of a flat bed without using bedrails?: Total Help needed moving to and from a bed to a chair (including a wheelchair)?: Total Help needed standing up from a chair using your arms (e.g., wheelchair or bedside chair)?: Total Help needed to walk in hospital room?: Total Help needed climbing 3-5 steps with a railing? : Total 6 Click Score: 7    End of Session Equipment Utilized During Treatment: Gait belt Activity Tolerance: Patient limited by fatigue Patient left: in chair;with call bell/phone within reach;with nursing/sitter in room;with family/visitor present Nurse Communication: Mobility status PT Visit Diagnosis: Unsteadiness on feet (R26.81);Muscle weakness (generalized) (M62.81)     Time: 9798-9211 PT Time Calculation (min) (ACUTE ONLY): 15 min  Charges:  $Therapeutic Activity: 8-22 mins                     Laiana Fratus B. Migdalia Dk PT, DPT Acute Rehabilitation Services Pager [phone] Office [phone]    [place] 08/08/2021, 11:55 AM

## 2021-08-08 NOTE — Care Management Important Message (Signed)
Important Message  Patient Details  Name: [name] MRN: [id number] Date of Birth: March 28, 1941   Medicare Important Message Given:  Yes     Shelda Altes 08/08/2021, 8:56 AM

## 2021-08-08 NOTE — TOC Progression Note (Addendum)
Transition of Care [facility]) - Progression Note    Patient Details  Name: [name] MRN: [id number] Date of Birth: [birth year]  Transition of Care [facility]) CM/SW [place], [place] Phone Number: 08/08/2021, 12:57 PM  Clinical Narrative:    HF CSW spoke with Cardiology who reported Mrs. Cousin may discharge over the weekend. Mrs. Lynn will return to [employer] [facility]. CSW spoke with Loree Fee at [facility] [place] to confirm that they take discharges over the weekend and she reported they do take discharges over the weekend and that Loree Fee will be working and to please use her number [phone] and she will have a room number then. Call report to [phone] for nurse when Mrs. Mcnabb is ready for DC. CSW again attempted to reach out to family including Mr. Koula Venier [phone] with no answer and left a voicemail for her son, Reily Treloar also [phone] as well to return the call when possible. COVID test has been ordered for weekend DC just needs to be collected and message sent to bedside RN. DNR on the front of the chart needing signature and notified attending MD. Insurance authorization not needed as Mrs. Dendinger has straight Medicare.  CSW spoke with Lennette Bihari, [phone] the patient's son about the discharge plan and his mother's return to [facility] [place]. Lennette Bihari and family are okay to transport Mrs. Walther at time of discharge.  TOC will continue to follow through discharge.    Barriers to Discharge: Continued Medical Work up  Expected Discharge Plan and Services   In-house Referral: Clinical Social Work     Living arrangements for the past 2 months: [place]                                       Social Determinants of Health (SDOH) Interventions    Readmission Risk Interventions No flowsheet data found.  Gaberial Cada, MSW, [place] Heart Failure Social Worker

## 2021-08-09 DIAGNOSIS — J9601 Acute respiratory failure with hypoxia: Secondary | ICD-10-CM | POA: Diagnosis not present

## 2021-08-09 LAB — CBC
HCT: 26.9 % — ABNORMAL LOW (ref 36.0–46.0)
Hemoglobin: 8.7 g/dL — ABNORMAL LOW (ref 12.0–15.0)
MCH: 31.1 pg (ref 26.0–34.0)
MCHC: 32.3 g/dL (ref 30.0–36.0)
MCV: 96.1 fL (ref 80.0–100.0)
Platelets: 163 10*3/uL (ref 150–400)
RBC: 2.8 MIL/uL — ABNORMAL LOW (ref 3.87–5.11)
RDW: 13.4 % (ref 11.5–15.5)
WBC: 17.7 10*3/uL — ABNORMAL HIGH (ref 4.0–10.5)
nRBC: 0 % (ref 0.0–0.2)

## 2021-08-09 LAB — SARS CORONAVIRUS 2 (TAT 6-24 HRS): SARS Coronavirus 2: NEGATIVE

## 2021-08-09 LAB — BASIC METABOLIC PANEL
Anion gap: 9 (ref 5–15)
BUN: 48 mg/dL — ABNORMAL HIGH (ref 8–23)
CO2: 22 mmol/L (ref 22–32)
Calcium: 8.4 mg/dL — ABNORMAL LOW (ref 8.9–10.3)
Chloride: 107 mmol/L (ref 98–111)
Creatinine, Ser: 1.91 mg/dL — ABNORMAL HIGH (ref 0.44–1.00)
GFR, Estimated: 26 mL/min — ABNORMAL LOW (ref >60)
Glucose, Bld: 102 mg/dL — ABNORMAL HIGH (ref 70–99)
Potassium: 3.5 mmol/L (ref 3.5–5.1)
Sodium: 138 mmol/L (ref 135–145)

## 2021-08-09 LAB — GLUCOSE, CAPILLARY: Glucose-Capillary: 104 mg/dL — ABNORMAL HIGH (ref 70–99)

## 2021-08-09 MED ORDER — AMIODARONE HCL 200 MG PO TABS: 200.0000 mg | ORAL_TABLET | Freq: Every day | ORAL | 6 refills | Status: DC

## 2021-08-09 MED ORDER — LEVOFLOXACIN 250 MG PO TABS: ORAL_TABLET | ORAL | Status: DC

## 2021-08-09 MED ORDER — AMIODARONE HCL 200 MG PO TABS: 200.0000 mg | ORAL_TABLET | Freq: Every day | ORAL | Status: DC

## 2021-08-09 MED ORDER — ATORVASTATIN CALCIUM 80 MG PO TABS: 80.0000 mg | ORAL_TABLET | Freq: Every day | ORAL | 6 refills | Status: AC

## 2021-08-09 MED ORDER — FUROSEMIDE 40 MG PO TABS: 40.0000 mg | ORAL_TABLET | Freq: Every day | ORAL | 6 refills | Status: DC

## 2021-08-09 MED ORDER — CARVEDILOL 6.25 MG PO TABS: 6.2500 mg | ORAL_TABLET | Freq: Two times a day (BID) | ORAL | 6 refills | Status: DC

## 2021-08-09 MED ORDER — POTASSIUM CHLORIDE CRYS ER 20 MEQ PO TBCR: 20.0000 meq | EXTENDED_RELEASE_TABLET | Freq: Every day | ORAL | 6 refills | Status: DC

## 2021-08-09 MED ORDER — APIXABAN 2.5 MG PO TABS: 2.5000 mg | ORAL_TABLET | Freq: Two times a day (BID) | ORAL | 11 refills | Status: AC

## 2021-08-09 NOTE — Plan of Care (Signed)
  Problem: Education: Goal: Knowledge of General Education information will improve Description: Including pain rating scale, medication(s)/side effects and non-pharmacologic comfort measures Outcome: Adequate for Discharge   Problem: Health Behavior/Discharge Planning: Goal: Ability to manage health-related needs will improve Outcome: Adequate for Discharge   Problem: Clinical Measurements: Goal: Ability to maintain clinical measurements within normal limits will improve Outcome: Adequate for Discharge Goal: Will remain free from infection Outcome: Adequate for Discharge Goal: Diagnostic test results will improve Outcome: Adequate for Discharge Goal: Respiratory complications will improve Outcome: Adequate for Discharge Goal: Cardiovascular complication will be avoided Outcome: Adequate for Discharge   Problem: Activity: Goal: Risk for activity intolerance will decrease Outcome: Adequate for Discharge   Problem: Nutrition: Goal: Adequate nutrition will be maintained Outcome: Adequate for Discharge   Problem: Coping: Goal: Level of anxiety will decrease Outcome: Adequate for Discharge   Problem: Elimination: Goal: Will not experience complications related to bowel motility Outcome: Adequate for Discharge Goal: Will not experience complications related to urinary retention Outcome: Adequate for Discharge   Problem: Pain Managment: Goal: General experience of comfort will improve Outcome: Adequate for Discharge   Problem: Safety: Goal: Ability to remain free from injury will improve Outcome: Adequate for Discharge   Problem: Skin Integrity: Goal: Risk for impaired skin integrity will decrease Outcome: Adequate for Discharge   Problem: Activity: Goal: Ability to return to baseline activity level will improve Outcome: Adequate for Discharge   Problem: Cardiovascular: Goal: Ability to achieve and maintain adequate cardiovascular perfusion will improve Outcome:  Adequate for Discharge   Problem: Inadequate Intake (NI-2.1) Goal: Food and/or nutrient delivery Description: Individualized approach for food/nutrient provision. Outcome: Adequate for Discharge   Problem: Acute Rehab PT Goals(only PT should resolve) Goal: Pt Will Go Supine/Side To Sit Outcome: Adequate for Discharge Goal: Pt Will Go Sit To Supine/Side Outcome: Adequate for Discharge Goal: Patient Will Transfer Sit To/From Stand Outcome: Adequate for Discharge Goal: Pt Will Transfer Bed To Chair/Chair To Bed Outcome: Adequate for Discharge Goal: Pt Will Ambulate Outcome: Adequate for Discharge Goal: PT Additional Goal #1 Outcome: Adequate for Discharge   Problem: Acute Rehab OT Goals (only OT should resolve) Goal: Pt. Will Perform Grooming Outcome: Adequate for Discharge Goal: Pt. Will Perform Upper Body Dressing Outcome: Adequate for Discharge Goal: Pt. Will Perform Lower Body Dressing Outcome: Adequate for Discharge Goal: Pt. Will Transfer To Toilet Outcome: Adequate for Discharge Goal: Pt. Will Perform Toileting-Clothing Manipulation Outcome: Adequate for Discharge Goal: Pt/Caregiver Will Perform Home Exercise Program Outcome: Adequate for Discharge Goal: OT Additional ADL Goal #1 Outcome: Adequate for Discharge

## 2021-08-09 NOTE — Progress Notes (Addendum)
Patient ID: [name], female   DOB: 10-06-1941, 80 y.o.   MRN: [id number]     Advanced Heart Failure Rounding Note  PCP-Cardiologist: None   Subjective:   07/31/21 Shock . IABP/swan  placed. Intubated.  10/22 Extubated.  Atrial flutter with milrinone.  10/24 S/P Mitral Clip => severe MR to moderate MR.  10/25 IABP removed. NTG drip wean off.  10/26 Swan out.   No complaints today, denies dyspnea.  Afebrile but WBCs had been rising.  WBCs lower today.  She was started on renally dosed levofloxacin for PNA.     Objective:   Weight Range: 70.3 kg Body mass index is 24.27 kg/m.   Vital Signs:   Temp:  [97.9 F (36.6 C)-98.7 F (37.1 C)] 97.9 F (36.6 C) (10/29 0903) Pulse Rate:  [77-90] 87 (10/29 0903) Resp:  [18] 18 (10/29 0354) BP: (113-136)/(61-75) 120/62 (10/29 0903) SpO2:  [91 %-95 %] 95 % (10/29 0903) Weight:  [70.3 kg] 70.3 kg (10/29 0354) Last BM Date: 08/07/21  Weight change: Filed Weights   08/07/21 0635 08/08/21 0004 08/09/21 0354  Weight: 73 kg 70.9 kg 70.3 kg    Intake/Output:   Intake/Output Summary (Last 24 hours) at 08/09/2021 1016 Last data filed at 08/09/2021 0942 Gross per 24 hour  Intake 896 ml  Output 1350 ml  Net -454 ml      Physical Exam   General: NAD Neck: No JVD, no thyromegaly or thyroid nodule.  Lungs: Mild crackles at bases.  CV: Nondisplaced PMI.  Heart regular S1/S2, no S3/S4, 1/6 HSM apex.  No peripheral edema.   Abdomen: Soft, nontender, no hepatosplenomegaly, no distention.  Skin: Intact without lesions or rashes.  Neurologic: Alert and oriented x 3.  Psych: Normal affect. Extremities: No clubbing or cyanosis.  HEENT: Normal.    Telemetry   NSR 80s (personally reviewed)  Labs    CBC Recent Labs    08/08/21 0151 08/09/21 0334  WBC 18.5* 17.7*  HGB 8.8* 8.7*  HCT 27.2* 26.9*  MCV 96.5 96.1  PLT 117* 678   Basic Metabolic Panel Recent Labs    08/07/21 0352 08/08/21 0151 08/09/21 0334  NA  139 139 138  K 3.3* 3.3* 3.5  CL 108 108 107  CO2 23 24 22   GLUCOSE 112* 109* 102*  BUN 53* 53* 48*  CREATININE 2.00* 1.89* 1.91*  CALCIUM 8.9 8.6* 8.4*  MG 2.3  --   --    Liver Function Tests No results for input(s): AST, ALT, ALKPHOS, BILITOT, PROT, ALBUMIN in the last 72 hours.  No results for input(s): LIPASE, AMYLASE in the last 72 hours. Cardiac Enzymes No results for input(s): CKTOTAL, CKMB, CKMBINDEX, TROPONINI in the last 72 hours.  BNP: BNP (last 3 results) Recent Labs    07/30/21 1945  BNP 2,001.3*    ProBNP (last 3 results) No results for input(s): PROBNP in the last 8760 hours.   D-Dimer No results for input(s): DDIMER in the last 72 hours. Hemoglobin A1C No results for input(s): HGBA1C in the last 72 hours. Fasting Lipid Panel No results for input(s): CHOL, HDL, LDLCALC, TRIG, CHOLHDL, LDLDIRECT in the last 72 hours. Thyroid Function Tests No results for input(s): TSH, T4TOTAL, T3FREE, THYROIDAB in the last 72 hours.  Invalid input(s): FREET3   Other results:   Imaging    No results found.   Medications:     Scheduled Medications:  amiodarone  200 mg Oral BID   amLODipine  10 mg Oral  Daily   apixaban  2.5 mg Oral BID   atorvastatin  80 mg Oral Daily   carvedilol  6.25 mg Oral BID WC   Chlorhexidine Gluconate Cloth  6 each Topical Daily   docusate sodium  100 mg Oral BID   furosemide  40 mg Oral Daily   insulin aspart  1-3 Units Subcutaneous TID AC & HS   levofloxacin  500 mg Oral Q48H   multivitamin with minerals  1 tablet Oral Daily   polyethylene glycol  17 g Oral Daily   predniSONE  2 mg Oral Daily   sodium chloride flush  3 mL Intravenous Q12H    Infusions:    PRN Medications: acetaminophen **OR** acetaminophen, acetaminophen, albuterol, hydrALAZINE, labetalol, ondansetron (ZOFRAN) IV, traMADol    Patient Profile   80 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 PMH h/o normal pressure hydrocephalus s/p  stent, HDL, RA and former smoker.   Shock --. Cardiogenic +/- septic. Severe MR. IABP palced   Assessment/Plan   1. Mitral regurgitation: TEE showed severe, eccentric posteriorly-directed mitral regurgitation.  There was prolapse/partial flail of the A2 segment of the anterior leaflet.  There was also restriction of the posterior leaflet.  The left atrium was moderate to severely dilated, so I think that there was some degree of MR prior to the acute event.  Suspect that there was pre-existing prolapse/partial flail of A2 and patient had NSTEMI involving LCx territory, leading to lateral wall motion abnormality and restriction of the posterior leaflet with acute worsening of the mitral regurgitation. TEE does not show definite evidence for papillary muscle rupture.  Suspect that the cause of her cardiogenic shock is acute worsening of mitral regurgitation. Not surgical candidate. Hemodynamics and end-organ perfusion improved with IABP. Hydralazine added for high MAP.  Did not tolerate milrinone due to AFL. 10/24 S/P Mitraclip x 1.  IABP removed 10/25  Echo post-op with EF 60-65%, RV normal, MV repair s/p Mitraclip with moderate MR (improved).  - Using apixaban 2.5 bid and no DAPT (discussed with structural heart team).  2. AKI: Baseline creatinine < 1.0, 3.8 -> 2.6 -> 2.1 -> 1.98 -->2.1-->2.1 -> 2.0->1.9->1.91.   Suspect AKI was due to cardiogenic shock/ATN with acute worsening of mitral regurgitation, now creatinine stable at likely new baseline.   3. Acute diastolic CHF/cardiogenic shock: In setting of suspected acute on chronic mitral regurgitation and NSTEMI.  Elevated filling pressures on RHC, especially high PCWP (though surprisingly, she did not have prominent v-waves).  IABP removed 10/25. S/P Mitraclip 10/24. Volume status ok today.  - Continue Lasix 40 mg po daily.  4. Rheumatoid arthritis: On prednisone 2 mg daily, Enbrel, and leflunomide at home.  - She is back on low dose prednisone.  5.  Acute hypoxemic respiratory failure: Pulmonary edema due to MR.  Now extubated.  6. ID: WBCs elevated, afebrile.  She had CXR with possible RUL PNA, UA negative.  She is now on levofloxacin 500 mg qod.  - Continue levofloxacin, stop date in place.  7. CAD: Out of hospital NSTEMI with HS-TnI up to about 16,000.  Inferolateral wall motion abnormality suggests LCx territory MI.  Suspect that there was a component of acute infarct-related mitral regurgitation. No chest pain.  No plan for coronary angiography with completed MI and no chest pain in setting of elevated creatinine.  - No ASA with apixaban use.  - Atorvastatin 80 daily.  - Continue Coreg 6.25 mg bid  8. Thrombocytopenia: Resolved with IABP  out.  9. AFL, paroxysmal: Associated with milrinone. Now in NSR  - continue amiodarone 200 mg bid, decrease to daily at discharge.  Will likely stop after a month or so.  - Apixaban 2.5 bid.  10. HTN: controlled on current regimen  - Continue amlodipine 10 mg daily  - Continue Coreg 6.25 mg bid.  11. Deconditioning - ambulate w/ PT/OT  - PT has recommended SNF for rehab.  12. Disposition: Thinks she can go to SNF today.  Followup in HF clinic, appt should be scheduled.  Followup structural heart clinic.  Meds for discharge: Amiodarone 200 mg daily, apixaban 2.5 bid, amlodipine 10 daily, Coreg 6.25 bid, atorvastatin 80 daily, KCl 20 daily, Lasix 40 daily, levofloxacin 500 mg every other day (see stop date in order).   Length of Stay: [place], MD  08/09/2021, 10:16 AM  Advanced Heart Failure Team Pager [phone] (M-F; 7a - 5p)  Please contact [facility] for night-coverage after hours (5p -7a ) and weekends on [email or web address]

## 2021-08-09 NOTE — Progress Notes (Signed)
Patient discharged: [facility] with family.  Left via: Wheelchair  Discharge paperwork reviewed and given: to patient and family. Teach back completed. IV and telemetry disconnected. Belongings given to patient.

## 2021-08-09 NOTE — TOC Transition Note (Signed)
Transition of Care [facility]) - CM/SW Discharge Note   Patient Details  Name: [name] MRN: [id number] Date of Birth: 1941/06/15  [facility] CM/SW Contact:  Benard Halsted, LCSW Phone Number: 08/09/2021, 11:29 AM   Clinical Narrative:    Patient will DC to: [facility] Anticipated DC date: 08/09/21 Family notified: Miguel Dibble Transport by: Son and Spouse by car   Per MD patient ready for DC to [facility] SNF. RN to call report prior to discharge [phone] Room 3016). RN, patient, patient's family, and facility notified of DC. Discharge Summary and FL2 sent to facility. DC packet on chart. Ambulance transport requested for patient.   CSW will sign off for now as social work intervention is no longer needed. Please consult [place] again if new needs arise.     Final next level of care: Skilled Nursing Facility Barriers to Discharge: Barriers Resolved   Patient Goals and CMS Choice Patient states their goals for this hospitalization and ongoing recovery are:: Return to SNF CMS Medicare.gov Compare Post Acute Care list provided to:: Patient Represenative (must comment) Choice offered to / list presented to : Adult Children, Spouse  Discharge Placement   Existing PASRR number confirmed : 08/09/21          Patient chooses bed at: [facility] Patient to be transferred to facility by: Car Name of family member notified: Miguel Dibble Patient and family notified of of transfer: 08/09/21  Discharge Plan and Services In-house Referral: Clinical Social Work   Post Acute Care Choice: [place]                               Social Determinants of Health (SDOH) Interventions     Readmission Risk Interventions No flowsheet data found.

## 2021-08-09 NOTE — Progress Notes (Signed)
Report given to [facility] at [facility]. All questions answered.

## 2021-08-15 ENCOUNTER — Ambulatory Visit (HOSPITAL_COMMUNITY)
Admission: RE | Admit: 2021-08-15 | Discharge: 2021-08-15 | Disposition: A | Discharge status: Home / Self Care | Payer: Medicare Other | Source: Ambulatory Visit | Attending: Internal Medicine | Admitting: Internal Medicine

## 2021-08-15 ENCOUNTER — Encounter (HOSPITAL_COMMUNITY): Payer: Self-pay | Admitting: Cardiology

## 2021-08-15 ENCOUNTER — Other Ambulatory Visit: Payer: Self-pay

## 2021-08-15 VITALS — BP 124/60 | HR 73

## 2021-08-15 DIAGNOSIS — F4321 Adjustment disorder with depressed mood: Secondary | ICD-10-CM

## 2021-08-15 DIAGNOSIS — Z8249 Family history of ischemic heart disease and other diseases of the circulatory system: Secondary | ICD-10-CM | POA: Insufficient documentation

## 2021-08-15 DIAGNOSIS — I340 Nonrheumatic mitral (valve) insufficiency: Secondary | ICD-10-CM

## 2021-08-15 DIAGNOSIS — I2510 Atherosclerotic heart disease of native coronary artery without angina pectoris: Secondary | ICD-10-CM | POA: Insufficient documentation

## 2021-08-15 DIAGNOSIS — N184 Chronic kidney disease, stage 4 (severe): Secondary | ICD-10-CM | POA: Insufficient documentation

## 2021-08-15 DIAGNOSIS — Z87891 Personal history of nicotine dependence: Secondary | ICD-10-CM | POA: Insufficient documentation

## 2021-08-15 DIAGNOSIS — I509 Heart failure, unspecified: Secondary | ICD-10-CM | POA: Diagnosis not present

## 2021-08-15 DIAGNOSIS — Z79899 Other long term (current) drug therapy: Secondary | ICD-10-CM | POA: Diagnosis not present

## 2021-08-15 DIAGNOSIS — Z7901 Long term (current) use of anticoagulants: Secondary | ICD-10-CM | POA: Diagnosis not present

## 2021-08-15 DIAGNOSIS — F32A Depression, unspecified: Secondary | ICD-10-CM | POA: Diagnosis not present

## 2021-08-15 DIAGNOSIS — I480 Paroxysmal atrial fibrillation: Secondary | ICD-10-CM | POA: Insufficient documentation

## 2021-08-15 DIAGNOSIS — I130 Hypertensive heart and chronic kidney disease with heart failure and stage 1 through stage 4 chronic kidney disease, or unspecified chronic kidney disease: Secondary | ICD-10-CM | POA: Insufficient documentation

## 2021-08-15 DIAGNOSIS — M069 Rheumatoid arthritis, unspecified: Secondary | ICD-10-CM | POA: Diagnosis not present

## 2021-08-15 MED ORDER — SERTRALINE HCL 50 MG PO TABS: ORAL_TABLET | ORAL | 11 refills | Status: DC

## 2021-08-15 NOTE — Patient Instructions (Addendum)
RedsClip done today.  EKG done today.  Labs done today. We will contact you only if your labs are abnormal.  STOP taking Amiodarone  START Zoloft 25mg  (1/2 tablet) by mouth daily for 1 week THEN INCREASE to 50mg  (1 tablet) by mouth daily.   No other medication changes were made. Please continue all current medications as prescribed.  Your physician recommends that you schedule a follow-up appointment in: 6-8 weeks with Dr. Aundra Dubin  If you have any questions or concerns before your next appointment please send [place] a message through [facility] or call our office at [phone].    TO LEAVE A MESSAGE FOR THE NURSE SELECT OPTION 2, PLEASE LEAVE A MESSAGE INCLUDING: YOUR NAME DATE OF BIRTH CALL BACK NUMBER REASON FOR CALL**this is important as we prioritize the call backs  YOU WILL RECEIVE A CALL BACK THE SAME DAY AS LONG AS YOU CALL BEFORE 4:00 PM   Do the following things EVERYDAY: Weigh yourself in the morning before breakfast. Write it down and keep it in a log. Take your medicines as prescribed Eat low salt foods--Limit salt (sodium) to 2000 mg per day.  Stay as active as you can everyday Limit all fluids for the day to less than 2 liters   At the [facility], you and your health needs are our priority. As part of our continuing mission to provide you with exceptional heart care, we have created designated Provider Care Teams. These Care Teams include your primary Cardiologist (physician) and Advanced Practice Providers (APPs- Physician Assistants and Nurse Practitioners) who all work together to provide you with the care you need, when you need it.   You may see any of the following providers on your designated Care Team at your next follow up: Dr Glori Bickers Dr Haynes Kerns, NP Lyda Jester, [place] Audry Riles, PharmD   Please be sure to bring in all your medications bottles to every appointment.

## 2021-08-15 NOTE — Progress Notes (Signed)
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 PMH h/o normal pressure hydrocephalus s/p stent, HDL, RA and former smoker.   Admitted 07/30/21 with increased shortness breath and hypotension. HS Trop > 16000 and felt to be from out of hospital NSTEMI. Due to respiratory compromised required intubation. Echo showed severe MR, inferolateral wall motion abnormality suggests LCx territory MI.  Suspect that there was a component of acute infarct-related mitral regurgitation. Take to cath lab and had IABP placed.  TEE with severe MR. Structural Heart Team evaluated and once optimized had salvage mitral clip on 08/04/21. Also had A flutter . Placed on amiodarone with resolution. Discharged on  amio 200 mg daily at discharge. Discharged on 08/08/21 to [facility].    Today she returns for post HF follow up with her husband and son. Complaining of fatigue. Able to walk a couple of steps. Unable to stand for weights.  Denies SOB/PND/Orthopnea. Appetite poor  No fever or chills. Weight at home  pounds. Taking all medications. At [facility] SNF.   Cardiac Testing  Echo -LVEF 60-65%, RV normal. 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 MR is  highly eccentric and posteriorly directed. LA severely dilated. + mid inferolateral hypokinesis  RHC 07/31/21 with IABP insertion  RA mean 11 RV 36/12 PA 40/20, mean 35 PCWP mean 27 Oxygen saturations: PA 78% AO 100% Cardiac Output (Fick) 7.72  Cardiac Index (Fick) 4.08 Cardiac Output (Thermo) 5.32 Cardiac Index (Thermo) 2.81 PVR 1.5 WU  ROS: All systems negative except as listed in HPI, PMH and Problem List.  SH:  Social History   Socioeconomic History   Marital status: Married    Spouse name: Not on file   Number of children: 2   Years of education: 16   Highest education level: Not on file  Occupational History   Occupation: Retired     Fish farm manager: RETIRED  Tobacco Use   Smoking status: Former   Smokeless tobacco: Never  Scientific laboratory technician Use: Never used  Substance and Sexual Activity   Alcohol use: Not Currently   Drug use: No   Sexual activity: Not on file  Other Topics Concern   Not on file  Social History Narrative   Patient lives at home with her husband.    Patient has 2 children.    Patient has 12 + years of school.    Patient is left handed.   Patient drinks about 2 cups of caffeine daily.   Social Determinants of Health   Financial Resource Strain: Not on file  Food Insecurity: Not on file  Transportation Needs: Not on file  Physical Activity: Not on file  Stress: Not on file  Social Connections: Not on file  Intimate Partner Violence: Not on file    FH:  Family History  Problem Relation Age of Onset   Ovarian cancer Mother    Heart disease Father    Brain cancer Sister     Past Medical History:  Diagnosis Date   Cervical spondylosis    Cervicogenic headache    Right occipital neuralgia   Depression    Gait disorder    Hyperlipidemia    Hypertension    Normal pressure hydrocephalus (HCC)    Occipital neuralgia 04/17/2013   right   Osteomyelitis of left knee region [facility])    Rheumatoid arthritis(714.0)  Vertigo 02/19/2015    Current Outpatient Medications  Medication Sig Dispense Refill   acetaminophen (TYLENOL) 500 MG tablet Take 1,000 mg by mouth 3 (three) times daily as needed for moderate pain.     acetaminophen-codeine (TYLENOL #3) 300-30 MG tablet Take 1 tablet by mouth every 8 (eight) hours as needed for moderate pain.     alendronate (FOSAMAX) 70 MG tablet Take 70 mg by mouth once a week.     amiodarone (PACERONE) 200 MG tablet Take 1 tablet (200 mg total) by mouth daily. 30 tablet 6   amLODipine (NORVASC) 10 MG tablet Take 10 mg by mouth daily.     apixaban (ELIQUIS) 2.5 MG TABS tablet Take 1 tablet (2.5 mg total) by mouth 2 (two) times daily. 60 tablet 11    atorvastatin (LIPITOR) 80 MG tablet Take 1 tablet (80 mg total) by mouth daily. 30 tablet 6   carvedilol (COREG) 6.25 MG tablet Take 1 tablet (6.25 mg total) by mouth 2 (two) times daily with a meal. 60 tablet 6   furosemide (LASIX) 40 MG tablet Take 1 tablet (40 mg total) by mouth daily. 30 tablet 6   latanoprost (XALATAN) 0.005 % ophthalmic solution Place 1 drop into both eyes at bedtime.      leflunomide (ARAVA) 10 MG tablet Take 10 mg by mouth daily.     levofloxacin (LEVAQUIN) 250 MG tablet Given one dose on 08/10/21 and another dose on 08/12/21 2 tablet    meclizine (ANTIVERT) 25 MG tablet Take 1 tablet (25 mg total) by mouth 3 (three) times daily as needed for dizziness. 12 tablet 0   Multiple Vitamin (MULTIVITAMIN) tablet Take 1 tablet by mouth daily.     ondansetron (ZOFRAN) 4 MG tablet Take 1 tablet (4 mg total) by mouth every 6 (six) hours. 12 tablet 0   potassium chloride SA (KLOR-CON) 20 MEQ tablet Take 1 tablet (20 mEq total) by mouth daily. 30 tablet 6   predniSONE (DELTASONE) 1 MG tablet Take 2 mg by mouth daily.     traZODone (DESYREL) 50 MG tablet Take 1 tablet (50 mg total) by mouth at bedtime. 90 tablet 3   No current facility-administered medications for this encounter.    Vitals:   08/15/21 1234  BP: 124/60  Pulse: 73  SpO2: 93%    PHYSICAL EXAM: General:  Arrived in a wheel chair.  No resp difficulty HEENT: normal Neck: supple. JVP 7-8t. Carotids 2+ bilaterally; no bruits. No lymphadenopathy or thryomegaly appreciated. Cor: PMI normal. Regular rate & rhythm. No rubs, gallops or murmurs. Lungs: clear Abdomen: soft, nontender, nondistended. No hepatosplenomegaly. No bruits or masses. Good bowel sounds. Extremities: no cyanosis, clubbing, rash, edema Neuro: alert & orientedx3, cranial nerves grossly intact. Moves all 4 extremities w/o difficulty. Affect pleasant.   ECG: SR 73 bpm personally reviewed.    ASSESSMENT & PLAN: MR, severe - likely ischemic in  nature  - S/P Mitral Clip 08/04/21  - Has follow up with Structural Team later this month with an ECHO  2. CKD Stage IV - Creatinine baseline 1.9-2.1  - Check BMET today   3. PAF  - EKG - SR  - Stop On amio 200 mg daily - On eliquis 2.5 mg twice a day  - Check BMET   4. Depression -Started on zoloft with increase in 2 weeks   5. CAD  - probable inferolateral MI  - no cath with AKI/CKD - echo 10/22 EF 55-60%  - currently no anginal symptoms  -  no ASA with apixaban use - continue atorva 80   Follow up in 8 weeks with Dr Precious Bard, NP-C [facility]  Patient seen and examined with the above-signed Advanced Practice Provider and/or Housestaff. I personally reviewed laboratory data, imaging studies and relevant notes. I independently examined the patient and formulated the important aspects of the plan. I have edited the note to reflect any of my changes or salient points. I have personally discussed the plan with the patient and/or family.  She presents for post-hospital f/u after admission for cardiogenic shock in setting of probable inferolateral MI with acute MR due to flail leaflet c/b cardiogenic shock, AKI and PAF. She was not surgical candidate and underwent IABP for hemodynamic support followed by MitraClip with excellent result.   She is here today with her husband and son (who is ER physician). Remains at [facility] SN[facility]. Denies CP or SOB. No orthopnea or PND. Remains in NSR.   General:  Elderly woman in [facility]. No resp difficulty HEENT: normal Neck: supple. no JVD. Carotids 2+ bilat; no bruits. No lymphadenopathy or thryomegaly appreciated. Cor: PMI nondisplaced. Regular rate & rhythm. No rubs, gallops or murmurs. Lungs: clear Abdomen: soft, nontender, nondistended. No hepatosplenomegaly. No bruits or masses. Good bowel sounds. Extremities: no cyanosis, clubbing, rash, edema Neuro: alert & orientedx3, cranial nerves grossly intact. moves all 4 extremities w/o  difficulty. Affect very flat  Doing very well. Has had excellent response to MitraClip. Volume status looks good. Remains in NSR. Son would like to stop amio if possible given transient nature of AF - I think this is fine. Continue Eliquis, Will start Zoloft for severe depression. Continue rehab. F/u Structural Team as well.   Glori Bickers, MD  6:57 PM

## 2021-08-26 NOTE — Progress Notes (Signed)
HEART AND [place]                                     Cardiology Office Note:    Date:  09/01/2021   ID:  [name], DOB [birth year], MRN [id number]  PCP:  Margaret Glazier, MD  [facility] Cardiologist:  Dr. Aundra Dubin, MD/ Dr. Ali Lowe, MD (TAVR)   Referring MD: Margaret Glazier, MD   Chief Complaint  Patient presents with   Follow-up    1 month s/p MitraClip    History of Present Illness:    [name] is a 80 y.o. 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   Ms. [name] lives at home with her husband. She has chronic physical debilitation from longstanding rheumatoid arthritis.  She walks very slowly with a walker. She is occasionally able to get out of the house to go to church or the store. She has strong family support with her 2 sons who both live locally. One of her sons, is Dr. Lajean [name] who works for our ER department. They had been recently discussing getting nursing care help in the home due to her inability to take care of herself and her husband. She has some mild dementia.     Her family has noticed a gradual slowing down of their mother over the past several months prior to her hospitalization. She was in her usual state of health until approximately 4 days prior to  admission when she developed progressive weakness and altered mental status. She was brought to [facility] ED on 07/30/21 for evaluation.  In the ER she has acute SOB & vague abdominal pain prior to presentation. She required high flow O2 and was ultimately transitioned to BiPAP support after IVFs. Labs showed Na 133, Bicarb 18, BUN 62, Cr 4.43 (baseline 0.7), AST 112,  WBC 20.4, lactic acid 2.62, HsT was markedly elevated with a peak at 16,652. Echocardiogram showed an EF 60 to 65%, mild inferolateral hypokinesis normal RV, severely dilated left atrium, and severe mitral regurgitation with a possible A2 partial flail leaflet. Her MR was highly eccentric and posteriorly directed. She was transferred to [facility] for management of her severe mitral regurgitation by the advanced heart failure and structural heart teams.    She was intubated given acute hypoxemic respiratory failure on BiPAP and need for TEE. Additionally, an IABP and Swan catheter were placed. Right heart catheterization showed elevated filling pressures with a high PCWP. Surprisingly, she did not have significant V waves. LHC deferred given AKI. TEE showed severe, eccentric posteriorly-directed mitral regurgitation with prolapse/partial flail of the A2 segment of the anterior leaflet. There was also restriction of the posterior leaflet. Given severe left atrial dilation, it was suspected that she had had some degree of MR prior to her acute NSTEMI. It was felt the event was complicated/worsened by lateral wall motion abnormality with restriction of the posterior leaflet causing acute worsening of the mitral regurgitation and eventual cardiogenic shock. TEE did not show definite evidence for papillary muscle rupture.     The multidisciplinary structural heart team was consulted for consideration of MitraClip  and she was ultimately underwent successful mitral valve transcatheter edge-to-edge repair with an NTW clip reducing her severe mitral regurgitation to moderate mitral regurgitation and no further pulmonary vein flow reversal. Post operative echocardiogram showed an LVEF at 60-65%, RV normal, MV repair s/p Mitraclip with moderate MR and stable clip placement. She was weaned off IABP support and transferred out of ICU. PT/OT recommended SNF for rehab and the patient was discharged to [facility] on  08/08/21. She did have an episode of transient atrial fibrillation and was treated with Amiodarone which has since been discontinued. She remains on Eliquis 2.5mg  BID and tolerates this well.   She was seen by [name] 08/15/21 at which time she had complaints of fatigue. She was unable to stand for weight checks however had no c/o SOB. [name] felt she was doing well considering. Amio was discontinued due to transient AF and was started on Zoloft for depression.   She presents today with her husband and son. She looks great and states that she is feeling well. She continues to work with PT at rehab and feels her symptoms have improved. She is ready to transition back to her home ([facility]). She denies symptoms of chest pain, SOB, LE edema, orthopnea, bleeding in stools or urine, no dizziness, or syncope. We discussed the need for SBE and Azithromycin was Rxs due to PCN allergy. Echo today shows LVEF at 55-60%, no RWMA, stable Mitraclip placement with moderate MR with a MV peak at 9.12mmHg and mean at 4.97mmHg. Son is asking about antiplatelet/anticoagulation plan. Will plan to discuss with MD however given transient AF can consider ZIO and if no AF recurrence, can consider stopping Eliquis and starting ASA. She has had no palpitations.   Past Medical History:  Diagnosis Date   Cervical spondylosis    Cervicogenic headache    Right occipital neuralgia   Depression    Gait disorder    Hyperlipidemia    Hypertension    Normal pressure hydrocephalus (HCC)    Occipital neuralgia 04/17/2013   right   Osteomyelitis of left knee region [facility])    Rheumatoid arthritis(714.0)    Vertigo 02/19/2015    Past Surgical History:  Procedure Laterality Date   CERVICAL SPINE SURGERY     IABP INSERTION N/A 07/31/2021   Procedure: IABP INSERTION;  Surgeon: Larey Dresser, MD;  Location: [place] CV LAB;  Service: Cardiovascular;  Laterality: N/A;   JOINT REPLACEMENT     LUMBAR SPINE SURGERY      MITRAL VALVE REPAIR N/A 08/04/2021   Procedure: MITRAL VALVE REPAIR;  Surgeon: Early Osmond, MD;  Location: [place] [facility];  Service: Cardiovascular;  Laterality: N/A;   OVARY SURGERY     REPLACEMENT TOTAL KNEE Left    TEE WITHOUT CARDIOVERSION N/A 08/04/2021   Procedure: TRANSESOPHAGEAL ECHOCARDIOGRAM (TEE);  Surgeon: Early Osmond, MD;  Location: [place] CV [facility];  Service: Cardiovascular;  Laterality: N/A;   TOTAL KNEE ARTHROPLASTY Right 04/29/2020   Procedure: TOTAL KNEE ARTHROPLASTY;  Surgeon: [name], MD;  Location: [facility] OR[facility];  Service: Orthopedics;  Laterality: Right;  61min   VENTRICULO-PERITONEAL SHUNT PLACEMENT / LAPAROSCOPIC INSERTION PERITONEAL CATHETER      Current Medications: Current Meds  Medication Sig   acetaminophen (TYLENOL) 500 MG tablet Take 1,000 mg by mouth 3 (three) times daily as needed for moderate pain.   acetaminophen-codeine (TYLENOL #3) 300-30 MG tablet Take 1 tablet by mouth every 8 (eight) hours as needed for moderate pain.  alendronate (FOSAMAX) 70 MG tablet Take 70 mg by mouth once a week.   amLODipine (NORVASC) 10 MG tablet Take 10 mg by mouth daily.   apixaban (ELIQUIS) 2.5 MG TABS tablet Take 1 tablet (2.5 mg total) by mouth 2 (two) times daily.   atorvastatin (LIPITOR) 80 MG tablet Take 1 tablet (80 mg total) by mouth daily.   azithromycin (ZITHROMAX) 500 MG tablet Take 1 tablet (500 mg total) by mouth as needed (DENTAL PROCEDURES).   carvedilol (COREG) 6.25 MG tablet Take 1 tablet (6.25 mg total) by mouth 2 (two) times daily with a meal.   furosemide (LASIX) 40 MG tablet Take 1 tablet (40 mg total) by mouth daily.   latanoprost (XALATAN) 0.005 % ophthalmic solution Place 1 drop into both eyes at bedtime.    leflunomide (ARAVA) 10 MG tablet Take 10 mg by mouth daily.   levofloxacin (LEVAQUIN) 250 MG tablet Given one dose on 08/10/21 and another dose on 08/12/21   meclizine (ANTIVERT) 25 MG tablet Take 1 tablet (25 mg total) by mouth  3 (three) times daily as needed for dizziness.   Multiple Vitamin (MULTIVITAMIN) tablet Take 1 tablet by mouth daily.   ondansetron (ZOFRAN) 4 MG tablet Take 1 tablet (4 mg total) by mouth every 6 (six) hours.   potassium chloride SA (KLOR-CON) 20 MEQ tablet Take 1 tablet (20 mEq total) by mouth daily.   predniSONE (DELTASONE) 1 MG tablet Take 2 mg by mouth daily.   sertraline (ZOLOFT) 50 MG tablet Take 25mg (1/2 tablet) by mouth daily for 1 week then increase to 50mg (1 tablet) by mouth daily.   traZODone (DESYREL) 50 MG tablet Take 1 tablet (50 mg total) by mouth at bedtime.     Allergies:   Methotrexate derivatives, Penicillins, and Sulfa antibiotics   Social History   Socioeconomic History   Marital status: Married    Spouse name: Not on file   Number of children: 2   Years of education: 16   Highest education level: Not on file  Occupational History   Occupation: Retired    Fish farm manager: RETIRED  Tobacco Use   Smoking status: Former   Smokeless tobacco: Never  Scientific laboratory technician Use: Never used  Substance and Sexual Activity   Alcohol use: Not Currently   Drug use: No   Sexual activity: Not on file  Other Topics Concern   Not on file  Social History Narrative   Patient lives at home with her husband.    Patient has 2 children.    Patient has 12 + years of school.    Patient is left handed.   Patient drinks about 2 cups of caffeine daily.   Social Determinants of Health   Financial Resource Strain: Not on file  Food Insecurity: Not on file  Transportation Needs: Not on file  Physical Activity: Not on file  Stress: Not on file  Social Connections: Not on file     Family History: The patient's family history includes Brain cancer in her sister; Heart disease in her father; Ovarian cancer in her mother.  ROS:   Please see the history of present illness.    All other systems reviewed and are negative.  EKGs/Labs/Other Studies Reviewed:    The following studies  were reviewed today:  Echocardiogram 09/01/21:   1. Small area of inferior basal hypokinesis . Left ventricular ejection  fraction, by estimation, is 55 to 60%. The left ventricle has normal  function. The left ventricle has no  regional wall motion abnormalities.  There is mild asymmetric left  ventricular hypertrophy of the basal and septal segments. Left ventricular  diastolic parameters are indeterminate.   2. Right ventricular systolic function is normal. The right ventricular  size is normal. There is normal pulmonary artery systolic pressure.   3. Left atrial size was moderately dilated.   4. Residaul ASD through transeptal puncture site with only left to right  shunting . Evidence of atrial level shunting detected by color flow  Doppler.   5. Post NTW mitral clip lateral aspect of A2/P2 moderate posterioraly  directed residual MR lateral to clip. Mean gradient stable 4 mmHg at HR 68  bpm. The mitral valve has been repaired/replaced. Moderate mitral valve  regurgitation. No evidence of mitral   stenosis.   6. Tricuspid valve regurgitation is moderate.   7. The aortic valve is tricuspid. There is mild calcification of the  aortic valve. Aortic valve regurgitation is not visualized. Aortic valve  sclerosis is present, with no evidence of aortic valve stenosis.   8. The inferior vena cava is normal in size with greater than 50%  respiratory variability, suggesting right atrial pressure of 3 mmHg.   Comparison(s): 08/06/21 EF 55-60%.   Echo 07/31/21 1. Left ventricular ejection fraction, by estimation, is 60 to 65%. The  left ventricle has normal function. The left ventricle demonstrates  regional wall motion abnormalities with mid inferolateral hypokinesis.  Left ventricular diastolic parameters are  consistent with Grade II diastolic dysfunction (pseudonormalization).   2. Right ventricular systolic function is normal. The right ventricular  size is normal. There is severely  elevated pulmonary artery systolic  pressure. The estimated right ventricular systolic pressure is 29.5 mmHg.   3. Left atrial size was severely dilated.   4. Right atrial size was mildly dilated.   5. The mitral valve is abnormal. Severe mitral valve regurgitation. MR is  highly eccentric and posteriorly directed. In the parasternal long axis  view, I question possible A2 partial flail. There is splay artifact noted.  PISA ERO 0.39 cm^2 (not likely  to be completely accurate as off axis). No evidence of mitral stenosis.   6. The aortic valve is tricuspid. Aortic valve regurgitation is not  visualized. No aortic stenosis is present.   7. The inferior vena cava is dilated in size with <50% respiratory  variability, suggesting right atrial pressure of 15 mmHg.    _____________________________   TEE 07/31/21 IMPRESSIONS  1. Left ventricular ejection fraction, by estimation, is 55%. The left  ventricle has normal function. The left ventricle demonstrates regional  wall motion abnormalities with hypokinesis of the mid lateral wall.   2. Right ventricular systolic function is normal. The right ventricular  size is normal.   3. The mitral valve is abnormal. Severe mitral valve regurgitation. There  was prolapse/partial flail of the A2 segment as well as posterior leaflet  restriction likely from the lateral wall hypokinesis. Suspect combination  of infarct-related MR and MR due to partial flail A2 with eccentric posteriorly-directed jet. I was worried about rupture of papillary muscle but no definite evidence for this. The dilated left atrium suggests that there was some pre-existing mitral regurgitation that may have  become acutely worse with MI involving inferolateral wall. No evidence of  mitral stenosis. The mean mitral valve gradient is 3.0 mmHg.   4. Peak RV-RA gradient 26 mmHg.   5. The aortic valve is tricuspid. Aortic valve regurgitation is not  visualized. No aortic stenosis is  present.   6. No ASD or PFO by color doppler.   7. Left atrial size was moderately dilated. No left atrial/left atrial  appendage thrombus was detected.    ____________________   CARDIAC CATHETERIZATION Result Date: 07/31/2021 1. Severe MR with elevated filling pressures. 2. IABP inserted successfully.  Start heparin gtt for IABP.    EKG:  EKG is not  ordered today.   Recent Labs: 07/30/2021: B Natriuretic Peptide 2,001.3; TSH 1.050 08/04/2021: ALT 27 08/07/2021: Magnesium 2.3 08/09/2021: BUN 48; Creatinine, Ser 1.91; Hemoglobin 8.7; Platelets 163; Potassium 3.5; Sodium 138  Recent Lipid Panel    Component Value Date/Time   TRIG 145 08/01/2021 0339   Physical Exam:    VS:  BP (!) 144/78   Pulse 75   Ht 5\' 7"  (1.702 m)   Wt 151 lb 3.2 oz (68.6 kg)   SpO2 95%   BMI 23.68 kg/m     Wt Readings from Last 3 Encounters:  09/01/21 151 lb 3.2 oz (68.6 kg)  08/09/21 154 lb 15.7 oz (70.3 kg)  07/09/21 162 lb (73.5 kg)    General: Elderly, NAD Neck: Negative for carotid bruits. No JVD Lungs:Clear to ausculation bilaterally. No wheezes, rales, or rhonchi. Breathing is unlabored. Cardiovascular: RRR with S1 S2. + MR murmur Extremities: No edema.  Neuro: Alert and oriented. No focal deficits. No facial asymmetry. MAE spontaneously. Psych: Responds to questions appropriately with normal affect.    ASSESSMENT/PLAN:    1. Severe Mitral Regurgitation: TEE performed 07/31/21 with severe, eccentric mitral regurgitation with prolapse/partial flail of the A2 segment of the anterior leaf. Underwent TEER 08/04/2021 with successful repair with single NTW clip. Patient had issues with thrombocytopenia 130>92>77>66>55 during her course therefore DAPT was deferred and she was treated with Eliquis 2.5mg  BID given her history of atrial fibrillation/flutter. Echo today shows LVEF at 55-60%, no RWMA, stable Mitraclip placement with moderate MR with a MV peak at 9.34mmHg and mean at 4.84mmHg. doing well  today with NYHA class II symptoms. Son is asking about antiplatelet/anticoagulation plan. Will plan to discuss with MD however given transient AF can consider ZIO and if no AF recurrence may consider stopping Eliquis and starting ASA. Last platelet count 163. SBE dicussed today with RX for azithromycin 500mg  one hour prior to dental procedures or cleanings. Follows with Dr. Aundra [name] 10/10/21 and will see structural heart 07/2022 with echo prior.    2. Chronic diastolic CHF: Acute symptoms during hospital course felt to be secondary to chronic mitral regurgitation and acute NSTEMI. RHC with elevated PCWP. IABP/swan placed during hospital course. Appears volume stable today. Follows closely with AHF team. Sees Dr. Aundra [name] 10/10/21. GDMT limited by renal function.    3. Atrial fibrillation/flutter: Treated with IV amiodarone and transitioned to PO at d/c. This was discontinued at follow up 08/15/21. Denies palpitations. Continue Eliquis 2.5mg  BID. Deferring antiplatelet therapy given hx of thrombocytopenia. Son asking about long term plan for AC/antiplatelet therapy. Last platelet count 163.    4. HTN: BP stable today. No changes.  5.  Chronic kidney disease stage IV: Baseline in the 1.0 range. Peak creatinine at 4.43 with gradual improvement.   6.  Hx of thrombocytopenia: Stabilized by d/c to 163. Denies issues with bleeding on Eliqiuis. Antiplatelet deferred due to thrombocytopenia.    7. Depression: Started on Zoloft>>denies much change however has only been since 11/4.    8. 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 LHC deferred due to AKI.  No anginal symptoms. Continue current regimen.     Medication Adjustments/Labs and Tests Ordered: Current medicines are reviewed at length with the patient today.  Concerns regarding medicines are outlined above.  Orders Placed This Encounter  Procedures   Basic metabolic panel   CBC   Meds ordered this encounter   Medications   azithromycin (ZITHROMAX) 500 MG tablet    Sig: Take 1 tablet (500 mg total) by mouth as needed (DENTAL PROCEDURES).    Dispense:  2 tablet    Refill:  6    Patient Instructions  Medication Instructions:  Your physician has recommended you make the following change in your medication:   START AZITHROMYCIN 500 MG 1 HOUR BEFORE DENTAL PROCEDURES  *If you need a refill on your cardiac medications before your next appointment, please call your pharmacy*   Lab Work: TODAY: CBC, BMET If you have labs (blood work) drawn today and your tests are completely normal, you will receive your results only by: [place] (if you have MyChart) OR A paper copy in the mail If you have any lab test that is abnormal or we need to change your treatment, we will call you to review the results.   Testing/Procedures: NONE   Follow-Up: At [facility], you and your health needs are our priority.  As part of our continuing mission to provide you with exceptional heart care, we have created designated Provider Care Teams.  These Care Teams include your primary Cardiologist (physician) and Advanced Practice Providers (APPs -  Physician Assistants and Nurse Practitioners) who all work together to provide you with the care you need, when you need it.  We recommend signing up for the patient portal called "MyChart".  Sign up information is provided on this After Visit Summary.  MyChart is used to connect with patients for Virtual Visits (Telemedicine).  Patients are able to view lab/test results, encounter notes, upcoming appointments, etc.  Non-urgent messages can be sent to your provider as well.   To learn more about what you can do with MyChart, go to [email or web address].    Your next appointment:   1 year(s)  The format for your next appointment:   In Person  Provider:   Kathyrn Drown, NP{    Signed, Kathyrn Drown, NP  09/01/2021 3:03 PM    [place]

## 2021-09-01 ENCOUNTER — Encounter: Payer: Self-pay | Admitting: Cardiology

## 2021-09-01 ENCOUNTER — Other Ambulatory Visit: Payer: Self-pay

## 2021-09-01 ENCOUNTER — Ambulatory Visit (INDEPENDENT_AMBULATORY_CARE_PROVIDER_SITE_OTHER): Payer: Medicare Other | Admitting: Cardiology

## 2021-09-01 ENCOUNTER — Ambulatory Visit (HOSPITAL_COMMUNITY): Payer: Medicare Other | Attending: Internal Medicine

## 2021-09-01 VITALS — BP 144/78 | HR 75 | Ht 67.0 in | Wt 151.2 lb

## 2021-09-01 DIAGNOSIS — F4321 Adjustment disorder with depressed mood: Secondary | ICD-10-CM | POA: Diagnosis not present

## 2021-09-01 DIAGNOSIS — I2510 Atherosclerotic heart disease of native coronary artery without angina pectoris: Secondary | ICD-10-CM | POA: Diagnosis not present

## 2021-09-01 DIAGNOSIS — I5032 Chronic diastolic (congestive) heart failure: Secondary | ICD-10-CM

## 2021-09-01 DIAGNOSIS — I480 Paroxysmal atrial fibrillation: Secondary | ICD-10-CM

## 2021-09-01 DIAGNOSIS — I340 Nonrheumatic mitral (valve) insufficiency: Secondary | ICD-10-CM | POA: Insufficient documentation

## 2021-09-01 DIAGNOSIS — D696 Thrombocytopenia, unspecified: Secondary | ICD-10-CM

## 2021-09-01 DIAGNOSIS — I10 Essential (primary) hypertension: Secondary | ICD-10-CM

## 2021-09-01 LAB — ECHOCARDIOGRAM COMPLETE
Area-P 1/2: 1.55 cm2
MV M vel: 5.76 m/s
MV Peak grad: 132.5 mmHg
MV VTI: 2.46 cm2
Radius: 0.7 cm
S' Lateral: 2.85 cm

## 2021-09-01 MED ORDER — AZITHROMYCIN 500 MG PO TABS: 500.0000 mg | ORAL_TABLET | ORAL | 6 refills | Status: DC | PRN

## 2021-09-01 NOTE — Patient Instructions (Addendum)
Medication Instructions:  Your physician has recommended you make the following change in your medication:   START AZITHROMYCIN 500 MG 1 HOUR BEFORE DENTAL PROCEDURES  *If you need a refill on your cardiac medications before your next appointment, please call your pharmacy*   Lab Work: TODAY: CBC, BMET If you have labs (blood work) drawn today and your tests are completely normal, you will receive your results only by: [place] (if you have MyChart) OR A paper copy in the mail If you have any lab test that is abnormal or we need to change your treatment, we will call you to review the results.   Testing/Procedures: NONE   Follow-Up: At [facility], you and your health needs are our priority.  As part of our continuing mission to provide you with exceptional heart care, we have created designated Provider Care Teams.  These Care Teams include your primary Cardiologist (physician) and Advanced Practice Providers (APPs -  Physician Assistants and Nurse Practitioners) who all work together to provide you with the care you need, when you need it.  We recommend signing up for the patient portal called "MyChart".  Sign up information is provided on this After Visit Summary.  MyChart is used to connect with patients for Virtual Visits (Telemedicine).  Patients are able to view lab/test results, encounter notes, upcoming appointments, etc.  Non-urgent messages can be sent to your provider as well.   To learn more about what you can do with MyChart, go to [email or web address].    Your next appointment:   1 year(s)  The format for your next appointment:   In Person  Provider:   Kathyrn Drown, [facility]

## 2021-09-02 LAB — CBC
Hematocrit: 36.7 % (ref 34.0–46.6)
Hemoglobin: 12 g/dL (ref 11.1–15.9)
MCH: 31.3 pg (ref 26.6–33.0)
MCHC: 32.7 g/dL (ref 31.5–35.7)
MCV: 96 fL (ref 79–97)
Platelets: 156 10*3/uL (ref 150–450)
RBC: 3.83 x10E6/uL (ref 3.77–5.28)
RDW: 12.9 % (ref 11.7–15.4)
WBC: 9.8 10*3/uL (ref 3.4–10.8)

## 2021-09-02 LAB — BASIC METABOLIC PANEL
BUN/Creatinine Ratio: 19 (ref 12–28)
BUN: 20 mg/dL (ref 8–27)
CO2: 25 mmol/L (ref 20–29)
Calcium: 9.5 mg/dL (ref 8.7–10.3)
Chloride: 105 mmol/L (ref 96–106)
Creatinine, Ser: 1.06 mg/dL — ABNORMAL HIGH (ref 0.57–1.00)
Glucose: 98 mg/dL (ref 70–99)
Potassium: 4.2 mmol/L (ref 3.5–5.2)
Sodium: 144 mmol/L (ref 134–144)
eGFR: 53 mL/min/{1.73_m2} — ABNORMAL LOW (ref >59)

## 2021-10-10 ENCOUNTER — Encounter (HOSPITAL_COMMUNITY): Payer: Self-pay | Admitting: Cardiology

## 2021-10-10 ENCOUNTER — Ambulatory Visit (HOSPITAL_COMMUNITY)
Admission: RE | Admit: 2021-10-10 | Discharge: 2021-10-10 | Disposition: A | Discharge status: Home / Self Care | Payer: Medicare Other | Source: Ambulatory Visit | Attending: Cardiology | Admitting: Cardiology

## 2021-10-10 ENCOUNTER — Other Ambulatory Visit: Payer: Self-pay

## 2021-10-10 VITALS — BP 112/64 | HR 71 | Wt 156.8 lb

## 2021-10-10 DIAGNOSIS — Z09 Encounter for follow-up examination after completed treatment for conditions other than malignant neoplasm: Secondary | ICD-10-CM | POA: Insufficient documentation

## 2021-10-10 DIAGNOSIS — R5383 Other fatigue: Secondary | ICD-10-CM | POA: Diagnosis present

## 2021-10-10 DIAGNOSIS — N179 Acute kidney failure, unspecified: Secondary | ICD-10-CM | POA: Diagnosis not present

## 2021-10-10 DIAGNOSIS — Z79899 Other long term (current) drug therapy: Secondary | ICD-10-CM | POA: Diagnosis not present

## 2021-10-10 DIAGNOSIS — Z87891 Personal history of nicotine dependence: Secondary | ICD-10-CM | POA: Diagnosis not present

## 2021-10-10 DIAGNOSIS — I480 Paroxysmal atrial fibrillation: Secondary | ICD-10-CM

## 2021-10-10 DIAGNOSIS — I4892 Unspecified atrial flutter: Secondary | ICD-10-CM | POA: Diagnosis not present

## 2021-10-10 DIAGNOSIS — Z7952 Long term (current) use of systemic steroids: Secondary | ICD-10-CM | POA: Diagnosis not present

## 2021-10-10 DIAGNOSIS — I252 Old myocardial infarction: Secondary | ICD-10-CM | POA: Diagnosis not present

## 2021-10-10 DIAGNOSIS — I5031 Acute diastolic (congestive) heart failure: Secondary | ICD-10-CM | POA: Insufficient documentation

## 2021-10-10 DIAGNOSIS — M069 Rheumatoid arthritis, unspecified: Secondary | ICD-10-CM | POA: Diagnosis not present

## 2021-10-10 DIAGNOSIS — R0602 Shortness of breath: Secondary | ICD-10-CM | POA: Insufficient documentation

## 2021-10-10 DIAGNOSIS — R42 Dizziness and giddiness: Secondary | ICD-10-CM | POA: Insufficient documentation

## 2021-10-10 DIAGNOSIS — I340 Nonrheumatic mitral (valve) insufficiency: Secondary | ICD-10-CM | POA: Insufficient documentation

## 2021-10-10 DIAGNOSIS — Z7901 Long term (current) use of anticoagulants: Secondary | ICD-10-CM | POA: Insufficient documentation

## 2021-10-10 DIAGNOSIS — I110 Hypertensive heart disease with heart failure: Secondary | ICD-10-CM | POA: Insufficient documentation

## 2021-10-10 DIAGNOSIS — I2510 Atherosclerotic heart disease of native coronary artery without angina pectoris: Secondary | ICD-10-CM | POA: Insufficient documentation

## 2021-10-10 LAB — TSH: TSH: 1.644 u[IU]/mL (ref 0.350–4.500)

## 2021-10-10 LAB — CBC
HCT: 40.5 % (ref 36.0–46.0)
Hemoglobin: 12.6 g/dL (ref 12.0–15.0)
MCH: 30.7 pg (ref 26.0–34.0)
MCHC: 31.1 g/dL (ref 30.0–36.0)
MCV: 98.5 fL (ref 80.0–100.0)
Platelets: 153 10*3/uL (ref 150–400)
RBC: 4.11 MIL/uL (ref 3.87–5.11)
RDW: 14.1 % (ref 11.5–15.5)
WBC: 6.6 10*3/uL (ref 4.0–10.5)
nRBC: 0 % (ref 0.0–0.2)

## 2021-10-10 LAB — COMPREHENSIVE METABOLIC PANEL
ALT: 21 U/L (ref 0–44)
AST: 27 U/L (ref 15–41)
Albumin: 3.9 g/dL (ref 3.5–5.0)
Alkaline Phosphatase: 65 U/L (ref 38–126)
Anion gap: 12 (ref 5–15)
BUN: 17 mg/dL (ref 8–23)
CO2: 23 mmol/L (ref 22–32)
Calcium: 9.8 mg/dL (ref 8.9–10.3)
Chloride: 106 mmol/L (ref 98–111)
Creatinine, Ser: 1.22 mg/dL — ABNORMAL HIGH (ref 0.44–1.00)
GFR, Estimated: 45 mL/min — ABNORMAL LOW (ref >60)
Glucose, Bld: 107 mg/dL — ABNORMAL HIGH (ref 70–99)
Potassium: 4.3 mmol/L (ref 3.5–5.1)
Sodium: 141 mmol/L (ref 135–145)
Total Bilirubin: 0.7 mg/dL (ref 0.3–1.2)
Total Protein: 7.2 g/dL (ref 6.5–8.1)

## 2021-10-10 LAB — BRAIN NATRIURETIC PEPTIDE: B Natriuretic Peptide: 520.8 pg/mL — ABNORMAL HIGH (ref 0.0–100.0)

## 2021-10-10 MED ORDER — AMLODIPINE BESYLATE 5 MG PO TABS: 5.0000 mg | ORAL_TABLET | Freq: Every day | ORAL | 6 refills | Status: AC

## 2021-10-10 NOTE — Patient Instructions (Signed)
Medication Changes:  DO NOT TAKE AMIODARONE IT IS NOT ON YOUR MEDICATION LIST.  Decrease amlodipine to 5mg  daily  Lab Work:  Labs done today, your results will be available in MyChart, we will contact you for abnormal readings.   Testing/Procedures:  none  Referrals:  none  Special Instructions // Education:  none  Follow-Up in: 6 weeks Tues Feb 1st @ 1.30pm  At the [facility], you and your health needs are our priority. We have a designated team specialized in the treatment of Heart Failure. This Care Team includes your primary Heart Failure Specialized Cardiologist (physician), Advanced Practice Providers (APPs- Physician Assistants and Nurse Practitioners), and Pharmacist who all work together to provide you with the care you need, when you need it.   You may see any of the following providers on your designated Care Team at your next follow up:  Dr Glori Bickers Dr Haynes Kerns, NP Lyda Jester, [place] [facility] [place], [place] Audry Riles, PharmD   Please be sure to bring in all your medications bottles to every appointment.   Need to Contact us:  If you have any questions or concerns before your next appointment please send [place] a message through [place] or call our office at [phone].    TO LEAVE A MESSAGE FOR THE NURSE SELECT OPTION 2, PLEASE LEAVE A MESSAGE INCLUDING: YOUR NAME DATE OF BIRTH CALL BACK NUMBER REASON FOR CALL**this is important as we prioritize the call backs  YOU WILL RECEIVE A CALL BACK THE SAME DAY AS LONG AS YOU CALL BEFORE 4:00 PM

## 2021-10-12 NOTE — Progress Notes (Signed)
PCP: Javier Glazier, MD Cardiology: Dr. Aundra Dubin  [name] is a 81 y.o. 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 PMH h/o normal pressure hydrocephalus s/p shunt, RA and former smoker.    Admitted 07/30/21 with increased shortness breath and hypotension. HS Trop > 16000 and felt to be from out of hospital NSTEMI. Due to respiratory compromised required intubation. Echo showed severe MR, inferolateral wall motion abnormality suggests LCx territory MI. Suspect that there was a component of acute infarct-related mitral regurgitation. Take to cath lab and had IABP placed.  TEE with severe MR. Structural Heart Team evaluated and once optimized had salvage Mitraclip on 08/04/21. Also had atrial flutter . Placed on amiodarone with resolution. Discharged on amio 200 mg daily. Discharged on 08/08/21 to [facility].    She returns for followup of CHF.  She has generalized fatigue.  Ok walking around her house, short of breath walking longer distances.  Occasional lightheadedness with standing.  No chest pain.  No orthopnea/PND.  She is doing PT, working on endurance.   ECG (personally reviewed): NSR, septal Qs  Labs (11/22): K 4.2, creatinine 1.06  PMH:  1. 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   3. Rheumatoid arthritis 4. HTN 5. CAD: Out of hospital NSTEMI in 10/22 with TnI up to 16000.  Inferolateral wall motion abnormality, suspect LCx territory.  No cath with AKI and suspected completed MI.  6. Mitral regurgitation: Severe MR 10/22 admission.  Suspect pre-existing prolapse/flail A2 segment then LCx territory NSTEMI with lateral wall motion abnormality and posterior leaflet restriction causing acute worsening of MR.  - Mitraclip in 10/22.  - Echo (11/22): EF 55-60%, small area of basal inferolateral hypokinesis, RV normal, small residual ASD from septal puncture, A2/P2 Mitraclip with moderate residual MR and mean gradient 4 mmHg, moderate  TR, IVC normal.   Social History   Socioeconomic History   Marital status: Married    Spouse name: Not on file   Number of children: 2   Years of education: 16   Highest education level: Not on file  Occupational History   Occupation: Retired    Fish farm manager: RETIRED  Tobacco Use   Smoking status: Former   Smokeless tobacco: Never  Scientific laboratory technician Use: Never used  Substance and Sexual Activity   Alcohol use: Not Currently   Drug use: No   Sexual activity: Not on file  Other Topics Concern   Not on file  Social History Narrative   Patient lives at home with her husband.    Patient has 2 children.    Patient has 12 + years of school.    Patient is left handed.   Patient drinks about 2 cups of caffeine daily.   Social Determinants of Health   Financial Resource Strain: Not on file  Food Insecurity: Not on file  Transportation Needs: Not on file  Physical Activity: Not on file  Stress: Not on file  Social Connections: Not on file  Intimate Partner Violence: Not on file   Family History  Problem Relation Age of Onset   Ovarian cancer Mother    Heart disease Father    Brain cancer Sister    ROS: All systems reviewed and negative except as per HPI.   Current Outpatient Medications  Medication Sig Dispense Refill   acetaminophen (TYLENOL) 500 MG tablet Take 1,000 mg by mouth 3 (three) times daily as needed for moderate pain.  acetaminophen-codeine (TYLENOL #3) 300-30 MG tablet Take 1 tablet by mouth every 8 (eight) hours as needed for moderate pain.     alendronate (FOSAMAX) 70 MG tablet Take 70 mg by mouth once a week.     apixaban (ELIQUIS) 2.5 MG TABS tablet Take 1 tablet (2.5 mg total) by mouth 2 (two) times daily. 60 tablet 11   atorvastatin (LIPITOR) 80 MG tablet Take 1 tablet (80 mg total) by mouth daily. 30 tablet 6   carvedilol (COREG) 6.25 MG tablet Take 1 tablet (6.25 mg total) by mouth 2 (two) times daily with a meal. 60 tablet 6   furosemide (LASIX) 40  MG tablet Take 1 tablet (40 mg total) by mouth daily. 30 tablet 6   latanoprost (XALATAN) 0.005 % ophthalmic solution Place 1 drop into both eyes at bedtime.      leflunomide (ARAVA) 10 MG tablet Take 10 mg by mouth daily.     meclizine (ANTIVERT) 25 MG tablet Take 1 tablet (25 mg total) by mouth 3 (three) times daily as needed for dizziness. 12 tablet 0   Multiple Vitamin (MULTIVITAMIN) tablet Take 1 tablet by mouth daily.     ondansetron (ZOFRAN) 4 MG tablet Take 1 tablet (4 mg total) by mouth every 6 (six) hours. 12 tablet 0   potassium chloride SA (KLOR-CON) 20 MEQ tablet Take 1 tablet (20 mEq total) by mouth daily. 30 tablet 6   predniSONE (DELTASONE) 1 MG tablet Take 2 mg by mouth daily.     sertraline (ZOLOFT) 50 MG tablet Take 25mg (1/2 tablet) by mouth daily for 1 week then increase to 50mg (1 tablet) by mouth daily. 30 tablet 11   traZODone (DESYREL) 50 MG tablet Take 1 tablet (50 mg total) by mouth at bedtime. 90 tablet 3   amLODipine (NORVASC) 5 MG tablet Take 1 tablet (5 mg total) by mouth daily. 30 tablet 6   No current facility-administered medications for this encounter.   BP 112/64    Pulse 71    Wt 71.1 kg (156 lb 12.8 oz)    SpO2 95%    BMI 24.56 kg/m  General: NAD Neck: No JVD, no thyromegaly or thyroid nodule.  Lungs: Clear to auscultation bilaterally with normal respiratory effort. CV: Nondisplaced PMI.  Heart regular S1/S2, no S3/S4, 2/6 HSM apex.  No peripheral edema.  No carotid bruit.  Normal pedal pulses.  Abdomen: Soft, nontender, no hepatosplenomegaly, no distention.  Skin: Intact without lesions or rashes.  Neurologic: Alert and oriented x 3.  Psych: Normal affect. Extremities: No clubbing or cyanosis.  HEENT: Normal. ]  Assessment/Plan: 1. Atrial flutter: Paroxysmal, seen during 10/22 admission and started on amiodarone.  She was supposed to stop amiodarone at last appointment but thinks she is still taking it.  - Continue apixaban.  - Stop amiodarone.  2.  CAD: Suspected inferolateral MI in 10/22 with LCx territory wall motion abnormalities.  TnI to 16,000. Coronary angiography was not done due to out of hospital, completed MI and AKI.  She has had no chest pain since this admission.  - No ASA given apixaban use.  - Continue atorvastatin 80 mg daily 3. Mitral regurgitation: TEE in 10/22 showed severe, eccentric posteriorly-directed mitral regurgitation.  There was prolapse/partial flail of the A2 segment of the anterior leaflet.  There was also restriction of the posterior leaflet.  The left atrium was moderate to severely dilated, so I think that there was some degree of MR prior to the acute event.  Suspect that  there was pre-existing prolapse/partial flail of A2 and patient then had NSTEMI involving LCx territory, leading to lateral wall motion abnormality and restriction of the posterior leaflet with acute worsening of the mitral regurgitation. In 10/22, she had Mitraclip x 1.  Echo in 11/22 showed Mitraclip with moderate residual MR and mean gradient 4 mmHg (not significant stenosis).  4. Chronic diastolic CHF: Echo in 95/07 showed EF 55-60%, small area of basal inferolateral hypokinesis, RV normal, small residual ASD from septal puncture, A2/P2 Mitraclip with moderate residual MR and mean gradient 4 mmHg, moderate TR, IVC normal. She is not volume overloaded on exam.   - Continue Lasix 40 mg po daily. BMET today.  5. Rheumatoid arthritis: She is on low dose prednisone.  6. HTN: BP not elevated.  - Can decrease amlodipine to 5 mg daily.   - Continue Coreg 6.25 mg bid.   Followup in 6 wks.   Loralie Champagne 10/12/2021

## 2021-11-17 NOTE — Progress Notes (Signed)
PCP: Javier Glazier, MD Cardiology: Dr. Aundra Dubin  Mrs Therien is a 81 y.o. 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 PMH h/o normal pressure hydrocephalus s/p shunt, RA and former smoker.    Admitted 07/30/21 with increased shortness breath and hypotension. HS Trop > 16000 and felt to be from out of hospital NSTEMI. Due to respiratory compromised required intubation. Echo showed severe MR, inferolateral wall motion abnormality suggests LCx territory MI. Suspect that there was a component of acute infarct-related mitral regurgitation. Take to cath lab and had IABP placed.  TEE with severe MR. Structural Heart Team evaluated and once optimized had salvage Mitraclip on 08/04/21. Also had atrial flutter. Placed on amiodarone with resolution. Discharged on amio 200 mg daily. Discharged on 08/08/21 to [facility].   Today she returns for HF follow up with her husband. Main complaint is fatigue, worsens as the day goes on. She has some exertional dyspnea during PT sessions at [facility]. She does PT twice a week. Denies palpitations, abnormal bleeding, CP, dizziness, edema, or PND/Orthopnea. Appetite ok and sleeping well. No fever or chills. Weight stable. Taking all medications.   ECG (personally reviewed): none ordered today.  Labs (11/22): K 4.2, creatinine 1.06 Labs (12/22): K 4.3, creatinine 1.22, normal TSH and LFTs  PMH:  1. 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   3. Rheumatoid arthritis 4. HTN 5. CAD: Out of hospital NSTEMI in 10/22 with TnI up to 16000.  Inferolateral wall motion abnormality, suspect LCx territory.  No cath with AKI and suspected completed MI.  6. Mitral regurgitation: Severe MR 10/22 admission.  Suspect pre-existing prolapse/flail A2 segment then LCx territory NSTEMI with lateral wall motion abnormality and posterior leaflet restriction causing acute worsening of MR.  - Mitraclip in 10/22.  - Echo (11/22): EF  55-60%, small area of basal inferolateral hypokinesis, RV normal, small residual ASD from septal puncture, A2/P2 Mitraclip with moderate residual MR and mean gradient 4 mmHg, moderate TR, IVC normal.   Social History   Socioeconomic History   Marital status: Married    Spouse name: Not on file   Number of children: 2   Years of education: 16   Highest education level: Not on file  Occupational History   Occupation: Retired    Fish farm manager: RETIRED  Tobacco Use   Smoking status: Former   Smokeless tobacco: Never  Scientific laboratory technician Use: Never used  Substance and Sexual Activity   Alcohol use: Not Currently   Drug use: No   Sexual activity: Not on file  Other Topics Concern   Not on file  Social History Narrative   Patient lives at home with her husband.    Patient has 2 children.    Patient has 12 + years of school.    Patient is left handed.   Patient drinks about 2 cups of caffeine daily.   Social Determinants of Health   Financial Resource Strain: Not on file  Food Insecurity: Not on file  Transportation Needs: Not on file  Physical Activity: Not on file  Stress: Not on file  Social Connections: Not on file  Intimate Partner Violence: Not on file   Family History  Problem Relation Age of Onset   Ovarian cancer Mother    Heart disease Father    Brain cancer Sister    ROS: All systems reviewed and negative except as per HPI.   Current Outpatient Medications  Medication Sig Dispense Refill  acetaminophen (TYLENOL) 500 MG tablet Take 1,000 mg by mouth 3 (three) times daily as needed for moderate pain.     acetaminophen-codeine (TYLENOL #3) 300-30 MG tablet Take 1 tablet by mouth every 8 (eight) hours as needed for moderate pain.     alendronate (FOSAMAX) 70 MG tablet Take 70 mg by mouth once a week.     amLODipine (NORVASC) 5 MG tablet Take 1 tablet (5 mg total) by mouth daily. 30 tablet 6   apixaban (ELIQUIS) 2.5 MG TABS tablet Take 1 tablet (2.5 mg total) by mouth  2 (two) times daily. 60 tablet 11   atorvastatin (LIPITOR) 80 MG tablet Take 1 tablet (80 mg total) by mouth daily. 30 tablet 6   carvedilol (COREG) 6.25 MG tablet Take 1 tablet (6.25 mg total) by mouth 2 (two) times daily with a meal. 60 tablet 6   furosemide (LASIX) 40 MG tablet Take 1 tablet (40 mg total) by mouth daily. 30 tablet 6   latanoprost (XALATAN) 0.005 % ophthalmic solution Place 1 drop into both eyes at bedtime.      leflunomide (ARAVA) 10 MG tablet Take 10 mg by mouth daily.     meclizine (ANTIVERT) 25 MG tablet Take 1 tablet (25 mg total) by mouth 3 (three) times daily as needed for dizziness. 12 tablet 0   Multiple Vitamin (MULTIVITAMIN) tablet Take 1 tablet by mouth daily.     ondansetron (ZOFRAN) 4 MG tablet Take 1 tablet (4 mg total) by mouth every 6 (six) hours. 12 tablet 0   potassium chloride SA (KLOR-CON) 20 MEQ tablet Take 1 tablet (20 mEq total) by mouth daily. 30 tablet 6   predniSONE (DELTASONE) 1 MG tablet Take 2 mg by mouth daily.     sertraline (ZOLOFT) 50 MG tablet Take 50 mg by mouth daily.     traZODone (DESYREL) 50 MG tablet Take 1 tablet (50 mg total) by mouth at bedtime. 90 tablet 3   No current facility-administered medications for this encounter.   BP (!) 144/78    Pulse 72    Wt 69.1 kg (152 lb 6.4 oz)    SpO2 96%    BMI 23.87 kg/m  General:  NAD. No resp difficulty HEENT: Normal Neck: Supple. No JVD. Carotids 2+ bilat; no bruits. No lymphadenopathy or thryomegaly appreciated. Cor: PMI nondisplaced. Regular rate & rhythm. No rubs, gallops, 2/6 HSM apex Lungs: Clear Abdomen: Soft, nontender, nondistended. No hepatosplenomegaly. No bruits or masses. Good bowel sounds. Extremities: No cyanosis, clubbing, rash, edema Neuro: Alert & oriented x 3, cranial nerves grossly intact. Moves all 4 extremities w/o difficulty. Affect pleasant.  Assessment/Plan: 1. Atrial flutter: Paroxysmal, seen during 10/22 admission and started on amiodarone.  Now off amiodarone  and regular on exam today. - CHA2DS2-VASc Score = 6 - Continue apixaban. No bleeding issues. CBC 12/22 ok. 2. CAD: Suspected inferolateral MI in 10/22 with LCx territory wall motion abnormalities.  TnI to 16,000. Coronary angiography was not done due to out of hospital, completed MI and AKI.  She has had no chest pain since this admission.  - No ASA given apixaban use.  - Continue atorvastatin 80 mg daily. 3. Mitral regurgitation: TEE in 10/22 showed severe, eccentric posteriorly-directed mitral regurgitation.  There was prolapse/partial flail of the A2 segment of the anterior leaflet.  There was also restriction of the posterior leaflet.  The left atrium was moderate to severely dilated, so I think that there was some degree of MR prior to the acute  event.  Suspect that there was pre-existing prolapse/partial flail of A2 and patient then had NSTEMI involving LCx territory, leading to lateral wall motion abnormality and restriction of the posterior leaflet with acute worsening of the mitral regurgitation. In 10/22, she had Mitraclip x 1.  Echo in 11/22 showed Mitraclip with moderate residual MR and mean gradient 4 mmHg (not significant stenosis).  4. Chronic diastolic CHF: Echo in 84/83 showed EF 55-60%, small area of basal inferolateral hypokinesis, RV normal, small residual ASD from septal puncture, A2/P2 Mitraclip with moderate residual MR and mean gradient 4 mmHg, moderate TR, IVC normal. She is not volume overloaded on exam.  NYHA II-early III, fatigue is main limiter. - Continue Lasix 40 mg daily. BMET today.  5. Rheumatoid arthritis: She is on low dose prednisone.  6. HTN: Mildly elevated today. No changes to meds today. - Continue amlodipine 5 mg daily.   - Continue Coreg 6.25 mg bid. Will not increase further with fatigue. 7. Fatigue: Likely multi-factorial (physical deconditioning, depression, dementia, ? Nutrient/mineral deficiency). She endorses her mood has not improved after initiation of  sertraline. Advised follow up with PCP.  Followup in 3 months with Dr. Aundra Dubin.  [place] FNP 11/18/2021

## 2021-11-18 ENCOUNTER — Encounter (HOSPITAL_COMMUNITY): Payer: Self-pay

## 2021-11-18 ENCOUNTER — Ambulatory Visit (HOSPITAL_COMMUNITY)
Admission: RE | Admit: 2021-11-18 | Discharge: 2021-11-18 | Disposition: A | Discharge status: Home / Self Care | Payer: Medicare Other | Source: Ambulatory Visit | Attending: Family Medicine | Admitting: Family Medicine

## 2021-11-18 ENCOUNTER — Other Ambulatory Visit: Payer: Self-pay

## 2021-11-18 VITALS — BP 144/78 | HR 72 | Wt 152.4 lb

## 2021-11-18 DIAGNOSIS — I480 Paroxysmal atrial fibrillation: Secondary | ICD-10-CM

## 2021-11-18 DIAGNOSIS — M069 Rheumatoid arthritis, unspecified: Secondary | ICD-10-CM | POA: Diagnosis not present

## 2021-11-18 DIAGNOSIS — I340 Nonrheumatic mitral (valve) insufficiency: Secondary | ICD-10-CM | POA: Diagnosis not present

## 2021-11-18 DIAGNOSIS — Z79899 Other long term (current) drug therapy: Secondary | ICD-10-CM | POA: Insufficient documentation

## 2021-11-18 DIAGNOSIS — I252 Old myocardial infarction: Secondary | ICD-10-CM | POA: Diagnosis not present

## 2021-11-18 DIAGNOSIS — I2510 Atherosclerotic heart disease of native coronary artery without angina pectoris: Secondary | ICD-10-CM | POA: Diagnosis not present

## 2021-11-18 DIAGNOSIS — F4321 Adjustment disorder with depressed mood: Secondary | ICD-10-CM

## 2021-11-18 DIAGNOSIS — I10 Essential (primary) hypertension: Secondary | ICD-10-CM

## 2021-11-18 DIAGNOSIS — Z87891 Personal history of nicotine dependence: Secondary | ICD-10-CM | POA: Diagnosis not present

## 2021-11-18 DIAGNOSIS — R5383 Other fatigue: Secondary | ICD-10-CM

## 2021-11-18 DIAGNOSIS — I5032 Chronic diastolic (congestive) heart failure: Secondary | ICD-10-CM

## 2021-11-18 DIAGNOSIS — I4892 Unspecified atrial flutter: Secondary | ICD-10-CM | POA: Insufficient documentation

## 2021-11-18 DIAGNOSIS — Z7952 Long term (current) use of systemic steroids: Secondary | ICD-10-CM | POA: Diagnosis not present

## 2021-11-18 DIAGNOSIS — Z7901 Long term (current) use of anticoagulants: Secondary | ICD-10-CM | POA: Insufficient documentation

## 2021-11-18 DIAGNOSIS — I110 Hypertensive heart disease with heart failure: Secondary | ICD-10-CM | POA: Diagnosis not present

## 2021-11-18 LAB — BASIC METABOLIC PANEL
Anion gap: 9 (ref 5–15)
BUN: 19 mg/dL (ref 8–23)
CO2: 23 mmol/L (ref 22–32)
Calcium: 9.4 mg/dL (ref 8.9–10.3)
Chloride: 110 mmol/L (ref 98–111)
Creatinine, Ser: 1.22 mg/dL — ABNORMAL HIGH (ref 0.44–1.00)
GFR, Estimated: 45 mL/min — ABNORMAL LOW (ref >60)
Glucose, Bld: 120 mg/dL — ABNORMAL HIGH (ref 70–99)
Potassium: 3.5 mmol/L (ref 3.5–5.1)
Sodium: 142 mmol/L (ref 135–145)

## 2021-11-18 NOTE — Patient Instructions (Signed)
It was great to see you today! ?No medication changes are needed at this time. ? ? ?Labs today ?We will only contact you if something comes back abnormal or we need to make some changes. ?Otherwise no news is good news! ? ? ?Your physician recommends that you schedule a follow-up appointment in: 3 months with Dr. McLean ? ?Do the following things EVERYDAY: ?Weigh yourself in the morning before breakfast. Write it down and keep it in a log. ?Take your medicines as prescribed ?Eat low salt foods--Limit salt (sodium) to 2000 mg per day.  ?Stay as active as you can everyday ?Limit all fluids for the day to less than 2 liters ? ?At the [facility], you and your health needs are our priority. As part of our continuing mission to provide you with exceptional heart care, we have created designated Provider Care Teams. These Care Teams include your primary Cardiologist (physician) and Advanced Practice Providers (APPs- Physician Assistants and Nurse Practitioners) who all work together to provide you with the care you need, when you need it.  ? ?You may see any of the following providers on your designated Care Team at your next follow up: ?Dr Daniel Bensimhon ?Dr Dalton McLean ?Amy Clegg, NP ?Brittainy Simmons, PA ?Jessica Milford,NP ?Lindsay Finch, PA ?Lauren Kemp, PharmD ? ? ?Please be sure to bring in all your medications bottles to every appointment.  ? ?If you have any questions or concerns before your next appointment please send us a message through mychart or call our office at [phone].   ? ?TO LEAVE A MESSAGE FOR THE NURSE SELECT OPTION 2, PLEASE LEAVE A MESSAGE INCLUDING: ?YOUR NAME ?DATE OF BIRTH ?CALL BACK NUMBER ?REASON FOR CALL**this is important as we prioritize the call backs ? ?YOU WILL RECEIVE A CALL BACK THE SAME DAY AS LONG AS YOU CALL BEFORE 4:00 PM ? ? ?

## 2021-11-21 ENCOUNTER — Telehealth (HOSPITAL_COMMUNITY): Payer: Self-pay

## 2021-11-21 MED ORDER — POTASSIUM CHLORIDE CRYS ER 20 MEQ PO TBCR: 20.0000 meq | EXTENDED_RELEASE_TABLET | Freq: Two times a day (BID) | ORAL | 6 refills | Status: AC

## 2021-11-21 NOTE — Telephone Encounter (Addendum)
Pt aware, agreeable, and verbalized understanding  Script updated to reflect change.  ----- Message from Rafael Bihari, FNP sent at 11/18/2021  4:56 PM EST ----- K on low end of normal. Please ensure she is taking her daily 20 KCL. If she is taking this regularly, she needs to increase to 20 KCL bid.

## 2021-12-01 ENCOUNTER — Other Ambulatory Visit: Payer: Self-pay | Admitting: *Deleted

## 2021-12-01 MED ORDER — TRAZODONE HCL 50 MG PO TABS: 50.0000 mg | ORAL_TABLET | Freq: Every day | ORAL | 1 refills | Status: AC

## 2022-02-17 ENCOUNTER — Encounter (HOSPITAL_COMMUNITY): Payer: Self-pay | Admitting: Cardiology

## 2022-02-17 ENCOUNTER — Ambulatory Visit (HOSPITAL_COMMUNITY)
Admission: RE | Admit: 2022-02-17 | Discharge: 2022-02-17 | Disposition: A | Discharge status: Home / Self Care | Payer: Medicare Other | Source: Ambulatory Visit | Attending: Cardiology | Admitting: Cardiology

## 2022-02-17 VITALS — BP 130/70 | HR 72 | Wt 149.2 lb

## 2022-02-17 DIAGNOSIS — Z87891 Personal history of nicotine dependence: Secondary | ICD-10-CM | POA: Insufficient documentation

## 2022-02-17 DIAGNOSIS — I340 Nonrheumatic mitral (valve) insufficiency: Secondary | ICD-10-CM | POA: Diagnosis not present

## 2022-02-17 DIAGNOSIS — I5032 Chronic diastolic (congestive) heart failure: Secondary | ICD-10-CM | POA: Insufficient documentation

## 2022-02-17 DIAGNOSIS — Z79899 Other long term (current) drug therapy: Secondary | ICD-10-CM | POA: Insufficient documentation

## 2022-02-17 DIAGNOSIS — I4892 Unspecified atrial flutter: Secondary | ICD-10-CM | POA: Diagnosis present

## 2022-02-17 DIAGNOSIS — I480 Paroxysmal atrial fibrillation: Secondary | ICD-10-CM

## 2022-02-17 DIAGNOSIS — Z982 Presence of cerebrospinal fluid drainage device: Secondary | ICD-10-CM | POA: Diagnosis not present

## 2022-02-17 DIAGNOSIS — M069 Rheumatoid arthritis, unspecified: Secondary | ICD-10-CM | POA: Insufficient documentation

## 2022-02-17 DIAGNOSIS — Z7901 Long term (current) use of anticoagulants: Secondary | ICD-10-CM | POA: Diagnosis not present

## 2022-02-17 DIAGNOSIS — I2510 Atherosclerotic heart disease of native coronary artery without angina pectoris: Secondary | ICD-10-CM | POA: Diagnosis not present

## 2022-02-17 DIAGNOSIS — Z7952 Long term (current) use of systemic steroids: Secondary | ICD-10-CM | POA: Insufficient documentation

## 2022-02-17 DIAGNOSIS — I110 Hypertensive heart disease with heart failure: Secondary | ICD-10-CM | POA: Diagnosis not present

## 2022-02-17 DIAGNOSIS — G912 (Idiopathic) normal pressure hydrocephalus: Secondary | ICD-10-CM | POA: Diagnosis not present

## 2022-02-17 LAB — BASIC METABOLIC PANEL
Anion gap: 7 (ref 5–15)
BUN: 22 mg/dL (ref 8–23)
CO2: 26 mmol/L (ref 22–32)
Calcium: 9 mg/dL (ref 8.9–10.3)
Chloride: 104 mmol/L (ref 98–111)
Creatinine, Ser: 1.17 mg/dL — ABNORMAL HIGH (ref 0.44–1.00)
GFR, Estimated: 47 mL/min — ABNORMAL LOW (ref >60)
Glucose, Bld: 130 mg/dL — ABNORMAL HIGH (ref 70–99)
Potassium: 3.8 mmol/L (ref 3.5–5.1)
Sodium: 137 mmol/L (ref 135–145)

## 2022-02-17 LAB — IRON AND TIBC
Iron: 95 ug/dL (ref 28–170)
Saturation Ratios: 29 % (ref 10.4–31.8)
TIBC: 325 ug/dL (ref 250–450)
UIBC: 230 ug/dL

## 2022-02-17 LAB — FERRITIN: Ferritin: 51 ng/mL (ref 11–307)

## 2022-02-17 NOTE — Patient Instructions (Signed)
There has been no changes to your medications.  Labs done today, your results will be available in MyChart, we will contact you for abnormal readings.  Your physician has requested that you have an echocardiogram. Echocardiography is a painless test that uses sound waves to create images of your heart. It provides your doctor with information about the size and shape of your heart and how well your heart's chambers and valves are working. This procedure takes approximately one hour. There are no restrictions for this procedure.  Your physician recommends that you schedule a follow-up appointment in: 4 months.  If you have any questions or concerns before your next appointment please send us a message through mychart or call our office at [phone].    TO LEAVE A MESSAGE FOR THE NURSE SELECT OPTION 2, PLEASE LEAVE A MESSAGE INCLUDING: YOUR NAME DATE OF BIRTH CALL BACK NUMBER REASON FOR CALL**this is important as we prioritize the call backs  YOU WILL RECEIVE A CALL BACK THE SAME DAY AS LONG AS YOU CALL BEFORE 4:00 PM  At the [facility], you and your health needs are our priority. As part of our continuing mission to provide you with exceptional heart care, we have created designated Provider Care Teams. These Care Teams include your primary Cardiologist (physician) and Advanced Practice Providers (APPs- Physician Assistants and Nurse Practitioners) who all work together to provide you with the care you need, when you need it.   You may see any of the following providers on your designated Care Team at your next follow up: Dr Daniel Bensimhon Dr Dalton McLean Amy Clegg, NP Brittainy Simmons, PA Jessica Milford,NP Lindsay Finch, PA Lauren Kemp, PharmD   Please be sure to bring in all your medications bottles to every appointment.    

## 2022-02-17 NOTE — Progress Notes (Signed)
PCP: Javier Glazier, MD ?Cardiology: Dr. Aundra Dubin ? ?[name] is a 81 y.o. 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 PMH h/o normal pressure hydrocephalus s/p shunt, RA and former smoker.  ?  ?Admitted 07/30/21 with increased shortness breath and hypotension. HS Trop > 16000 and felt to be from out of hospital NSTEMI. Due to respiratory compromised required intubation. Echo showed severe MR, inferolateral wall motion abnormality suggests LCx territory MI. Suspect that there was a component of acute infarct-related mitral regurgitation. Take to cath lab and had IABP placed.  TEE with severe MR. Structural Heart Team evaluated and once optimized had salvage Mitraclip on 08/04/21. Also had atrial flutter . Placed on amiodarone with resolution. Discharged on amio 200 mg daily. Discharged on 08/08/21 to [facility].  ?  ?She returns for followup of CHF.  She uses a walker for balance.  No dyspnea walking on flat ground.  No lightheadedness/palpitations.  No chest pain.  Weight is down 3 lbs.  She has generalized fatigue.  ? ?ECG (personally reviewed): NSR, PAC ? ?Labs (11/22): K 4.2, creatinine 1.06 ?Labs (4/23): K 3.7, creatinine 1.24, LFTs normal, TSH normal, hgb 11.9 ? ?PMH:  ?1. 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   ?3. Rheumatoid arthritis ?4. HTN ?5. CAD: Out of hospital NSTEMI in 10/22 with TnI up to 16000.  Inferolateral wall motion abnormality, suspect LCx territory.  No cath with AKI and suspected completed MI.  ?6. Mitral regurgitation: Severe MR 10/22 admission.  Suspect pre-existing prolapse/flail A2 segment then LCx territory NSTEMI with lateral wall motion abnormality and posterior leaflet restriction causing acute worsening of MR.  ?- Mitraclip in 10/22.  ?- Echo (11/22): EF 55-60%, small area of basal inferolateral hypokinesis, RV normal, small residual ASD from septal puncture, A2/P2 Mitraclip with moderate residual MR and mean gradient  4 mmHg, moderate TR, IVC normal.  ? ?Social History  ? ?Socioeconomic History  ? Marital status: Married  ?  Spouse name: Not on file  ? Number of children: 2  ? Years of education: 52  ? Highest education level: Not on file  ?Occupational History  ? Occupation: Retired  ?  Employer: RETIRED  ?Tobacco Use  ? Smoking status: Former  ? Smokeless tobacco: Never  ?Vaping Use  ? Vaping Use: Never used  ?Substance and Sexual Activity  ? Alcohol use: Not Currently  ? Drug use: No  ? Sexual activity: Not on file  ?Other Topics Concern  ? Not on file  ?Social History Narrative  ? Patient lives at home with her husband.   ? Patient has 2 children.   ? Patient has 12 + years of school.   ? Patient is left handed.  ? Patient drinks about 2 cups of caffeine daily.  ? ?Social Determinants of Health  ? ?Financial Resource Strain: Not on file  ?Food Insecurity: Not on file  ?Transportation Needs: Not on file  ?Physical Activity: Not on file  ?Stress: Not on file  ?Social Connections: Not on file  ?Intimate Partner Violence: Not on file  ? ?Family History  ?Problem Relation Age of Onset  ? Ovarian cancer Mother   ? Heart disease Father   ? Brain cancer Sister   ? ?ROS: All systems reviewed and negative except as per HPI.  ? ?Current Outpatient Medications  ?Medication Sig Dispense Refill  ? acetaminophen (TYLENOL) 500 MG tablet Take 1,000 mg by mouth 3 (three) times daily as needed for  moderate pain.    ? acetaminophen-codeine (TYLENOL #3) 300-30 MG tablet Take 1 tablet by mouth every 8 (eight) hours as needed for moderate pain.    ? alendronate (FOSAMAX) 70 MG tablet Take 70 mg by mouth once a week.    ? amLODipine (NORVASC) 5 MG tablet Take 1 tablet (5 mg total) by mouth daily. 30 tablet 6  ? apixaban (ELIQUIS) 2.5 MG TABS tablet Take 1 tablet (2.5 mg total) by mouth 2 (two) times daily. 60 tablet 11  ? atorvastatin (LIPITOR) 80 MG tablet Take 1 tablet (80 mg total) by mouth daily. 30 tablet 6  ? carvedilol (COREG) 6.25 MG tablet  Take 1 tablet (6.25 mg total) by mouth 2 (two) times daily with a meal. 60 tablet 6  ? furosemide (LASIX) 40 MG tablet Take 1 tablet (40 mg total) by mouth daily. 30 tablet 6  ? latanoprost (XALATAN) 0.005 % ophthalmic solution Place 1 drop into both eyes at bedtime.     ? leflunomide (ARAVA) 10 MG tablet Take 10 mg by mouth daily.    ? meclizine (ANTIVERT) 25 MG tablet Take 1 tablet (25 mg total) by mouth 3 (three) times daily as needed for dizziness. 12 tablet 0  ? Multiple Vitamin (MULTIVITAMIN) tablet Take 1 tablet by mouth daily.    ? ondansetron (ZOFRAN) 4 MG tablet Take 4 mg by mouth as needed for nausea or vomiting.    ? potassium chloride SA (KLOR-CON M) 20 MEQ tablet Take 1 tablet (20 mEq total) by mouth 2 (two) times daily. 60 tablet 6  ? predniSONE (DELTASONE) 1 MG tablet Take 2 mg by mouth daily.    ? sertraline (ZOLOFT) 50 MG tablet Take 50 mg by mouth daily.    ? traZODone (DESYREL) 50 MG tablet Take 1 tablet (50 mg total) by mouth at bedtime. 90 tablet 1  ? ?No current facility-administered medications for this encounter.  ? ?BP 130/70   Pulse 72   Wt 67.7 kg (149 lb 3.2 oz)   SpO2 96%   BMI 23.37 kg/m?  ?General: NAD ?Neck: No JVD, no thyromegaly or thyroid nodule.  ?Lungs: Clear to auscultation bilaterally with normal respiratory effort. ?CV: Nondisplaced PMI.  Heart regular S1/S2, no S3/S4, no murmur.  No peripheral edema.  No carotid bruit.  Normal pedal pulses.  ?Abdomen: Soft, nontender, no hepatosplenomegaly, no distention.  ?Skin: Intact without lesions or rashes.  ?Neurologic: Alert and oriented x 3.  ?Psych: Normal affect. ?Extremities: No clubbing or cyanosis.  ?HEENT: Normal.  ? ?Assessment/Plan: ?1. Atrial flutter: Paroxysmal, seen during 10/22 admission and started on amiodarone.  She is now off amiodarone.   ?- Continue apixaban.  ?2. CAD: Suspected inferolateral MI in 10/22 with LCx territory wall motion abnormalities.  TnI to 16,000. Coronary angiography was not done due to out  of hospital, completed MI and AKI.  She has had no chest pain since this admission.  ?- No ASA given apixaban use.  ?- Continue atorvastatin 80 mg daily ?3. Mitral regurgitation: TEE in 10/22 showed severe, eccentric posteriorly-directed mitral regurgitation.  There was prolapse/partial flail of the A2 segment of the anterior leaflet.  There was also restriction of the posterior leaflet.  The left atrium was moderate to severely dilated, so I think that there was some degree of MR prior to the acute event.  Suspect that there was pre-existing prolapse/partial flail of A2 and patient then had NSTEMI involving LCx territory, leading to lateral wall motion abnormality and restriction  of the posterior leaflet with acute worsening of the mitral regurgitation. In 10/22, she had Mitraclip x 1.  Echo in 11/22 showed Mitraclip with moderate residual MR and mean gradient 4 mmHg (not significant stenosis).  ?- Echo at followup in 4 months to reassess mitral valve.  ?4. Chronic diastolic CHF: Echo in 87/19 showed EF 55-60%, small area of basal inferolateral hypokinesis, RV normal, small residual ASD from septal puncture, A2/P2 Mitraclip with moderate residual MR and mean gradient 4 mmHg, moderate TR, IVC normal. She is not volume overloaded on exam.  NYHA class II symptoms with prominent fatigue.  ?- Continue Lasix 40 mg po daily. BMET today.  ?- Check Fe studies for iron deficiency.  ?5. Rheumatoid arthritis: She is on low dose prednisone.  ?6. HTN: BP not elevated.  ?- Continue amlodipine 5 mg daily.   ?- Continue Coreg 6.25 mg bid.  ? ?Followup in 4 months with echo  ? ?Loralie Champagne ?02/17/2022 ? ?

## 2022-04-22 ENCOUNTER — Other Ambulatory Visit (HOSPITAL_COMMUNITY): Payer: Self-pay | Admitting: *Deleted

## 2022-04-22 MED ORDER — FUROSEMIDE 40 MG PO TABS: 40.0000 mg | ORAL_TABLET | Freq: Every day | ORAL | 6 refills | Status: AC

## 2022-05-27 ENCOUNTER — Other Ambulatory Visit: Payer: Self-pay | Admitting: Neurology

## 2022-06-10 ENCOUNTER — Emergency Department (HOSPITAL_BASED_OUTPATIENT_CLINIC_OR_DEPARTMENT_OTHER): Payer: Medicare Other

## 2022-06-10 ENCOUNTER — Emergency Department (HOSPITAL_BASED_OUTPATIENT_CLINIC_OR_DEPARTMENT_OTHER)
Admission: EM | Admit: 2022-06-10 | Discharge: 2022-06-10 | Disposition: A | Discharge status: Home / Self Care | Payer: Medicare Other | Attending: Emergency Medicine | Admitting: Emergency Medicine

## 2022-06-10 ENCOUNTER — Encounter (HOSPITAL_BASED_OUTPATIENT_CLINIC_OR_DEPARTMENT_OTHER): Payer: Self-pay

## 2022-06-10 DIAGNOSIS — S72112A Displaced fracture of greater trochanter of left femur, initial encounter for closed fracture: Secondary | ICD-10-CM | POA: Diagnosis not present

## 2022-06-10 DIAGNOSIS — Z79899 Other long term (current) drug therapy: Secondary | ICD-10-CM | POA: Insufficient documentation

## 2022-06-10 DIAGNOSIS — N183 Chronic kidney disease, stage 3 unspecified: Secondary | ICD-10-CM | POA: Insufficient documentation

## 2022-06-10 DIAGNOSIS — W19XXXA Unspecified fall, initial encounter: Secondary | ICD-10-CM

## 2022-06-10 DIAGNOSIS — S0990XA Unspecified injury of head, initial encounter: Secondary | ICD-10-CM | POA: Diagnosis not present

## 2022-06-10 DIAGNOSIS — S79912A Unspecified injury of left hip, initial encounter: Secondary | ICD-10-CM | POA: Diagnosis present

## 2022-06-10 DIAGNOSIS — M25552 Pain in left hip: Secondary | ICD-10-CM

## 2022-06-10 DIAGNOSIS — I129 Hypertensive chronic kidney disease with stage 1 through stage 4 chronic kidney disease, or unspecified chronic kidney disease: Secondary | ICD-10-CM | POA: Diagnosis not present

## 2022-06-10 DIAGNOSIS — Z7901 Long term (current) use of anticoagulants: Secondary | ICD-10-CM | POA: Diagnosis not present

## 2022-06-10 MED ORDER — ACETAMINOPHEN 325 MG PO TABS
650.0000 mg | ORAL_TABLET | Freq: Once | ORAL | Status: AC
  Administered 2022-06-10: 650 mg via ORAL
  Filled 2022-06-10: qty 2

## 2022-06-10 MED ORDER — FENTANYL CITRATE PF 50 MCG/ML IJ SOSY
50.0000 ug | PREFILLED_SYRINGE | Freq: Once | INTRAMUSCULAR | Status: AC
  Administered 2022-06-10: 50 ug via INTRAMUSCULAR
  Filled 2022-06-10: qty 1

## 2022-06-10 MED ORDER — TRAMADOL HCL 50 MG PO TABS: 50.0000 mg | ORAL_TABLET | Freq: Four times a day (QID) | ORAL | 0 refills | Status: AC | PRN

## 2022-06-10 NOTE — Discharge Instructions (Addendum)
Today's xray demonstrates old fractures of the left hip. No new fractures. Pain is likely secondary to fall on old fracture. Medication sent to pharmacy for pain control. Please discuss rehabilitation options with your living facility.

## 2022-06-10 NOTE — ED Triage Notes (Addendum)
C/o fall 2 days ago, fell onto left hip. States has been unable to walk much since. States think she hit her head, denies LOC. Takes Eliquis.  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

## 2022-06-10 NOTE — ED Provider Notes (Addendum)
[facility]PART[facility] Provider Note   CSN: [id number] Arrival date & time: 06/10/22  4098     History  Chief Complaint  Patient presents with   Lytle Michaels    [name] is a 81 y.o. female.  Patient is an 81 year old female with pmh of Severe Mitral Regurgitation s/p Transcatheter Mitral Valve Repair,  NSTEMI,  Stage III CKD , Atrial Flutter, and HTN presenting for complaints of hip pain after fall.  Patient states days ago she fell down on her left side injuring her left hip.  States she thinks she hit her head.  Denies LOC.  On Eliquis.  Denies any sensation or motor dysfunction.  Admits to left-sided hip pain with a previous history of left hip replacement.  Denies any open wounds or bleeding.    The history is provided by the patient. No language interpreter was used.  Fall Pertinent negatives include no chest pain, no abdominal pain and no shortness of breath.       Home Medications Prior to Admission medications   Medication Sig Start Date End Date Taking? Authorizing Provider  traMADol (ULTRAM) 50 MG tablet Take 1 tablet (50 mg total) by mouth every 6 (six) hours as needed for severe pain. 10/30/12  Yes Campbell Stall P, DO  acetaminophen (TYLENOL) 500 MG tablet Take 1,000 mg by mouth 3 (three) times daily as needed for moderate pain.    [provider]  acetaminophen-codeine (TYLENOL #3) 300-30 MG tablet Take 1 tablet by mouth every 8 (eight) hours as needed for moderate pain.    [provider]  alendronate (FOSAMAX) 70 MG tablet Take 70 mg by mouth once a week. 01/12/20   [provider]  amLODipine (NORVASC) 5 MG tablet Take 1 tablet (5 mg total) by mouth daily. 10/10/21   Larey Dresser, MD  apixaban (ELIQUIS) 2.5 MG TABS tablet Take 1 tablet (2.5 mg total) by mouth 2 (two) times daily. 08/09/21   Bhagat, Crista Luria, PA  atorvastatin (LIPITOR) 80 MG tablet Take 1 tablet (80 mg total) by mouth daily. 08/10/21    Bhagat, Crista Luria, PA  carvedilol (COREG) 6.25 MG tablet Take 1 tablet (6.25 mg total) by mouth 2 (two) times daily with a meal. 08/09/21   Bhagat, Bhavinkumar, PA  furosemide (LASIX) 40 MG tablet Take 1 tablet (40 mg total) by mouth daily. 04/22/22   Larey Dresser, MD  latanoprost (XALATAN) 0.005 % ophthalmic solution Place 1 drop into both eyes at bedtime.  08/02/17   [provider]  leflunomide (ARAVA) 10 MG tablet Take 10 mg by mouth daily.    [provider]  meclizine (ANTIVERT) 25 MG tablet Take 1 tablet (25 mg total) by mouth 3 (three) times daily as needed for dizziness. 02/18/15   Quintella Reichert, MD  Multiple Vitamin (MULTIVITAMIN) tablet Take 1 tablet by mouth daily.    [provider]  ondansetron (ZOFRAN) 4 MG tablet Take 4 mg by mouth as needed for nausea or vomiting.    [provider]  potassium chloride SA (KLOR-CON M) 20 MEQ tablet Take 1 tablet (20 mEq total) by mouth 2 (two) times daily. 11/21/21   [place], Maricela Bo, FNP  predniSONE (DELTASONE) 1 MG tablet Take 2 mg by mouth daily.    [provider]  sertraline (ZOLOFT) 50 MG tablet Take 50 mg by mouth daily.    [provider]  traZODone (DESYREL) 50 MG tablet Take 1 tablet (50 mg total) by mouth at  bedtime. 12/01/21   Genia Harold, MD      Allergies    Methotrexate derivatives, Penicillins, and Sulfa antibiotics    Review of Systems   Review of Systems  Constitutional:  Negative for chills and fever.  HENT:  Negative for ear pain and sore throat.   Eyes:  Negative for pain and visual disturbance.  Respiratory:  Negative for cough and shortness of breath.   Cardiovascular:  Negative for chest pain and palpitations.  Gastrointestinal:  Negative for abdominal pain and vomiting.  Genitourinary:  Negative for dysuria and hematuria.  Musculoskeletal:  Positive for gait problem. Negative for arthralgias and back pain.  Skin:  Negative for color change and rash.   Neurological:  Negative for seizures and syncope.  All other systems reviewed and are negative.   Physical Exam Updated Vital Signs BP 135/75   Pulse 87   Temp 97.9 F (36.6 C) (Oral)   Resp 20   Ht 5\' 7"  (1.702 m)   Wt 63.5 kg   SpO2 93%   BMI 21.93 kg/m  Physical Exam Vitals and nursing note reviewed.  Constitutional:      General: She is not in acute distress.    Appearance: She is well-developed.  HENT:     Head: Normocephalic and atraumatic.  Eyes:     Conjunctiva/sclera: Conjunctivae normal.  Cardiovascular:     Rate and Rhythm: Normal rate and regular rhythm.     Pulses:          Dorsalis pedis pulses are 2+ on the right side and 2+ on the left side.     Heart sounds: No murmur heard. Pulmonary:     Effort: Pulmonary effort is normal. No respiratory distress.     Breath sounds: Normal breath sounds.  Abdominal:     Palpations: Abdomen is soft.     Tenderness: There is no abdominal tenderness.  Musculoskeletal:        General: No swelling.     Cervical back: Neck supple.     Right hip: Normal.     Left hip: Tenderness and bony tenderness present. No deformity.     Left upper leg: Normal.     Left knee: Normal.     Left lower leg: Normal.       Legs:  Skin:    General: Skin is warm and dry.     Capillary Refill: Capillary refill takes less than 2 seconds.  Neurological:     Mental Status: She is alert. Mental status is at baseline. She is confused.     GCS: GCS eye subscore is 4. GCS verbal subscore is 5. GCS motor subscore is 6.     Cranial Nerves: Cranial nerves 2-12 are intact.     Sensory: Sensation is intact.     Motor: Motor function is intact.  Psychiatric:        Mood and Affect: Mood normal.     ED Results / Procedures / Treatments   Labs (all labs ordered are listed, but only abnormal results are displayed) Labs Reviewed - No data to display  EKG None  Radiology No results found.  Procedures Procedures    Medications Ordered  in ED Medications  acetaminophen (TYLENOL) tablet 650 mg (650 mg Oral Given 06/10/22 1014)  fentaNYL (SUBLIMAZE) injection 50 mcg (50 mcg Intramuscular Given 06/10/22 1014)    ED Course/ Medical Decision Making/ A&P  Medical Decision Making Amount and/or Complexity of Data Reviewed Radiology: ordered.  Risk OTC drugs. Prescription drug management.   18:37 PM 81 year old female presenting for complaints of hip pain after fall.  Patient is alert oriented x3, no acute distress, afebrile, stable vital signs.  Patient has bony tenderness to the greater trochanter of the left leg.  Otherwise neurovascular intact.  Old incision site well-healed.  X-ray demonstrates chronic posttraumatic changes of the right superior and inferior pubic rami as well as left greater trochanter.  No acute displaced fractures.  Severe osteoarthritis.  Patient given medication for pain control.  Recommended for rehabilitation at her SNF.  CT head and neck demonstrates no acute process.  Otherwise alert and oriented with some confusion that family states is her baseline.  Otherwise stable for discharge.  Patient in no distress and overall condition improved here in the ED. Detailed discussions were had with the patient regarding current findings, and need for close f/u with PCP or on call doctor. The patient has been instructed to return immediately if the symptoms worsen in any way for re-evaluation. Patient verbalized understanding and is in agreement with current care plan. All questions answered prior to discharge.         Final Clinical Impression(s) / ED Diagnoses Final diagnoses:  Fall, initial encounter  Pain of left hip  Closed displaced fracture of greater trochanter of left femur, old    Rx / DC Orders ED Discharge Orders          Ordered    traMADol (ULTRAM) 50 MG tablet  Every 6 hours PRN        06/10/22 1042              Lianne Cure, DO 33/43/56 [id number]     Campbell Stall P, DO 83/72/90 [id number]

## 2022-06-22 ENCOUNTER — Other Ambulatory Visit: Payer: Self-pay | Admitting: Physician Assistant

## 2022-06-22 ENCOUNTER — Encounter (HOSPITAL_COMMUNITY): Payer: Medicare Other | Admitting: Cardiology

## 2022-06-22 ENCOUNTER — Ambulatory Visit (HOSPITAL_COMMUNITY): Admission: RE | Admit: 2022-06-22 | Payer: Medicare Other | Source: Ambulatory Visit

## 2022-06-23 NOTE — Telephone Encounter (Signed)
This is a CHF pt 

## 2022-07-02 ENCOUNTER — Telehealth: Payer: Self-pay

## 2022-07-02 NOTE — Telephone Encounter (Signed)
Multiple attempts were made to reach the patient to reschedule her appointment on July 09, 2022. I was able to LVM for both the patients spouse and son, but never received a call back. The appointment was cancelled per Dr Krista Blue.

## 2022-07-09 ENCOUNTER — Ambulatory Visit: Payer: Medicare Other | Admitting: Neurology

## 2022-07-09 ENCOUNTER — Encounter: Payer: Self-pay | Admitting: Neurology

## 2022-07-21 ENCOUNTER — Telehealth: Payer: Self-pay

## 2022-07-21 DIAGNOSIS — I340 Nonrheumatic mitral (valve) insufficiency: Secondary | ICD-10-CM

## 2022-07-21 DIAGNOSIS — I5032 Chronic diastolic (congestive) heart failure: Secondary | ICD-10-CM

## 2022-07-21 DIAGNOSIS — Z9889 Other specified postprocedural states: Secondary | ICD-10-CM

## 2022-07-21 NOTE — Telephone Encounter (Signed)
Called and arranged 1 year MitraClip visits on 08/26/2022.  While the patient's MitraClip was 08/04/2021, she is currently in [facility] re[facility] after falling and breaking her hip.  Offered to call and arrange transportation, but she stated she will arrange transportation with [facility] should she still be there at time of visit. She was grateful for call and agrees with plan.

## 2022-08-26 ENCOUNTER — Ambulatory Visit: Payer: Medicare Other

## 2022-08-26 ENCOUNTER — Ambulatory Visit (HOSPITAL_COMMUNITY): Payer: Medicare Other

## 2022-09-01 ENCOUNTER — Telehealth: Payer: Self-pay

## 2022-09-01 NOTE — Telephone Encounter (Signed)
The patient cancelled her 1 year MitraClip echo and visit because she is still in a rehab facility after a fall.  Called for an update. The patient stated she has no idea when she will be discharged. She will call when she has a discharge date to reschedule echo and office visit. She was grateful for call and agreed with plan.

## 2024-01-11 DEATH — deceased
